# Patient Record
Sex: Female | Born: 1935 | Race: White | Hispanic: No | Smoking: Former smoker
Health system: Southern US, Community
[De-identification: ages and names within clinical notes are randomized; demographics above are authoritative.]

## PROBLEM LIST (undated history)

## (undated) DIAGNOSIS — M1990 Unspecified osteoarthritis, unspecified site: Secondary | ICD-10-CM

## (undated) DIAGNOSIS — H269 Unspecified cataract: Secondary | ICD-10-CM

## (undated) DIAGNOSIS — R040 Epistaxis: Secondary | ICD-10-CM

## (undated) DIAGNOSIS — C801 Malignant (primary) neoplasm, unspecified: Secondary | ICD-10-CM

## (undated) DIAGNOSIS — K284 Chronic or unspecified gastrojejunal ulcer with hemorrhage: Secondary | ICD-10-CM

## (undated) DIAGNOSIS — M79606 Pain in leg, unspecified: Secondary | ICD-10-CM

## (undated) DIAGNOSIS — R413 Other amnesia: Secondary | ICD-10-CM

## (undated) DIAGNOSIS — K745 Biliary cirrhosis, unspecified: Secondary | ICD-10-CM

## (undated) DIAGNOSIS — K274 Chronic or unspecified peptic ulcer, site unspecified, with hemorrhage: Secondary | ICD-10-CM

## (undated) DIAGNOSIS — K219 Gastro-esophageal reflux disease without esophagitis: Secondary | ICD-10-CM

## (undated) DIAGNOSIS — I10 Essential (primary) hypertension: Secondary | ICD-10-CM

## (undated) DIAGNOSIS — I5030 Unspecified diastolic (congestive) heart failure: Secondary | ICD-10-CM

## (undated) DIAGNOSIS — R5383 Other fatigue: Secondary | ICD-10-CM

## (undated) DIAGNOSIS — I712 Thoracic aortic aneurysm, without rupture, unspecified: Secondary | ICD-10-CM

## (undated) DIAGNOSIS — K55039 Acute (reversible) ischemia of large intestine, extent unspecified: Secondary | ICD-10-CM

## (undated) DIAGNOSIS — Z5189 Encounter for other specified aftercare: Secondary | ICD-10-CM

## (undated) DIAGNOSIS — G4733 Obstructive sleep apnea (adult) (pediatric): Secondary | ICD-10-CM

## (undated) DIAGNOSIS — E785 Hyperlipidemia, unspecified: Secondary | ICD-10-CM

## (undated) DIAGNOSIS — S065X9A Traumatic subdural hemorrhage with loss of consciousness of unspecified duration, initial encounter: Secondary | ICD-10-CM

## (undated) DIAGNOSIS — N390 Urinary tract infection, site not specified: Secondary | ICD-10-CM

## (undated) DIAGNOSIS — D649 Anemia, unspecified: Secondary | ICD-10-CM

## (undated) DIAGNOSIS — I714 Abdominal aortic aneurysm, without rupture: Secondary | ICD-10-CM

## (undated) DIAGNOSIS — J189 Pneumonia, unspecified organism: Secondary | ICD-10-CM

## (undated) HISTORY — DX: Other fatigue: R53.83

## (undated) HISTORY — DX: Pain in leg, unspecified: M79.606

## (undated) HISTORY — DX: Gastro-esophageal reflux disease without esophagitis: K21.9

## (undated) HISTORY — DX: Pneumonia, unspecified organism: J18.9

## (undated) HISTORY — DX: Hyperlipidemia, unspecified: E78.5

## (undated) HISTORY — PX: CRANIOTOMY: SHX93

## (undated) HISTORY — DX: Biliary cirrhosis, unspecified: K74.5

## (undated) HISTORY — DX: Epistaxis: R04.0

## (undated) HISTORY — DX: Obstructive sleep apnea (adult) (pediatric): G47.33

## (undated) HISTORY — DX: Abdominal aortic aneurysm, without rupture: I71.4

## (undated) HISTORY — DX: Essential (primary) hypertension: I10

## (undated) HISTORY — PX: CATARACT EXTRACTION: SUR2

## (undated) HISTORY — PX: BREAST BIOPSY: SHX20

## (undated) HISTORY — DX: Chronic or unspecified gastrojejunal ulcer with hemorrhage: K28.4

## (undated) HISTORY — DX: Chronic or unspecified peptic ulcer, site unspecified, with hemorrhage: K27.4

---

## 2006-06-06 ENCOUNTER — Other Ambulatory Visit: Admission: RE | Admit: 2006-06-06 | Discharge: 2006-06-06 | Payer: Self-pay | Admitting: Family Medicine

## 2006-06-06 ENCOUNTER — Ambulatory Visit: Payer: Self-pay | Admitting: Family Medicine

## 2006-06-06 ENCOUNTER — Encounter: Payer: Self-pay | Admitting: Family Medicine

## 2006-06-15 ENCOUNTER — Ambulatory Visit: Payer: Self-pay

## 2006-06-15 ENCOUNTER — Encounter: Payer: Self-pay | Admitting: Cardiology

## 2006-06-20 ENCOUNTER — Ambulatory Visit (HOSPITAL_COMMUNITY): Admission: RE | Admit: 2006-06-20 | Discharge: 2006-06-20 | Payer: Self-pay | Admitting: Family Medicine

## 2006-06-22 ENCOUNTER — Encounter: Admission: RE | Admit: 2006-06-22 | Discharge: 2006-06-22 | Payer: Self-pay | Admitting: Family Medicine

## 2006-06-27 ENCOUNTER — Ambulatory Visit: Payer: Self-pay | Admitting: Family Medicine

## 2006-06-27 LAB — CONVERTED CEMR LAB
ALT: 43 units/L — ABNORMAL HIGH (ref 0–40)
AST: 36 units/L (ref 0–37)
Albumin: 3.4 g/dL — ABNORMAL LOW (ref 3.5–5.2)
Alkaline Phosphatase: 215 units/L — ABNORMAL HIGH (ref 39–117)
BUN: 9 mg/dL (ref 6–23)
Basophils Absolute: 0 10*3/uL (ref 0.0–0.1)
Basophils Relative: 0.3 % (ref 0.0–1.0)
CO2: 30 meq/L (ref 19–32)
Chloride: 102 meq/L (ref 96–112)
Creatinine, Ser: 0.8 mg/dL (ref 0.4–1.2)
HCT: 44.5 % (ref 36.0–46.0)
Hemoglobin: 14.9 g/dL (ref 12.0–15.0)
MCHC: 33.6 g/dL (ref 30.0–36.0)
Monocytes Absolute: 0.5 10*3/uL (ref 0.2–0.7)
Monocytes Relative: 8.1 % (ref 3.0–11.0)
RBC: 5.2 M/uL — ABNORMAL HIGH (ref 3.87–5.11)
RDW: 13.3 % (ref 11.5–14.6)
Total Bilirubin: 0.8 mg/dL (ref 0.3–1.2)
Total Protein: 7.6 g/dL (ref 6.0–8.3)

## 2006-07-04 ENCOUNTER — Ambulatory Visit: Payer: Self-pay | Admitting: Family Medicine

## 2006-07-04 LAB — CONVERTED CEMR LAB
AST: 43 units/L — ABNORMAL HIGH (ref 0–37)
Hep A IgM: NEGATIVE
Hep B C IgM: NEGATIVE

## 2006-07-05 ENCOUNTER — Ambulatory Visit: Payer: Self-pay | Admitting: Family Medicine

## 2006-07-13 ENCOUNTER — Ambulatory Visit: Payer: Self-pay | Admitting: Gastroenterology

## 2006-07-19 ENCOUNTER — Ambulatory Visit: Payer: Self-pay | Admitting: Family Medicine

## 2006-07-19 LAB — CONVERTED CEMR LAB
Albumin: 3.3 g/dL — ABNORMAL LOW (ref 3.5–5.2)
Alkaline Phosphatase: 206 units/L — ABNORMAL HIGH (ref 39–117)
GGT: 425 units/L — ABNORMAL HIGH (ref 7–51)

## 2006-07-21 ENCOUNTER — Ambulatory Visit: Payer: Self-pay | Admitting: Family Medicine

## 2006-07-21 ENCOUNTER — Ambulatory Visit: Payer: Self-pay | Admitting: Cardiology

## 2006-08-21 ENCOUNTER — Ambulatory Visit: Payer: Self-pay | Admitting: Vascular Surgery

## 2006-08-21 ENCOUNTER — Encounter: Payer: Self-pay | Admitting: Family Medicine

## 2006-11-06 ENCOUNTER — Ambulatory Visit: Payer: Self-pay | Admitting: Family Medicine

## 2006-11-06 DIAGNOSIS — I714 Abdominal aortic aneurysm, without rupture, unspecified: Secondary | ICD-10-CM | POA: Insufficient documentation

## 2006-11-06 DIAGNOSIS — I10 Essential (primary) hypertension: Secondary | ICD-10-CM | POA: Insufficient documentation

## 2006-11-20 ENCOUNTER — Ambulatory Visit: Payer: Self-pay | Admitting: Family Medicine

## 2006-11-22 LAB — CONVERTED CEMR LAB
BUN: 14 mg/dL (ref 6–23)
CO2: 30 meq/L (ref 19–32)
Calcium: 9.7 mg/dL (ref 8.4–10.5)
Creatinine, Ser: 0.8 mg/dL (ref 0.4–1.2)
GFR calc Af Amer: 90 mL/min

## 2007-02-21 ENCOUNTER — Ambulatory Visit: Payer: Self-pay | Admitting: Family Medicine

## 2007-02-27 ENCOUNTER — Ambulatory Visit: Payer: Self-pay | Admitting: Family Medicine

## 2007-03-02 LAB — CONVERTED CEMR LAB
ALT: 32 units/L (ref 0–35)
AST: 32 units/L (ref 0–37)
Albumin: 3.4 g/dL — ABNORMAL LOW (ref 3.5–5.2)
Alkaline Phosphatase: 180 units/L — ABNORMAL HIGH (ref 39–117)
BUN: 15 mg/dL (ref 6–23)
Basophils Absolute: 0 10*3/uL (ref 0.0–0.1)
Calcium: 9.8 mg/dL (ref 8.4–10.5)
Chloride: 104 meq/L (ref 96–112)
Cholesterol: 228 mg/dL (ref 0–200)
Eosinophils Absolute: 0 10*3/uL (ref 0.0–0.6)
Eosinophils Relative: 0.2 % (ref 0.0–5.0)
GFR calc Af Amer: 78 mL/min
GFR calc non Af Amer: 65 mL/min
Glucose, Bld: 90 mg/dL (ref 70–99)
HDL: 29.3 mg/dL — ABNORMAL LOW (ref >39.0)
MCV: 87.2 fL (ref 78.0–100.0)
Monocytes Relative: 7.4 % (ref 3.0–11.0)
Neutro Abs: 3.7 10*3/uL (ref 1.4–7.7)
Platelets: 211 10*3/uL (ref 150–400)
RBC: 4.81 M/uL (ref 3.87–5.11)
Triglycerides: 175 mg/dL — ABNORMAL HIGH (ref 0–149)
VLDL: 35 mg/dL (ref 0–40)
WBC: 6.9 10*3/uL (ref 4.5–10.5)

## 2007-03-07 ENCOUNTER — Encounter (INDEPENDENT_AMBULATORY_CARE_PROVIDER_SITE_OTHER): Payer: Self-pay | Admitting: *Deleted

## 2007-03-16 ENCOUNTER — Encounter: Payer: Self-pay | Admitting: Family Medicine

## 2007-03-16 ENCOUNTER — Ambulatory Visit: Payer: Self-pay | Admitting: Vascular Surgery

## 2007-06-13 ENCOUNTER — Ambulatory Visit: Payer: Self-pay | Admitting: Family Medicine

## 2007-06-13 DIAGNOSIS — E785 Hyperlipidemia, unspecified: Secondary | ICD-10-CM | POA: Insufficient documentation

## 2007-06-14 ENCOUNTER — Encounter: Payer: Self-pay | Admitting: Family Medicine

## 2007-06-19 ENCOUNTER — Telehealth (INDEPENDENT_AMBULATORY_CARE_PROVIDER_SITE_OTHER): Payer: Self-pay | Admitting: *Deleted

## 2007-06-19 LAB — CONVERTED CEMR LAB
AST: 26 units/L (ref 0–37)
Albumin: 3.7 g/dL (ref 3.5–5.2)
Basophils Absolute: 0 10*3/uL (ref 0.0–0.1)
Bilirubin, Direct: 0.1 mg/dL (ref 0.0–0.3)
Chloride: 106 meq/L (ref 96–112)
Eosinophils Absolute: 0 10*3/uL (ref 0.0–0.6)
Eosinophils Relative: 0.2 % (ref 0.0–5.0)
GFR calc non Af Amer: 74 mL/min
Glucose, Bld: 94 mg/dL (ref 70–99)
HCT: 45.3 % (ref 36.0–46.0)
Hemoglobin: 14.6 g/dL (ref 12.0–15.0)
Lymphocytes Relative: 36.1 % (ref 12.0–46.0)
MCHC: 32.3 g/dL (ref 30.0–36.0)
MCV: 88.8 fL (ref 78.0–100.0)
Monocytes Absolute: 0.6 10*3/uL (ref 0.2–0.7)
Neutrophils Relative %: 55.4 % (ref 43.0–77.0)
Potassium: 4.6 meq/L (ref 3.5–5.1)
RBC: 5.1 M/uL (ref 3.87–5.11)
Sodium: 141 meq/L (ref 135–145)
TSH: 1.58 microintl units/mL (ref 0.35–5.50)
Total Bilirubin: 0.9 mg/dL (ref 0.3–1.2)
WBC: 8.1 10*3/uL (ref 4.5–10.5)

## 2007-06-25 ENCOUNTER — Encounter (INDEPENDENT_AMBULATORY_CARE_PROVIDER_SITE_OTHER): Payer: Self-pay | Admitting: *Deleted

## 2007-07-04 ENCOUNTER — Ambulatory Visit: Payer: Self-pay | Admitting: Family Medicine

## 2007-07-04 DIAGNOSIS — R945 Abnormal results of liver function studies: Secondary | ICD-10-CM | POA: Insufficient documentation

## 2007-07-04 LAB — CONVERTED CEMR LAB
Albumin: 3.5 g/dL (ref 3.5–5.2)
Alkaline Phosphatase: 217 units/L — ABNORMAL HIGH (ref 39–117)
GGT: 327 units/L — ABNORMAL HIGH (ref 7–51)
Total Bilirubin: 0.7 mg/dL (ref 0.3–1.2)

## 2007-07-16 ENCOUNTER — Telehealth (INDEPENDENT_AMBULATORY_CARE_PROVIDER_SITE_OTHER): Payer: Self-pay | Admitting: *Deleted

## 2007-07-30 ENCOUNTER — Ambulatory Visit: Payer: Self-pay | Admitting: Family Medicine

## 2007-08-01 ENCOUNTER — Encounter (INDEPENDENT_AMBULATORY_CARE_PROVIDER_SITE_OTHER): Payer: Self-pay | Admitting: *Deleted

## 2007-08-01 LAB — CONVERTED CEMR LAB
AST: 29 units/L (ref 0–37)
Alkaline Phosphatase: 187 units/L — ABNORMAL HIGH (ref 39–117)
Bilirubin, Direct: 0.1 mg/dL (ref 0.0–0.3)

## 2007-09-25 ENCOUNTER — Ambulatory Visit: Payer: Self-pay | Admitting: Family Medicine

## 2007-09-26 ENCOUNTER — Ambulatory Visit: Payer: Self-pay | Admitting: Family Medicine

## 2007-10-03 LAB — CONVERTED CEMR LAB
AST: 32 units/L (ref 0–37)
Albumin: 3.4 g/dL — ABNORMAL LOW (ref 3.5–5.2)
Alkaline Phosphatase: 203 units/L — ABNORMAL HIGH (ref 39–117)
Bilirubin, Direct: 0.1 mg/dL (ref 0.0–0.3)
Chloride: 103 meq/L (ref 96–112)
GFR calc Af Amer: 78 mL/min
GFR calc non Af Amer: 65 mL/min
HDL: 27.6 mg/dL — ABNORMAL LOW (ref >39.0)
Potassium: 5.2 meq/L — ABNORMAL HIGH (ref 3.5–5.1)
Sodium: 137 meq/L (ref 135–145)
Total Bilirubin: 0.7 mg/dL (ref 0.3–1.2)
VLDL: 26 mg/dL (ref 0–40)

## 2007-10-04 ENCOUNTER — Encounter (INDEPENDENT_AMBULATORY_CARE_PROVIDER_SITE_OTHER): Payer: Self-pay | Admitting: *Deleted

## 2007-10-04 ENCOUNTER — Telehealth (INDEPENDENT_AMBULATORY_CARE_PROVIDER_SITE_OTHER): Payer: Self-pay | Admitting: *Deleted

## 2007-12-05 ENCOUNTER — Ambulatory Visit: Payer: Self-pay | Admitting: Gastroenterology

## 2007-12-05 DIAGNOSIS — R740 Nonspecific elevation of levels of transaminase and lactic acid dehydrogenase [LDH]: Secondary | ICD-10-CM

## 2007-12-05 DIAGNOSIS — J189 Pneumonia, unspecified organism: Secondary | ICD-10-CM | POA: Insufficient documentation

## 2007-12-05 LAB — CONVERTED CEMR LAB
AST: 25 units/L (ref 0–37)
Alkaline Phosphatase: 183 units/L — ABNORMAL HIGH (ref 39–117)
Bilirubin, Direct: 0.1 mg/dL (ref 0.0–0.3)
Ferritin: 131.2 ng/mL (ref 10.0–291.0)
INR: 1 (ref 0.8–1.0)
Iron: 103 ug/dL (ref 42–145)
Total Bilirubin: 0.8 mg/dL (ref 0.3–1.2)

## 2007-12-11 LAB — CONVERTED CEMR LAB
A-1 Antitrypsin, Ser: 202 mg/dL — ABNORMAL HIGH (ref 83–200)
Hep B S Ab: NEGATIVE
Hepatitis B Surface Ag: NEGATIVE

## 2007-12-21 ENCOUNTER — Encounter (INDEPENDENT_AMBULATORY_CARE_PROVIDER_SITE_OTHER): Payer: Self-pay | Admitting: *Deleted

## 2007-12-27 ENCOUNTER — Ambulatory Visit: Payer: Self-pay | Admitting: Gastroenterology

## 2007-12-27 DIAGNOSIS — K219 Gastro-esophageal reflux disease without esophagitis: Secondary | ICD-10-CM | POA: Insufficient documentation

## 2007-12-28 LAB — CONVERTED CEMR LAB: Creatinine, Ser: 0.7 mg/dL (ref 0.4–1.2)

## 2008-01-07 ENCOUNTER — Ambulatory Visit (HOSPITAL_COMMUNITY): Admission: RE | Admit: 2008-01-07 | Discharge: 2008-01-07 | Payer: Self-pay | Admitting: Gastroenterology

## 2008-01-08 ENCOUNTER — Telehealth: Payer: Self-pay | Admitting: Gastroenterology

## 2008-01-09 ENCOUNTER — Ambulatory Visit: Payer: Self-pay | Admitting: Vascular Surgery

## 2008-01-11 ENCOUNTER — Telehealth: Payer: Self-pay | Admitting: Gastroenterology

## 2008-01-18 ENCOUNTER — Ambulatory Visit (HOSPITAL_COMMUNITY): Admission: RE | Admit: 2008-01-18 | Discharge: 2008-01-18 | Payer: Self-pay | Admitting: Gastroenterology

## 2008-01-18 ENCOUNTER — Encounter (INDEPENDENT_AMBULATORY_CARE_PROVIDER_SITE_OTHER): Payer: Self-pay | Admitting: Interventional Radiology

## 2008-01-21 ENCOUNTER — Encounter: Payer: Self-pay | Admitting: Gastroenterology

## 2008-02-07 ENCOUNTER — Telehealth: Payer: Self-pay | Admitting: Gastroenterology

## 2008-02-25 ENCOUNTER — Telehealth (INDEPENDENT_AMBULATORY_CARE_PROVIDER_SITE_OTHER): Payer: Self-pay | Admitting: *Deleted

## 2008-03-05 ENCOUNTER — Encounter: Payer: Self-pay | Admitting: Gastroenterology

## 2008-03-31 ENCOUNTER — Telehealth (INDEPENDENT_AMBULATORY_CARE_PROVIDER_SITE_OTHER): Payer: Self-pay | Admitting: *Deleted

## 2008-04-08 ENCOUNTER — Ambulatory Visit: Payer: Self-pay | Admitting: Family Medicine

## 2008-04-08 DIAGNOSIS — M79609 Pain in unspecified limb: Secondary | ICD-10-CM | POA: Insufficient documentation

## 2008-04-24 LAB — CONVERTED CEMR LAB
Albumin: 3.7 g/dL (ref 3.5–5.2)
Alkaline Phosphatase: 128 units/L — ABNORMAL HIGH (ref 39–117)
BUN: 15 mg/dL (ref 6–23)
Creatinine, Ser: 0.8 mg/dL (ref 0.4–1.2)
Direct LDL: 139.5 mg/dL
GFR calc Af Amer: 90 mL/min
GFR calc non Af Amer: 74 mL/min
HDL: 33 mg/dL — ABNORMAL LOW (ref >39.0)
Potassium: 4.7 meq/L (ref 3.5–5.1)
Total CHOL/HDL Ratio: 7.2
VLDL: 46 mg/dL — ABNORMAL HIGH (ref 0–40)

## 2008-04-25 ENCOUNTER — Telehealth (INDEPENDENT_AMBULATORY_CARE_PROVIDER_SITE_OTHER): Payer: Self-pay | Admitting: *Deleted

## 2008-04-25 ENCOUNTER — Encounter (INDEPENDENT_AMBULATORY_CARE_PROVIDER_SITE_OTHER): Payer: Self-pay | Admitting: *Deleted

## 2008-05-07 ENCOUNTER — Encounter: Payer: Self-pay | Admitting: Gastroenterology

## 2008-05-30 ENCOUNTER — Telehealth (INDEPENDENT_AMBULATORY_CARE_PROVIDER_SITE_OTHER): Payer: Self-pay | Admitting: *Deleted

## 2008-06-19 ENCOUNTER — Encounter (INDEPENDENT_AMBULATORY_CARE_PROVIDER_SITE_OTHER): Payer: Self-pay | Admitting: *Deleted

## 2008-07-23 ENCOUNTER — Ambulatory Visit: Payer: Self-pay | Admitting: Family Medicine

## 2008-07-27 LAB — CONVERTED CEMR LAB
ALT: 17 units/L (ref 0–35)
Direct LDL: 128.8 mg/dL
HDL: 32.8 mg/dL — ABNORMAL LOW (ref >39.00)
Total Protein: 7.3 g/dL (ref 6.0–8.3)
Triglycerides: 151 mg/dL — ABNORMAL HIGH (ref 0.0–149.0)

## 2008-07-28 ENCOUNTER — Encounter (INDEPENDENT_AMBULATORY_CARE_PROVIDER_SITE_OTHER): Payer: Self-pay | Admitting: *Deleted

## 2008-07-28 ENCOUNTER — Telehealth (INDEPENDENT_AMBULATORY_CARE_PROVIDER_SITE_OTHER): Payer: Self-pay | Admitting: *Deleted

## 2008-07-30 ENCOUNTER — Telehealth (INDEPENDENT_AMBULATORY_CARE_PROVIDER_SITE_OTHER): Payer: Self-pay | Admitting: *Deleted

## 2008-08-26 ENCOUNTER — Telehealth (INDEPENDENT_AMBULATORY_CARE_PROVIDER_SITE_OTHER): Payer: Self-pay | Admitting: *Deleted

## 2008-11-05 ENCOUNTER — Encounter: Payer: Self-pay | Admitting: Gastroenterology

## 2008-11-11 ENCOUNTER — Ambulatory Visit: Payer: Self-pay | Admitting: Family Medicine

## 2008-11-14 ENCOUNTER — Telehealth (INDEPENDENT_AMBULATORY_CARE_PROVIDER_SITE_OTHER): Payer: Self-pay | Admitting: *Deleted

## 2008-11-14 ENCOUNTER — Encounter: Payer: Self-pay | Admitting: Family Medicine

## 2008-11-17 ENCOUNTER — Encounter (INDEPENDENT_AMBULATORY_CARE_PROVIDER_SITE_OTHER): Payer: Self-pay | Admitting: *Deleted

## 2008-11-18 ENCOUNTER — Encounter: Admission: RE | Admit: 2008-11-18 | Discharge: 2008-11-18 | Payer: Self-pay | Admitting: Family Medicine

## 2008-11-18 LAB — CONVERTED CEMR LAB
ALT: 20 units/L (ref 0–35)
BUN: 17 mg/dL (ref 6–23)
Bilirubin, Direct: 0 mg/dL (ref 0.0–0.3)
CO2: 28 meq/L (ref 19–32)
Chloride: 105 meq/L (ref 96–112)
Cholesterol: 192 mg/dL (ref 0–200)
Creatinine, Ser: 0.8 mg/dL (ref 0.4–1.2)
Direct LDL: 103.8 mg/dL
Total Protein: 7.6 g/dL (ref 6.0–8.3)

## 2008-11-19 ENCOUNTER — Ambulatory Visit: Payer: Self-pay | Admitting: Pulmonary Disease

## 2008-11-20 ENCOUNTER — Encounter: Payer: Self-pay | Admitting: Pulmonary Disease

## 2008-11-27 ENCOUNTER — Ambulatory Visit (HOSPITAL_BASED_OUTPATIENT_CLINIC_OR_DEPARTMENT_OTHER): Admission: RE | Admit: 2008-11-27 | Discharge: 2008-11-27 | Payer: Self-pay | Admitting: Pulmonary Disease

## 2008-11-27 ENCOUNTER — Encounter: Payer: Self-pay | Admitting: Pulmonary Disease

## 2008-12-14 ENCOUNTER — Ambulatory Visit: Payer: Self-pay | Admitting: Pulmonary Disease

## 2008-12-15 ENCOUNTER — Telehealth (INDEPENDENT_AMBULATORY_CARE_PROVIDER_SITE_OTHER): Payer: Self-pay | Admitting: *Deleted

## 2008-12-19 ENCOUNTER — Ambulatory Visit: Payer: Self-pay | Admitting: Pulmonary Disease

## 2008-12-19 DIAGNOSIS — G4733 Obstructive sleep apnea (adult) (pediatric): Secondary | ICD-10-CM | POA: Insufficient documentation

## 2009-01-09 ENCOUNTER — Ambulatory Visit: Payer: Self-pay | Admitting: Vascular Surgery

## 2009-01-09 ENCOUNTER — Encounter: Payer: Self-pay | Admitting: Family Medicine

## 2009-01-21 ENCOUNTER — Encounter (INDEPENDENT_AMBULATORY_CARE_PROVIDER_SITE_OTHER): Payer: Self-pay | Admitting: *Deleted

## 2009-04-04 DIAGNOSIS — I714 Abdominal aortic aneurysm, without rupture, unspecified: Secondary | ICD-10-CM

## 2009-04-04 HISTORY — DX: Abdominal aortic aneurysm, without rupture: I71.4

## 2009-04-04 HISTORY — DX: Abdominal aortic aneurysm, without rupture, unspecified: I71.40

## 2009-04-04 HISTORY — PX: ILIAC ARTERY - FEMORAL ARTERY BYPASS GRAFT: SUR182

## 2009-06-12 ENCOUNTER — Ambulatory Visit: Payer: Self-pay | Admitting: Family Medicine

## 2009-06-25 LAB — CONVERTED CEMR LAB
Alkaline Phosphatase: 162 units/L — ABNORMAL HIGH (ref 39–117)
Bilirubin, Direct: 0 mg/dL (ref 0.0–0.3)
Cholesterol: 130 mg/dL (ref 0–200)
LDL Cholesterol: 63 mg/dL (ref 0–99)
Total Protein: 8.5 g/dL — ABNORMAL HIGH (ref 6.0–8.3)

## 2009-06-26 ENCOUNTER — Ambulatory Visit: Payer: Self-pay | Admitting: Family Medicine

## 2009-06-26 DIAGNOSIS — R5383 Other fatigue: Secondary | ICD-10-CM

## 2009-06-26 DIAGNOSIS — R040 Epistaxis: Secondary | ICD-10-CM | POA: Insufficient documentation

## 2009-06-26 DIAGNOSIS — R5381 Other malaise: Secondary | ICD-10-CM | POA: Insufficient documentation

## 2009-06-30 LAB — CONVERTED CEMR LAB
Basophils Absolute: 0.1 10*3/uL (ref 0.0–0.1)
CO2: 29 meq/L (ref 19–32)
Calcium: 9.5 mg/dL (ref 8.4–10.5)
Chloride: 104 meq/L (ref 96–112)
Eosinophils Absolute: 0 10*3/uL (ref 0.0–0.7)
Glucose, Bld: 87 mg/dL (ref 70–99)
HCT: 42.1 % (ref 36.0–46.0)
Hemoglobin: 13.7 g/dL (ref 12.0–15.0)
Lymphocytes Relative: 36.3 % (ref 12.0–46.0)
Lymphs Abs: 2.7 10*3/uL (ref 0.7–4.0)
MCHC: 32.5 g/dL (ref 30.0–36.0)
MCV: 89.6 fL (ref 78.0–100.0)
Monocytes Absolute: 0.5 10*3/uL (ref 0.1–1.0)
Neutro Abs: 4.1 10*3/uL (ref 1.4–7.7)
RDW: 13.5 % (ref 11.5–14.6)
Sodium: 142 meq/L (ref 135–145)

## 2009-12-10 ENCOUNTER — Encounter: Payer: Self-pay | Admitting: Family Medicine

## 2010-01-08 ENCOUNTER — Ambulatory Visit: Payer: Self-pay | Admitting: Family Medicine

## 2010-01-08 DIAGNOSIS — K745 Biliary cirrhosis, unspecified: Secondary | ICD-10-CM | POA: Insufficient documentation

## 2010-01-22 ENCOUNTER — Ambulatory Visit: Payer: Self-pay | Admitting: Family Medicine

## 2010-01-26 ENCOUNTER — Encounter: Admission: RE | Admit: 2010-01-26 | Discharge: 2010-01-26 | Payer: Self-pay | Admitting: Vascular Surgery

## 2010-01-26 ENCOUNTER — Ambulatory Visit: Payer: Self-pay | Admitting: Vascular Surgery

## 2010-01-28 ENCOUNTER — Telehealth (INDEPENDENT_AMBULATORY_CARE_PROVIDER_SITE_OTHER): Payer: Self-pay | Admitting: *Deleted

## 2010-01-29 ENCOUNTER — Ambulatory Visit: Payer: Self-pay

## 2010-01-29 ENCOUNTER — Ambulatory Visit: Payer: Self-pay | Admitting: Internal Medicine

## 2010-01-29 ENCOUNTER — Ambulatory Visit (HOSPITAL_COMMUNITY): Admission: RE | Admit: 2010-01-29 | Discharge: 2010-01-29 | Payer: Self-pay | Admitting: Cardiovascular Disease

## 2010-01-29 ENCOUNTER — Ambulatory Visit: Payer: Self-pay | Admitting: Cardiovascular Disease

## 2010-01-29 ENCOUNTER — Telehealth (INDEPENDENT_AMBULATORY_CARE_PROVIDER_SITE_OTHER): Payer: Self-pay | Admitting: *Deleted

## 2010-01-29 DIAGNOSIS — R9431 Abnormal electrocardiogram [ECG] [EKG]: Secondary | ICD-10-CM | POA: Insufficient documentation

## 2010-01-29 DIAGNOSIS — R011 Cardiac murmur, unspecified: Secondary | ICD-10-CM | POA: Insufficient documentation

## 2010-01-29 DIAGNOSIS — F17200 Nicotine dependence, unspecified, uncomplicated: Secondary | ICD-10-CM | POA: Insufficient documentation

## 2010-02-01 ENCOUNTER — Inpatient Hospital Stay (HOSPITAL_COMMUNITY): Admission: RE | Admit: 2010-02-01 | Discharge: 2010-02-05 | Payer: Self-pay | Admitting: Vascular Surgery

## 2010-02-01 ENCOUNTER — Encounter: Payer: Self-pay | Admitting: Vascular Surgery

## 2010-02-01 ENCOUNTER — Ambulatory Visit: Payer: Self-pay | Admitting: Vascular Surgery

## 2010-02-01 HISTORY — PX: ABDOMINAL AORTIC ANEURYSM REPAIR: SUR1152

## 2010-02-03 ENCOUNTER — Telehealth (INDEPENDENT_AMBULATORY_CARE_PROVIDER_SITE_OTHER): Payer: Self-pay | Admitting: *Deleted

## 2010-02-08 ENCOUNTER — Telehealth: Payer: Self-pay | Admitting: Family Medicine

## 2010-02-23 ENCOUNTER — Encounter: Payer: Self-pay | Admitting: Cardiovascular Disease

## 2010-02-23 ENCOUNTER — Ambulatory Visit: Payer: Self-pay | Admitting: Vascular Surgery

## 2010-03-12 ENCOUNTER — Ambulatory Visit: Payer: Self-pay | Admitting: Family Medicine

## 2010-04-23 ENCOUNTER — Ambulatory Visit
Admission: RE | Admit: 2010-04-23 | Discharge: 2010-04-23 | Payer: Self-pay | Source: Home / Self Care | Attending: Cardiovascular Disease | Admitting: Cardiovascular Disease

## 2010-04-25 ENCOUNTER — Encounter: Payer: Self-pay | Admitting: Gastroenterology

## 2010-05-04 NOTE — Progress Notes (Signed)
Summary: lab result/ ov needed  Phone Note Call from Patient Call back at [phone]   Caller: Meredith Pel Summary of Call: Pt daughter called stating that pt just got out of the hospital a few days ago from having surgery on a aneurysm. Pt daughter would like to know how urgent results are and if it is something that needs to be address now or can it wait until pt has recovery  to schedule OV to review. OV was cancelled for tomorrow. Pls advise..........Marland KitchenFelecia Deloach CMA  February 08, 2010 12:20 PM   Follow-up for Phone Call        we need to add zetia 10 mg #30 1 by mouth once daily ---we can leave samples if they like---then I can go over labs in detail when she is able----we will need to recheck labs in 3 months Follow-up by: Loreen Freud DO,  February 08, 2010 5:03 PM  Additional Follow-up for Phone Call Additional follow up Details #1::        pt daughter aware samples placed up front...........Marland KitchenFelecia Deloach CMA  February 09, 2010 10:26 AM     New/Updated Medications: ZETIA 10 MG TABS (EZETIMIBE) Take 1 by mouth once daily Prescriptions: ZETIA 10 MG TABS (EZETIMIBE) Take 1 by mouth once daily  #30 x 0   Entered by:   Jeremy Johann CMA   Authorized by:   Loreen Freud DO   Signed by:   Jeremy Johann CMA on 02/09/2010   Method used:   Samples Given   RxID:   [id number]

## 2010-05-04 NOTE — Progress Notes (Signed)
Summary: Copy of previous results mailed   Phone Note Call from Patient   Caller: Patient Reason for Call: Lab or Test Results Details for Reason: Pt wants previous labs Summary of Call: Mssg from pt stating that she had to cancel her upcoming appt. Wanted to get a copy of her previous labs sent to her home address. Copy of 3/11 labs mailed to pt 01/28/10...Marland KitchenMarland KitchenMarland Kitchen Almeta Monas CMA Duncan Dull)  January 28, 2010 8:12 AM

## 2010-05-04 NOTE — Progress Notes (Signed)
Summary: Pt needs an appt to Review Labs 11/2  Phone Note Outgoing Call   Call placed by: Almeta Monas CMA Duncan Dull),  February 03, 2010 2:32 PM Call placed to: Patient Details for Reason: Pt needs an appt for F/U Summary of Call: This pt needs to have an appt scheduled to have [facility] reviewed.Marland KitchenMarland KitchenMarland KitchenShe needs an appt soon per Dr.Lowne. Left message to call back.... Almeta Monas CMA Duncan Dull)  February 03, 2010 2:33 PM   Called pt and spoke with daughter, pt just got out of the hospital today from having surgery on a aneurysm. Adv she needs to come in for an OV with Dr.Lowne. Scheduled for Tues the 8th @ 8:15.Marland Kitchen... Almeta Monas CMA Duncan Dull)  February 05, 2010 4:51 PM

## 2010-05-04 NOTE — Assessment & Plan Note (Signed)
Summary: SURG CLEARENCE/MT   Referring Provider:  Loreen Freud Primary Provider:  Janit Bern   History of Present Illness: Deanna [name] is seen today at the request of Dr Early.  She has a known AAA that has grown to over 5.5 cm and has been symptomatic the last week with abdomianl pain.  She is scheduled to have surgery Monday.  She denies a history of CAD, SSCP, palpitations or syncope.  I reviewed her ECG from Dr Bosie Helper office, and one from 3/09 and our office today.  There are no changes in over 2 years with poor R wave progression and LAD.  Cannot R/O previous IMI or AMI.  She is active with good funcitonal capacity and no symptoms.  CRF's include smoking, HTN and elevated lipids.  Counseled her on smoking cessation and she surprisingly didn't see the relationship between her smoking and AAA.  She is compliant with her meds.  Her AAA is very worrisome as she is tender and there has been an acute change in size.  She is asymptomatic with good functional capacity with a stable ECG over 2 years.  Ideally she would have a pharmocalogic stress test prior to surgery but this may even be dangerous at this point.  I personally discussed this case with Dr Arbie Cookey, the patient and daughter.  I think the best approach is to try to do a quick echo in the office now and if the EF is normal we will clear her for surgery.  The AAA will not involve the thoracolumbar approach but she is not a candidate for stenting.  No benefit to starting BB this close to surgery.    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) 5)  Fatigue  (ICD-780.79) 6)  Epistaxis  (ICD-784.7) 7)  Obstructive Sleep Apnea  (ICD-327.23) 8)  Leg Pain, Bilateral  (ICD-729.5) 9)  Gerd  (ICD-530.81) 10)  Nonspecific  Abnormal Results Livr Function Study  (ICD-794.8) 11)  Screening Colorectal-cancer  (ICD-V76.51) 12)  Alanine Aminotransferase, Serum, Elevated  (ICD-790.4) 13)  Pneumonia  (ICD-486) 14)  Liver Function Tests, Abnormal  (ICD-794.8) 15)  Preventive Health Care  (ICD-V70.0) 16)  Abdominal Aneurysm Without Mention of Rupture  (ICD-441.4) 17)  Contusion, Face Lip  (ICD-920)  Current Medications (verified): 1)  Norvasc 10 Mg Tabs (Amlodipine Besylate) .... Will Pick Today Takes 1 Tab By Mouth Once Daily 2)  Vit-E .Marland Kitchen.. 1 By Mouth Once Daily 3)  Vit-C .Marland Kitchen.. 1 By Mouth Once Daily 4)  Calcium .Marland Kitchen.. 1 By Mouth Once Daily 5)  Vit-D-3 .Marland Kitchen.. 1 By Mouth Once Daily 6)  Fish Oil .... 2 By Mouth Two Times A Day 7)  Folic Acid .Marland Kitchen.. 1 By Mouth One Time A Day 8)  Co-Q10 .Marland Kitchen.. 1 By Mouth Once Daily 9)  Vit-A .Marland Kitchen.. 1 By Mouth Once Daily 10)  Bayer Low Strength 81 Mg Tbec (Aspirin) .... One Tablet By Mouth Every Day 11)  Vitamin B-12 1000 Mcg Tabs (Cyanocobalamin) .Marland Kitchen.. 1 Once Daily 12)  Cvs Vitamin B-1 100 Mg Tabs (Thiamine Hcl) .... One Tablet By Mouth Once Daily 13)  Fish Oil 1000 Mg Caps (Omega-3 Fatty Acids) .... 2 By Mouth Two Times A Day 14)  Zocor 20 Mg Tabs (Simvastatin) .Marland Kitchen.. 1 By Mouth At Bedtime 15)  Fenofibrate 160  Mg Tabs (Fenofibrate) .... Take 1 By Mouth Once Daily 16)  Zostavax 04540 Unt/0.39ml Solr (Zoster Vaccine Live) .Marland Kitchen.. 1 Ml Im X 1  Allergies (verified): No Known Drug Allergies  Past History:  Past Medical History: Last updated: 01/28/2010 HYPERLIPIDEMIA HYPERTENSION ANEURYSM, ABDOMINAL AORTIC BILIARY CIRRHOSIS, PRIMARY  FATIGUE EPISTAXIS  OBSTRUCTIVE SLEEP APNEA  LEG PAIN, BILATERAL  GERD NONSPECIFIC ABNORMAL RESULTS LIVR FUNCTION STUDY SCREENING COLORECTAL-CANCER  ALANINE AMINOTRANSFERASE, SERUM, ELEVATED PNEUMONIA LIVER FUNCTION TESTS, ABNORMAL  PREVENTIVE HEALTH CARE ABDOMINAL ANEURYSM WITHOUT MENTION OF RUPTURE CONTUSION, FACE LIP  Ulcer with bleed - 2000  Past  Surgical History: Last updated: 2007/12/15 Cataract Extraction OU L breast biopsy-benign 1994  Family History: Last updated: December 15, 2007 Father: DECEASED Mother: DECEASED Siblings: 1 BRO-LIVING, 1 BRO-DECEASED, 1 SIS-LIVING, 1 SIS-DECEASED No FH of Colon Cancer:  Social History: Last updated: 11/19/2008 Married-- widow with children Current Smoke 8 cigs per day started 1960 Alcohol use-no Drug use-no Regular exercise-yes retired IT consultant  Review of Systems       Denies fever, malais, weight loss, blurry vision, decreased visual acuity, cough, sputum, SOB, hemoptysis, pleuritic pain, palpitaitons, heartburn, abdominal pain, melena, lower extremity edema, claudication, or rash.   Vital Signs:  Patient profile:   77 year old female Height:      68 inches Weight:      192 pounds BMI:     29.30 Pulse rate:   82 / minute Resp:     14 per minute BP sitting:   110 / 80  (left arm)  Vitals Entered By: Kem Parkinson (January 29, 2010 4:25 PM)  Physical Exam  General:  Affect appropriate Healthy:  appears stated age HEENT: normal Neck supple with no adenopathy JVP normal no bruits no thyromegaly Lungs clear with no wheezing and good diaphragmatic motion Heart:  S1/S2 SEM mild AR  murmur,rub, gallop or click PMI normal Abdomen: benighn, BS positve, palpable tender AAA no bruit.  No HSM or HJR Distal pulses intact with no bruits No edema Neuro non-focal Skin warm and dry    Impression & Recommendations:  Problem # 1:  ANEURYSM, ABDOMINAL AORTIC (ICD-441.4) Preop clearance for vascular surgery.  Progressive symptomatic AAA takes presidence over asymtomatic abnormal ECG in female with good funcitonal capacity  Echo in office today shows normal EF with no RWMA's  Clear for surgery Monday given the symptomatic nature of her AAA  Problem # 2:  HYPERTENSION (ICD-401.9) Well controlled Her updated medication list for this problem includes:    Norvasc 10 Mg Tabs  (Amlodipine besylate) .Marland Kitchen... Will pick today takes 1 tab by mouth once daily    Bayer Low Strength 81 Mg Tbec (Aspirin) ..... One tablet by mouth every day  Orders: Echocardiogram (Echo)  Problem # 3:  HYPERLIPIDEMIA (ICD-272.4) Continue 2 drug Rx with known vascular disease target LDL under 70 Her updated medication list for this problem includes:    Zocor 20 Mg Tabs (Simvastatin) .Marland Kitchen... 1 by mouth at bedtime    Fenofibrate 160 Mg Tabs (Fenofibrate) .Marland Kitchen... Take 1 by mouth once daily  Problem # 4:  ELECTROCARDIOGRAM, ABNORMAL (ICD-794.31) Check echo today.  R/O RWMA's stable since 06/2007 no acute changes  Reviewed echo just done in office EF normal 60% with abnormal septal motion and no discete RWMA's.  Mild AR and AV slcerosis.   Her updated medication list for this problem includes:    Norvasc 10 Mg Tabs (Amlodipine besylate) .Marland Kitchen... Will pick today takes 1 tab by mouth once daily  Bayer Low Strength 81 Mg Tbec (Aspirin) ..... One tablet by mouth every day  Problem # 5:  SMOKER (ICD-305.1) Hopefully will quit diuring convalescence from AAA.  Risk of cancer and link to vascualr disease discussed.  Counseled for less than 10 minutes  Problem # 6:  MURMUR (ICD-785.2) AV sclerosis and mild AR by limited echo.  F/U in a year  Patient Instructions: 1)  Your physician recommends that you schedule a follow-up appointment in: 3 MONTHS   EKG Report  Procedure date:  01/29/2010  Findings:      NSR 85 LAD IMI AMI No change from 3/09  May be poor R wave progression and LAD

## 2010-05-04 NOTE — Progress Notes (Signed)
Summary: Refill Request  Phone Note Refill Request Message from:  Pharmacy on January 29, 2010 11:17 AM  Refills Requested: Medication #1:  NORVASC 10 MG  TABS 1 by mouth once daily   Dosage confirmed as above?Dosage Confirmed   Brand Name Necessary? No   Supply Requested: 1 month   Last Refilled: 01/08/2010 [facility] [address]  Next Appointment Scheduled: 11.9.11 Initial call taken by: Harold Barban,  January 29, 2010 11:17 AM  Follow-up for Phone Call        duplicate.Marland KitchenMarland KitchenRx sent 01/08/10 with a quantity of 90 and 3 refills by Dr.Lowne Follow-up by: Almeta Monas CMA Duncan Dull),  January 29, 2010 11:53 AM

## 2010-05-04 NOTE — Miscellaneous (Signed)
Summary: [facility]  [facility]   Imported By: Lanelle Bal 12/22/2009 11:19:39  _____________________________________________________________________  External Attachment:    Type:   Image     Comment:   External Document  Appended Document: [facility]    Clinical Lists Changes  Observations: Added new observation of FLU VAX: Fluvax MCR (12/11/2009 22:47)       Immunization History:  Influenza Immunization History:    Influenza:  Fluvax MCR (12/11/2009)

## 2010-05-04 NOTE — Assessment & Plan Note (Signed)
Summary: 2ND HEP B INJ, BP CHECK, 272/4  hep, lipid//[facility]   Vital Signs:  Patient profile:   77 year old female Height:      68 inches Weight:      175.4 pounds BMI:     26.77 Temp:     97.3 degrees F oral BP sitting:   116 / 72  (left arm) Cuff size:   regular  Vitals Entered By: Almeta Monas CMA Duncan Dull) (January 08, 2010 10:46 AM) CC: needs hep vaccine and meds refilled   History of Present Illness:  Hypertension follow-up      This is a 77 year old woman who presents for Hypertension follow-up.  The patient denies lightheadedness, urinary frequency, headaches, edema, impotence, rash, and fatigue.  The patient denies the following associated symptoms: chest pain, chest pressure, exercise intolerance, dyspnea, palpitations, syncope, leg edema, and pedal edema.  Compliance with medications (by patient report) has been near 100%.  The patient reports that dietary compliance has been excellent.  The patient reports no exercise.  Adjunctive measures currently used by the patient include salt restriction.    Hyperlipidemia follow-up      The patient also presents for Hyperlipidemia follow-up.  The patient denies muscle aches, GI upset, abdominal pain, flushing, itching, constipation, diarrhea, and fatigue.  The patient denies the following symptoms: chest pain/pressure, exercise intolerance, dypsnea, palpitations, syncope, and pedal edema.  Compliance with medications (by patient report) has been near 100%.  Dietary compliance has been excellent.  The patient reports no exercise.  Adjunctive measures currently used by the patient include ASA.    Pt c/o occassionally stiffness in legs but it doesn't last long and is not interfering with her day.    Current Medications (verified): 1)  Norvasc 10 Mg  Tabs (Amlodipine Besylate) .Marland Kitchen.. 1 By Mouth Once Daily 2)  Vit-E .Marland Kitchen.. 1 By Mouth Once Daily 3)  Vit-C .Marland Kitchen.. 1 By Mouth Once Daily 4)  Calcium .Marland Kitchen.. 1 By Mouth Once Daily 5)  Vit-D-3 .Marland Kitchen.. 1 By Mouth  Once Daily 6)  Fish Oil .... 2 By Mouth Two Times A Day 7)  Folic Acid .Marland Kitchen.. 1 By Mouth One Time A Day 8)  Co-Q10 .Marland Kitchen.. 1 By Mouth Once Daily 9)  Vit-A .Marland Kitchen.. 1 By Mouth Once Daily 10)  Bayer Low Strength 81 Mg Tbec (Aspirin) .... One Tablet By Mouth Every Day 11)  Vitamin B-12 1000 Mcg Tabs (Cyanocobalamin) .Marland Kitchen.. 1 Once Daily 12)  Cvs Vitamin B-1 100 Mg Tabs (Thiamine Hcl) .... One Tablet By Mouth Once Daily 13)  Ursodiol 500 Mg Tabs (Ursodiol) .... Take 1 Tab Two Times A Day 14)  Fish Oil 1000 Mg Caps (Omega-3 Fatty Acids) .... 2 By Mouth Two Times A Day 15)  Zocor 20 Mg Tabs (Simvastatin) .Marland Kitchen.. 1 By Mouth At Bedtime 16)  Fenofibrate 160 Mg Tabs (Fenofibrate) .... Take 1 By Mouth Once Daily  Allergies (verified): No Known Drug Allergies  Past History:  Past Medical History: Last updated: Dec 14, 2007 Hypertension Hyperlipidemia Abd. aortic aneurysm Ulcer with bleed - 2000  Past Surgical History: Last updated: 12/14/07 Cataract Extraction OU L breast biopsy-benign 1994  Family History: Last updated: Dec 14, 2007 Father: DECEASED Mother: DECEASED Siblings: 1 BRO-LIVING, 1 BRO-DECEASED, 1 SIS-LIVING, 1 SIS-DECEASED No FH of Colon Cancer:  Social History: Last updated: 11/19/2008 Married-- widow with children Current Smoke 8 cigs per day started 1960 Alcohol use-no Drug use-no Regular exercise-yes retired paralegal  Risk Factors: Caffeine Use: 5+ (06/13/2007) Exercise: no (11/11/2008)  Risk Factors: Smoking Status: current (06/13/2007) Packs/Day: 8 CIG QD  (02/21/2007) Passive Smoke Exposure: no (06/13/2007)  Physical Exam  General:  Well-developed,well-nourished,in no acute distress; alert,appropriate and cooperative throughout examination Neck:  No deformities, masses, or tenderness noted. Lungs:  Normal respiratory effort, chest expands symmetrically. Lungs are clear to auscultation, no crackles or wheezes. Heart:  normal rate and no murmur.   Extremities:  No  clubbing, cyanosis, edema, or deformity noted with normal full range of motion of all joints.   Psych:  Cognition and judgment appear intact. Alert and cooperative with normal attention span and concentration. No apparent delusions, illusions, hallucinations   Impression & Recommendations:  Problem # 1:  HYPERLIPIDEMIA (ICD-272.4)  Her updated medication list for this problem includes:    Zocor 20 Mg Tabs (Simvastatin) .Marland Kitchen... 1 by mouth at bedtime    Fenofibrate 160 Mg Tabs (Fenofibrate) .Marland Kitchen... Take 1 by mouth once daily  Orders: Venipuncture (04540) T- * Misc. Laboratory test [phone]) Prescription Created Electronically [phone])  Labs Reviewed: SGOT: 28 (06/12/2009)   SGPT: 26 (06/12/2009)   HDL:39.10 (06/12/2009), 29.50 (11/11/2008)  LDL:63 (06/12/2009), DEL (95/62/1308)  Chol:130 (06/12/2009), 192 (11/11/2008)  Trig:140.0 (06/12/2009), 211.0 (11/11/2008)  Problem # 2:  HYPERTENSION (ICD-401.9)  Her updated medication list for this problem includes:    Norvasc 10 Mg Tabs (Amlodipine besylate) .Marland Kitchen... 1 by mouth once daily  Orders: Venipuncture (65784) T- * Misc. Laboratory test [phone]) Prescription Created Electronically [phone])  BP today: 116/72 Prior BP: 128/82 (06/26/2009)  Labs Reviewed: K+: 4.5 (06/26/2009) Creat: : 0.9 (06/26/2009)   Chol: 130 (06/12/2009)   HDL: 39.10 (06/12/2009)   LDL: 63 (06/12/2009)   TG: 140.0 (06/12/2009)  Problem # 3:  BILIARY CIRRHOSIS, PRIMARY (ICD-571.6)  per [facility]  Orders: Prescription Created Electronically [phone])  Complete Medication List: 1)  Norvasc 10 Mg Tabs (Amlodipine besylate) .Marland Kitchen.. 1 by mouth once daily 2)  Vit-e  .Marland KitchenMarland Kitchen. 1 by mouth once daily 3)  Vit-c  .Marland Kitchen.. 1 by mouth once daily 4)  Calcium  .Marland KitchenMarland Kitchen. 1 by mouth once daily 5)  Vit-d-3  .Marland Kitchen.. 1 by mouth once daily 6)  Fish Oil  .... 2 by mouth two times a day 7)  Folic Acid  .Marland Kitchen.. 1 by mouth one time a day 8)  Co-q10  .Marland KitchenMarland Kitchen. 1 by mouth once daily 9)  Vit-a  .Marland Kitchen.. 1 by mouth once  daily 10)  Bayer Low Strength 81 Mg Tbec (Aspirin) .... One tablet by mouth every day 11)  Vitamin B-12 1000 Mcg Tabs (Cyanocobalamin) .Marland Kitchen.. 1 once daily 12)  Cvs Vitamin B-1 100 Mg Tabs (Thiamine hcl) .... One tablet by mouth once daily 13)  Ursodiol 500 Mg Tabs (Ursodiol) .... Take 1 tab two times a day 14)  Fish Oil 1000 Mg Caps (Omega-3 fatty acids) .... 2 by mouth two times a day 15)  Zocor 20 Mg Tabs (Simvastatin) .Marland Kitchen.. 1 by mouth at bedtime 16)  Fenofibrate 160 Mg Tabs (Fenofibrate) .... Take 1 by mouth once daily 17)  Zostavax 10272 Unt/0.76ml Solr (Zoster vaccine live) .Marland Kitchen.. 1 ml im x 1  Other Orders: Hepatitis B Vaccine >70yrs (53664) Admin 1st Vaccine (40347)  Patient Instructions: 1)  Please schedule a follow-up appointment in 6 months .  2)  Please schedule a follow-up appointment in 2 weeks. --to review labs Prescriptions: ZOSTAVAX [id number] UNT/0.65ML SOLR (ZOSTER VACCINE LIVE) 1 ml IM x 1  #1 x 0   Entered and Authorized by:   Loreen Freud DO  Signed by:   Loreen Freud DO on 01/08/2010   Method used:   Print then Give to Patient   RxID:   [id number] FENOFIBRATE 160 MG TABS (FENOFIBRATE) take 1 by mouth once daily  #90 x 3   Entered and Authorized by:   Loreen Freud DO   Signed by:   Loreen Freud DO on 01/08/2010   Method used:   Electronically to        [facility] [address] (retail)       [phone]       [place], [place]  [address]       Ph: [phone]       Fax: [phone]   RxID:   [id number] ZOCOR 20 MG TABS (SIMVASTATIN) 1 by mouth at bedtime  #90 x 3   Entered and Authorized by:   Loreen Freud DO   Signed by:   Loreen Freud DO on 01/08/2010   Method used:   Electronically to        [facility] [address] (retail)       [phone]       [place], [place]  [address]       Ph: [phone]       Fax: [phone]   RxID:   [id number] NORVASC 10 MG  TABS (AMLODIPINE  BESYLATE) 1 by mouth once daily  #90 x 3   Entered and Authorized by:   Loreen Freud DO   Signed by:   Loreen Freud DO on 01/08/2010   Method used:   Electronically to        [facility] [address] (retail)       [phone]       [place], [place]  [address]       Ph: [phone]       Fax: [phone]   RxID:   [id number]    Orders Added: 1)  Hepatitis B Vaccine >73yrs [90746] 2)  Admin 1st Vaccine [90471] 3)  Venipuncture [93818] 4)  T- * Misc. Laboratory test [99999] 5)  Est. Patient Level III [99213] 6)  Prescription Created Electronically [phone]    Immunizations Administered:  Hepatitis B Vaccine # 2:    Vaccine Type: HepB Adult    Site: left deltoid    Mfr: Merck    Dose: 0.1 ml    Route: IM    Given by: Almeta Monas CMA (AAMA)    Exp. Date: 03/18/2012    Lot #: [id number]    VIS given: 10/19/05 version given January 08, 2010.    Immunizations Administered:  Hepatitis B Vaccine # 2:    Vaccine Type: HepB Adult    Site: left deltoid    Mfr: Merck    Dose: 0.1 ml    Route: IM    Given by: Almeta Monas CMA (AAMA)    Exp. Date: 03/18/2012    Lot #: [id number]    VIS given: 10/19/05 version given January 08, 2010.

## 2010-05-04 NOTE — Assessment & Plan Note (Signed)
Summary: severe nose bleeds/kdc   Vital Signs:  Patient profile:   77 year old female Weight:      190 pounds Pulse rate:   93 / minute Pulse rhythm:   regular BP sitting:   128 / 82  (left arm) Cuff size:   regular  Vitals Entered By: Army Fossa CMA (June 26, 2009 10:48 AM) CC: Pt here for severe nose bleeds since last saturday every day. The longest lasted 50 mins. She has been blowing clots she says.    History of Present Illness: Pt here with daughter c/o frequently nose bleeds.  they started about 1 week ago.  Pt has humidifier in house.  No HA.  Sometimes she gets 2 a day.   Pt has been feeling very tired since they started as well.   Current Medications (verified): 1)  Norvasc 10 Mg  Tabs (Amlodipine Besylate) .Marland Kitchen.. 1 By Mouth Once Daily 2)  Vit-E .Marland Kitchen.. 1 By Mouth Once Daily 3)  Vit-C .Marland Kitchen.. 1 By Mouth Once Daily 4)  Calcium .Marland Kitchen.. 1 By Mouth Once Daily 5)  Vit-D-3 .Marland Kitchen.. 1 By Mouth Once Daily 6)  Fish Oil .... 2 By Mouth Two Times A Day 7)  Folic Acid .Marland Kitchen.. 1 By Mouth One Time A Day 8)  Co-Q10 .Marland Kitchen.. 1 By Mouth Once Daily 9)  Vit-A .Marland Kitchen.. 1 By Mouth Once Daily 10)  Bayer Low Strength 81 Mg Tbec (Aspirin) .... One Tablet By Mouth Every Day 11)  Vitamin B-12 1000 Mcg Tabs (Cyanocobalamin) .Marland Kitchen.. 1 Once Daily 12)  Cvs Vitamin B-1 100 Mg Tabs (Thiamine Hcl) .... One Tablet By Mouth Once Daily 13)  Ursodiol 500 Mg Tabs (Ursodiol) .... Take 1 Tab Two Times A Day 14)  Fish Oil 1000 Mg Caps (Omega-3 Fatty Acids) .... 2 By Mouth Two Times A Day 15)  Zocor 20 Mg Tabs (Simvastatin) .Marland Kitchen.. 1 By Mouth At Bedtime 16)  Fenofibrate 160 Mg Tabs (Fenofibrate) .... Take 1 By Mouth Once Daily  Allergies (verified): No Known Drug Allergies  Past History:  Past medical, surgical, family and social histories (including risk factors) reviewed for relevance to current acute and chronic problems.  Past Medical History: Reviewed history from 12/05/2007 and no changes  required. Hypertension Hyperlipidemia Abd. aortic aneurysm Ulcer with bleed - 2000  Past Surgical History: Reviewed history from 12/05/2007 and no changes required. Cataract Extraction OU L breast biopsy-benign 1994  Family History: Reviewed history from 12/05/2007 and no changes required. Father: DECEASED Mother: DECEASED Siblings: 1 BRO-LIVING, 1 BRO-DECEASED, 1 SIS-LIVING, 1 SIS-DECEASED No FH of Colon Cancer:  Social History: Reviewed history from 11/19/2008 and no changes required. Married-- widow with children Current Smoke 8 cigs per day started 1960 Alcohol use-no Drug use-no Regular exercise-yes retired IT consultant  Review of Systems      See HPI  Physical Exam  General:  Well-developed,well-nourished,in no acute distress; alert,appropriate and cooperative throughout examination Ears:  External ear exam shows no significant lesions or deformities.  Otoscopic examination reveals clear canals, tympanic membranes are intact bilaterally without bulging, retraction, inflammation or discharge. Hearing is grossly normal bilaterally. Nose:  + dry blood L nostril  R normal Mouth:  Oral mucosa and oropharynx without lesions or exudates.  Teeth in good repair. Neck:  No deformities, masses, or tenderness noted. Lungs:  Normal respiratory effort, chest expands symmetrically. Lungs are clear to auscultation, no crackles or wheezes. Heart:  normal rate.   Cervical Nodes:  No lymphadenopathy noted Psych:  Cognition and judgment  appear intact. Alert and cooperative with normal attention span and concentration. No apparent delusions, illusions, hallucinations   Impression & Recommendations:  Problem # 1:  EPISTAXIS (ICD-784.7) pt can use afrin if it occurs again con't with pinching nose and ice if she can not stop bleeding call office or go to ER ENT referral made Orders: Venipuncture (08657) TLB-B12 + Folate Pnl (84696_29528-U13/KGM) TLB-BMP (Basic Metabolic Panel-BMET)  (80048-METABOL) TLB-CBC Platelet - w/Differential (85025-CBCD) TLB-TSH (Thyroid Stimulating Hormone) (01027-OZD) ENT Referral (ENT)  Problem # 2:  FATIGUE (ICD-780.79)  Orders: Venipuncture (66440) TLB-B12 + Folate Pnl ([id number]-O75/IEP) TLB-BMP (Basic Metabolic Panel-BMET) (80048-METABOL) TLB-CBC Platelet - w/Differential (85025-CBCD) TLB-TSH (Thyroid Stimulating Hormone) (84443-TSH)  Complete Medication List: 1)  Norvasc 10 Mg Tabs (Amlodipine besylate) .Marland Kitchen.. 1 by mouth once daily 2)  Vit-e  .Marland KitchenMarland Kitchen. 1 by mouth once daily 3)  Vit-c  .Marland Kitchen.. 1 by mouth once daily 4)  Calcium  .Marland KitchenMarland Kitchen. 1 by mouth once daily 5)  Vit-d-3  .Marland Kitchen.. 1 by mouth once daily 6)  Fish Oil  .... 2 by mouth two times a day 7)  Folic Acid  .Marland Kitchen.. 1 by mouth one time a day 8)  Co-q10  .Marland KitchenMarland Kitchen. 1 by mouth once daily 9)  Vit-a  .Marland Kitchen.. 1 by mouth once daily 10)  Bayer Low Strength 81 Mg Tbec (Aspirin) .... One tablet by mouth every day 11)  Vitamin B-12 1000 Mcg Tabs (Cyanocobalamin) .Marland Kitchen.. 1 once daily 12)  Cvs Vitamin B-1 100 Mg Tabs (Thiamine hcl) .... One tablet by mouth once daily 13)  Ursodiol 500 Mg Tabs (Ursodiol) .... Take 1 tab two times a day 14)  Fish Oil 1000 Mg Caps (Omega-3 fatty acids) .... 2 by mouth two times a day 15)  Zocor 20 Mg Tabs (Simvastatin) .Marland Kitchen.. 1 by mouth at bedtime 16)  Fenofibrate 160 Mg Tabs (Fenofibrate) .... Take 1 by mouth once daily Prescriptions: FENOFIBRATE 160 MG TABS (FENOFIBRATE) take 1 by mouth once daily  #30 x 5   Entered and Authorized by:   Loreen Freud DO   Signed by:   Loreen Freud DO on 06/26/2009   Method used:   Electronically to        [facility] [address] (retail)       [phone]       [place], [place]  [address]       Ph: [phone]       Fax: [phone]   RxID:   [id number] ZOCOR 20 MG TABS (SIMVASTATIN) 1 by mouth at bedtime  #30 Each x 5   Entered and Authorized by:   Loreen Freud DO   Signed by:   Loreen Freud DO on  06/26/2009   Method used:   Electronically to        [facility] [address] (retail)       [phone]       [place], [place]  [address]       Ph: [phone]       Fax: [phone]   RxID:   [id number] NORVASC 10 MG  TABS (AMLODIPINE BESYLATE) 1 by mouth once daily  #30 x 5   Entered and Authorized by:   Loreen Freud DO   Signed by:   Loreen Freud DO on 06/26/2009   Method used:   Electronically to        [facility] [address] (retail)  [address]       [place], [place]  [address]       Ph: [phone]       Fax: [phone]   RxID:   [id number]

## 2010-05-06 NOTE — Assessment & Plan Note (Signed)
Summary: F3M/DM   Referring Provider:  Loreen Freud Primary Provider:  Janit Bern  CC:  dizziness.  History of Present Illness: Deanna [name] is seen today post AAA repair.  Preop echo normal with mild arotic root dilatation.  Post op course unremarkable.  BP borderline on Norvasc  Will monitor at home.  Does not want to be on Zetia.  Causes significant dizzyness.  Reviewed lipomed profile and Zetia started to decrease small particle absorption. Will hold it and if dizzyness resolves just continue fenobibrate and statin.  No SSCP, dyspnea or palpitaiotns.  Has stopped smoking since AAA repair  Enjoys going to [place] to gamble  Current Problems (verified): 1)  Murmur  (ICD-785.2) 2)  Electrocardiogram, Abnormal  (ICD-794.31) 3)  Smoker  (ICD-305.1) 4)  Hyperlipidemia  (ICD-272.4) 5)  Hypertension  (ICD-401.9) 6)  Aneurysm, Abdominal Aortic  (ICD-441.4) 7)  Biliary Cirrhosis, Primary  (ICD-571.6) 8)  Fatigue  (ICD-780.79) 9)  Epistaxis  (ICD-784.7) 10)  Obstructive Sleep Apnea  (ICD-327.23) 11)  Leg Pain, Bilateral  (ICD-729.5) 12)  Gerd  (ICD-530.81) 13)  Nonspecific Abnormal Results Livr Function Study  (ICD-794.8) 14)  Screening Colorectal-cancer  (ICD-V76.51) 15)  Alanine Aminotransferase, Serum, Elevated  (ICD-790.4) 16)  Pneumonia  (ICD-486) 17)  Liver Function Tests, Abnormal  (ICD-794.8) 18)  Preventive Health Care  (ICD-V70.0) 19)  Abdominal Aneurysm Without Mention of Rupture  (ICD-441.4) 20)  Contusion, Face Lip  (ICD-920)  Current Medications (verified): 1)  Norvasc 10 Mg Tabs (Amlodipine Besylate) .... Will Pick Today Takes 1 Tab By Mouth Once Daily 2)  Vit-E .Marland Kitchen.. 1 By Mouth Once Daily 3)  Vit-C .Marland Kitchen.. 1 By Mouth Once Daily 4)  Calcium .Marland Kitchen.. 1 By Mouth Once Daily 5)  Vit-D-3 .Marland Kitchen.. 1 By Mouth Once Daily 6)  Fish Oil .... 2 By Mouth Two Times A Day 7)  Folic Acid .Marland Kitchen.. 1 By Mouth One Time A Day 8)  Co-Q10 .Marland Kitchen.. 1 By Mouth Once Daily 9)  Vit-A .Marland Kitchen.. 1 By Mouth Once  Daily 10)  Bayer Low Strength 81 Mg Tbec (Aspirin) .... One Tablet By Mouth Every Day 11)  Vitamin B-12 1000 Mcg Tabs (Cyanocobalamin) .Marland Kitchen.. 1 Once Daily 12)  Cvs Vitamin B-1 100 Mg Tabs (Thiamine Hcl) .... One Tablet By Mouth Once Daily 13)  Zocor 20 Mg Tabs (Simvastatin) .Marland Kitchen.. 1 By Mouth At Bedtime 14)  Fenofibrate 160 Mg Tabs (Fenofibrate) .... Take 1 By Mouth Once Daily 15)  Niacin .Marland Kitchen.. 1 Tab By Mouth Once Daily  Allergies (verified): No Known Drug Allergies  Past History:  Past Medical History: Last updated: 01/28/2010 HYPERLIPIDEMIA HYPERTENSION ANEURYSM, ABDOMINAL AORTIC BILIARY CIRRHOSIS, PRIMARY  FATIGUE EPISTAXIS  OBSTRUCTIVE SLEEP APNEA  LEG PAIN, BILATERAL  GERD NONSPECIFIC ABNORMAL RESULTS LIVR FUNCTION STUDY SCREENING COLORECTAL-CANCER  ALANINE AMINOTRANSFERASE, SERUM, ELEVATED PNEUMONIA LIVER FUNCTION TESTS, ABNORMAL  PREVENTIVE HEALTH CARE ABDOMINAL ANEURYSM WITHOUT MENTION OF RUPTURE CONTUSION, FACE LIP  Ulcer with bleed - 2000  Past Surgical History: Last updated: 04/23/2010   Aorta bi-common iliac artery bypass with a 20 x 10  Hemashield graft. Larina Earthly, MD        Cataract Extraction OU L breast biopsy-benign 1994  Family History: Last updated: 12-25-07 Father: DECEASED Mother: DECEASED Siblings: 1 BRO-LIVING, 1 BRO-DECEASED, 1 SIS-LIVING, 1 SIS-DECEASED No FH of Colon Cancer:  Social History: Last updated: 11/19/2008 Married-- widow with children Current Smoke 8 cigs per day started 1960 Alcohol use-no Drug use-no Regular exercise-yes retired IT consultant  Review of Systems  Denies fever, malais, weight loss, blurry vision, decreased visual acuity, cough, sputum, SOB, hemoptysis, pleuritic pain, palpitaitons, heartburn, abdominal pain, melena, lower extremity edema, claudication, or rash.   Vital Signs:  Patient profile:   77 year old female Height:      68 inches Weight:      180 pounds BMI:     27.47 Pulse rate:   75 /  minute Resp:     14 per minute BP sitting:   138 / 84  (left arm)  Vitals Entered By: Kem Parkinson (April 23, 2010 11:47 AM)  Physical Exam  General:  Affect appropriate Healthy:  appears stated age HEENT: normal Neck supple with no adenopathy JVP normal no bruits no thyromegaly Lungs clear with no wheezing and good diaphragmatic motion Heart:  S1/S2 no murmur,rub, gallop or click PMI normal Abdomen: benighn, BS positve, no tenderness, no AAA no bruit.  No HSM or HJR Distal pulses intact with no bruits No edema Neuro non-focal Skin warm and dry AAA scar healed well   Impression & Recommendations:  Problem # 1:  HYPERLIPIDEMIA (ICD-272.4) Hold Zetia and see if dizzyness improves The following medications were removed from the medication list:    Zetia 10 Mg Tabs (Ezetimibe) .Marland Kitchen... 1 by mouth once daily Her updated medication list for this problem includes:    Zocor 20 Mg Tabs (Simvastatin) .Marland Kitchen... 1 by mouth at bedtime    Fenofibrate 160 Mg Tabs (Fenofibrate) .Marland Kitchen... Take 1 by mouth once daily  Problem # 2:  HYPERTENSION (ICD-401.9) Home observatin May need to change to ARB and diuretic.  F.U 3 months Her updated medication list for this problem includes:    Norvasc 10 Mg Tabs (Amlodipine besylate) .Marland Kitchen... Will pick today takes 1 tab by mouth once daily    Bayer Low Strength 81 Mg Tbec (Aspirin) ..... One tablet by mouth every day  Problem # 3:  ANEURYSM, ABDOMINAL AORTIC (ICD-441.4) F/U duplex in 3 months.  No apparant complicaitons from surgery  Patient Instructions: 1)  Your physician has recommended you make the following change in your medication:STOP ZETIA  2)  Your physician wants you to follow-up in: 3 MONTHS  You will receive a reminder letter in the mail two months in advance. If you don't receive a letter, please call our office to schedule the follow-up appointment.

## 2010-05-06 NOTE — Letter (Signed)
Summary: [facility] - Office Visit  [facility] - Office Visit   Imported By: Marylou Mccoy 03/12/2010 12:46:27  _____________________________________________________________________  External Attachment:    Type:   Image     Comment:   External Document

## 2010-05-06 NOTE — Assessment & Plan Note (Signed)
Summary: med refill and talk about zetia///sph   Vital Signs:  Patient profile:   77 year old female Weight:      166.8 pounds Temp:     97.1 degrees F oral BP sitting:   112 / 72  (left arm) Cuff size:   regular  Vitals Entered By: Almeta Monas CMA Duncan Dull) (March 12, 2010 9:31 AM) CC: wants to discuss meds//review [facility]   History of Present Illness: pt hee to review labs.    Current Medications (verified): 1)  Norvasc 10 Mg Tabs (Amlodipine Besylate) .... Will Pick Today Takes 1 Tab By Mouth Once Daily 2)  Vit-E .Marland Kitchen.. 1 By Mouth Once Daily 3)  Vit-C .Marland Kitchen.. 1 By Mouth Once Daily 4)  Calcium .Marland Kitchen.. 1 By Mouth Once Daily 5)  Vit-D-3 .Marland Kitchen.. 1 By Mouth Once Daily 6)  Fish Oil .... 2 By Mouth Two Times A Day 7)  Folic Acid .Marland Kitchen.. 1 By Mouth One Time A Day 8)  Co-Q10 .Marland Kitchen.. 1 By Mouth Once Daily 9)  Vit-A .Marland Kitchen.. 1 By Mouth Once Daily 10)  Bayer Low Strength 81 Mg Tbec (Aspirin) .... One Tablet By Mouth Every Day 11)  Vitamin B-12 1000 Mcg Tabs (Cyanocobalamin) .Marland Kitchen.. 1 Once Daily 12)  Cvs Vitamin B-1 100 Mg Tabs (Thiamine Hcl) .... One Tablet By Mouth Once Daily 13)  Zocor 20 Mg Tabs (Simvastatin) .Marland Kitchen.. 1 By Mouth At Bedtime 14)  Fenofibrate 160 Mg Tabs (Fenofibrate) .... Take 1 By Mouth Once Daily  Allergies (verified): No Known Drug Allergies  Physical Exam  General:  Well-developed,well-nourished,in no acute distress; alert,appropriate and cooperative throughout examination Psych:  Cognition and judgment appear intact. Alert and cooperative with normal attention span and concentration. No apparent delusions, illusions, hallucinations   Impression & Recommendations:  Problem # 1:  HYPERLIPIDEMIA (ICD-272.4)  The following medications were removed from the medication list:    Zetia 10 Mg Tabs (Ezetimibe) .Marland Kitchen... Take 1 by mouth once daily Her updated medication list for this problem includes:    Zocor 20 Mg Tabs (Simvastatin) .Marland Kitchen... 1 by mouth at bedtime    Fenofibrate 160  Mg Tabs (Fenofibrate) .Marland Kitchen... Take 1 by mouth once daily    Zetia 10 Mg Tabs (Ezetimibe) .Marland Kitchen... 1 by mouth once daily  Labs Reviewed: SGOT: 28 (06/12/2009)   SGPT: 26 (06/12/2009)   HDL:39.10 (06/12/2009), 29.50 (11/11/2008)  LDL:63 (06/12/2009), DEL (91/47/8295)  Chol:130 (06/12/2009), 192 (11/11/2008)  Trig:140.0 (06/12/2009), 211.0 (11/11/2008)  Problem # 2:  HYPERTENSION (ICD-401.9)  Her updated medication list for this problem includes:    Norvasc 10 Mg Tabs (Amlodipine besylate) .Marland Kitchen... Will pick today takes 1 tab by mouth once daily  BP today: 112/72 Prior BP: 110/80 (01/29/2010)  Labs Reviewed: K+: 4.5 (06/26/2009) Creat: : 0.9 (06/26/2009)   Chol: 130 (06/12/2009)   HDL: 39.10 (06/12/2009)   LDL: 63 (06/12/2009)   TG: 140.0 (06/12/2009)  Complete Medication List: 1)  Norvasc 10 Mg Tabs (Amlodipine besylate) .... Will pick today takes 1 tab by mouth once daily 2)  Vit-e  .Marland KitchenMarland Kitchen. 1 by mouth once daily 3)  Vit-c  .Marland Kitchen.. 1 by mouth once daily 4)  Calcium  .Marland KitchenMarland Kitchen. 1 by mouth once daily 5)  Vit-d-3  .Marland Kitchen.. 1 by mouth once daily 6)  Fish Oil  .... 2 by mouth two times a day 7)  Folic Acid  .Marland Kitchen.. 1 by mouth one time a day 8)  Co-q10  .Marland KitchenMarland Kitchen. 1 by mouth once daily 9)  Vit-a  .Marland KitchenMarland KitchenMarland Kitchen  1 by mouth once daily 10)  Bayer Low Strength 81 Mg Tbec (Aspirin) .... One tablet by mouth every day 11)  Vitamin B-12 1000 Mcg Tabs (Cyanocobalamin) .Marland Kitchen.. 1 once daily 12)  Cvs Vitamin B-1 100 Mg Tabs (Thiamine hcl) .... One tablet by mouth once daily 13)  Zocor 20 Mg Tabs (Simvastatin) .Marland Kitchen.. 1 by mouth at bedtime 14)  Fenofibrate 160 Mg Tabs (Fenofibrate) .... Take 1 by mouth once daily 15)  Zetia 10 Mg Tabs (Ezetimibe) .Marland Kitchen.. 1 by mouth once daily  Patient Instructions: 1)  272.4  [facility]---3 months---- ov when results arrive Prescriptions: ZETIA 10 MG TABS (EZETIMIBE) 1 by mouth once daily  #30 x 5   Entered and Authorized by:   Loreen Freud DO   Signed by:   Loreen Freud DO on 03/12/2010   Method used:    Electronically to        [facility] [address] (retail)       [phone]       [place], [place]  [address]       Ph: [phone]       Fax: [phone]   RxID:   [id number]    Orders Added: 1)  Est. Patient Level III [27253]

## 2010-06-09 ENCOUNTER — Ambulatory Visit: Payer: Self-pay | Admitting: Family Medicine

## 2010-06-10 ENCOUNTER — Ambulatory Visit: Payer: Self-pay | Admitting: Family Medicine

## 2010-06-15 ENCOUNTER — Ambulatory Visit (INDEPENDENT_AMBULATORY_CARE_PROVIDER_SITE_OTHER): Payer: Medicare Other | Admitting: Family Medicine

## 2010-06-15 ENCOUNTER — Encounter: Payer: Self-pay | Admitting: Family Medicine

## 2010-06-15 ENCOUNTER — Other Ambulatory Visit: Payer: Self-pay | Admitting: Family Medicine

## 2010-06-15 DIAGNOSIS — I10 Essential (primary) hypertension: Secondary | ICD-10-CM

## 2010-06-15 DIAGNOSIS — R7309 Other abnormal glucose: Secondary | ICD-10-CM

## 2010-06-15 DIAGNOSIS — E785 Hyperlipidemia, unspecified: Secondary | ICD-10-CM

## 2010-06-15 DIAGNOSIS — N390 Urinary tract infection, site not specified: Secondary | ICD-10-CM

## 2010-06-15 LAB — CONVERTED CEMR LAB
Blood in Urine, dipstick: NEGATIVE
Nitrite: POSITIVE
Specific Gravity, Urine: 1.015

## 2010-06-15 LAB — BASIC METABOLIC PANEL
BUN: 21 mg/dL (ref 6–23)
CO2: 28 mEq/L (ref 19–32)
CO2: 29 mEq/L (ref 19–32)
Calcium: 8.7 mg/dL (ref 8.4–10.5)
Chloride: 104 mEq/L (ref 96–112)
Creatinine, Ser: 1.1 mg/dL (ref 0.4–1.2)
Creatinine, Ser: 0.79 mg/dL (ref 0.4–1.2)
GFR calc Af Amer: >60 mL/min (ref >60)
Glucose, Bld: 87 mg/dL (ref 70–99)
Glucose, Bld: 135 mg/dL — ABNORMAL HIGH (ref 70–99)
Potassium: 4.6 mEq/L (ref 3.5–5.1)

## 2010-06-15 LAB — CBC
HCT: 35.9 % — ABNORMAL LOW (ref 36.0–46.0)
MCH: 28 pg (ref 26.0–34.0)
MCHC: 32.6 g/dL (ref 30.0–36.0)
MCV: 85.9 fL (ref 78.0–100.0)
Platelets: 144 10*3/uL — ABNORMAL LOW (ref 150–400)
RBC: 4.15 MIL/uL (ref 3.87–5.11)
RDW: 14.6 % (ref 11.5–15.5)
RDW: 14.8 % (ref 11.5–15.5)
WBC: 9.2 10*3/uL (ref 4.0–10.5)

## 2010-06-15 LAB — COMPREHENSIVE METABOLIC PANEL
ALT: 16 U/L (ref 0–35)
Albumin: 2.4 g/dL — ABNORMAL LOW (ref 3.5–5.2)
Alkaline Phosphatase: 107 U/L (ref 39–117)
Chloride: 104 mEq/L (ref 96–112)
Glucose, Bld: 143 mg/dL — ABNORMAL HIGH (ref 70–99)
Potassium: 4.5 mEq/L (ref 3.5–5.1)
Sodium: 133 mEq/L — ABNORMAL LOW (ref 135–145)
Total Protein: 5.5 g/dL — ABNORMAL LOW (ref 6.0–8.3)

## 2010-06-15 LAB — CBC WITH DIFFERENTIAL/PLATELET
Basophils Absolute: 0 10*3/uL (ref 0.0–0.1)
Eosinophils Absolute: 0 10*3/uL (ref 0.0–0.7)
Eosinophils Relative: 0.1 % (ref 0.0–5.0)
HCT: 40.1 % (ref 36.0–46.0)
Lymphs Abs: 2.4 10*3/uL (ref 0.7–4.0)
MCHC: 33.7 g/dL (ref 30.0–36.0)
MCV: 85.6 fl (ref 78.0–100.0)
Monocytes Absolute: 0.5 10*3/uL (ref 0.1–1.0)
Platelets: 272 10*3/uL (ref 150.0–400.0)
RDW: 15.9 % — ABNORMAL HIGH (ref 11.5–14.6)

## 2010-06-15 LAB — HEPATIC FUNCTION PANEL
ALT: 13 U/L (ref 0–35)
Total Bilirubin: 0.5 mg/dL (ref 0.3–1.2)

## 2010-06-15 LAB — HEMOGLOBIN A1C: Hgb A1c MFr Bld: 5.3 % (ref 4.6–6.5)

## 2010-06-16 LAB — PROTIME-INR
INR: 0.98 (ref 0.00–1.49)
INR: 1.34 (ref 0.00–1.49)
Prothrombin Time: 13.2 seconds (ref 11.6–15.2)

## 2010-06-16 LAB — COMPREHENSIVE METABOLIC PANEL
ALT: 27 U/L (ref 0–35)
BUN: 13 mg/dL (ref 6–23)
Calcium: 9.3 mg/dL (ref 8.4–10.5)
Glucose, Bld: 108 mg/dL — ABNORMAL HIGH (ref 70–99)
Sodium: 135 mEq/L (ref 135–145)
Total Protein: 7.6 g/dL (ref 6.0–8.3)

## 2010-06-16 LAB — TYPE AND SCREEN
ABO/RH(D): B POS
Donor AG Type: NEGATIVE
Donor AG Type: NEGATIVE
Unit division: 0

## 2010-06-16 LAB — BLOOD GAS, ARTERIAL
Drawn by: 206361
FIO2: 0.21 %
Patient temperature: 98.6
TCO2: 22 mmol/L (ref 0–100)
pH, Arterial: 7.431 — ABNORMAL HIGH (ref 7.350–7.400)

## 2010-06-16 LAB — BASIC METABOLIC PANEL
BUN: 9 mg/dL (ref 6–23)
Creatinine, Ser: 0.75 mg/dL (ref 0.4–1.2)
GFR calc non Af Amer: >60 mL/min (ref >60)

## 2010-06-16 LAB — APTT: aPTT: 30 seconds (ref 24–37)

## 2010-06-16 LAB — CBC
HCT: 42.7 % (ref 36.0–46.0)
MCHC: 34 g/dL (ref 30.0–36.0)
Platelets: 151 10*3/uL (ref 150–400)
Platelets: 215 10*3/uL (ref 150–400)
RDW: 14.5 % (ref 11.5–15.5)
RDW: 14.7 % (ref 11.5–15.5)
WBC: 11.4 10*3/uL — ABNORMAL HIGH (ref 4.0–10.5)

## 2010-06-16 LAB — SURGICAL PCR SCREEN
MRSA, PCR: NEGATIVE
Staphylococcus aureus: NEGATIVE

## 2010-06-22 NOTE — Assessment & Plan Note (Signed)
Summary: RTO 3 MONTHS////CBS   Vital Signs:  Patient profile:   77 year old female Weight:      164.0 pounds Pulse rate:   76 / minute Pulse rhythm:   regular BP sitting:   128 / 76  (left arm) Cuff size:   regular  Vitals Entered By: Almeta Monas CMA Duncan Dull) (June 15, 2010 10:12 AM) CC: 3 mo f/u- fasting also needs 272.4  [facility]---3 months (stopped Zetia due dizziness)   History of Present Illness:  Hyperlipidemia follow-up      This is a 77 year old woman who presents for Hyperlipidemia follow-up.  The patient denies muscle aches, GI upset, abdominal pain, flushing, itching, constipation, diarrhea, and fatigue.  The patient denies the following symptoms: chest pain/pressure, exercise intolerance, dypsnea, palpitations, syncope, and pedal edema.  Compliance with medications (by patient report) has been near 100%.  Dietary compliance has been good.  The patient reports exercising 3-4X per week.  Adjunctive measures currently used by the patient include ASA, niacin, fish oil supplements, and weight reduction.  Pt stopped Zetia secondary to dizziness and dizziness resolved.   Hypertension follow-up      The patient also presents for Hypertension follow-up.  The patient denies lightheadedness, urinary frequency, headaches, edema, impotence, rash, and fatigue.  The patient denies the following associated symptoms: chest pain, chest pressure, exercise intolerance, dyspnea, palpitations, syncope, leg edema, and pedal edema.  Compliance with medications (by patient report) has been near 100%.  The patient reports that dietary compliance has been good.  The patient reports exercising 3-4X per week.  Adjunctive measures currently used by the patient include salt restriction.    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) 9)  Fatigue  (ICD-780.79) 10)  Epistaxis  (ICD-784.7) 11)  Obstructive Sleep Apnea  (ICD-327.23) 12)  Leg Pain, Bilateral  (ICD-729.5) 13)  Gerd  (ICD-530.81) 14)  Nonspecific Abnormal Results Livr Function Study  (ICD-794.8) 15)  Screening Colorectal-cancer  (ICD-V76.51) 16)  Alanine Aminotransferase, Serum, Elevated  (ICD-790.4) 17)  Pneumonia  (ICD-486) 18)  Liver Function Tests, Abnormal  (ICD-794.8) 19)  Preventive Health Care  (ICD-V70.0) 20)  Abdominal Aneurysm Without Mention of Rupture  (ICD-441.4) 21)  Contusion, Face Lip  (ICD-920)  Medications Prior to Update: 1)  Norvasc 10 Mg Tabs (Amlodipine Besylate) .... Will Pick Today Takes 1 Tab By Mouth Once Daily 2)  Vit-E .Marland Kitchen.. 1 By Mouth Once Daily 3)  Vit-C .Marland Kitchen.. 1 By Mouth Once Daily 4)  Calcium .Marland Kitchen.. 1 By Mouth Once Daily 5)  Vit-D-3 .Marland Kitchen.. 1 By Mouth Once Daily 6)  Fish Oil .... 2 By Mouth Two Times A Day 7)  Folic Acid .Marland Kitchen.. 1 By Mouth One Time A Day 8)  Co-Q10 .Marland Kitchen.. 1 By Mouth Once Daily 9)  Vit-A .Marland Kitchen.. 1 By Mouth Once Daily 10)  Bayer Low Strength 81 Mg Tbec (Aspirin) .... One Tablet By Mouth Every Day 11)  Vitamin B-12 1000 Mcg Tabs (Cyanocobalamin) .Marland Kitchen.. 1 Once Daily 12)  Cvs Vitamin B-1 100 Mg Tabs (Thiamine Hcl) .... One Tablet By Mouth Once Daily 13)  Zocor 20 Mg Tabs (Simvastatin) .Marland Kitchen.. 1 By Mouth At Bedtime 14)  Fenofibrate 160 Mg Tabs (Fenofibrate) .... Take 1 By Mouth Once  Daily 15)  Niacin .Marland Kitchen.. 1 Tab By Mouth Once Daily  Current Medications (verified): 1)  Norvasc 10 Mg Tabs (Amlodipine Besylate) .... Will Pick Today Takes 1 Tab By Mouth Once Daily 2)  Vit-E .Marland Kitchen.. 1 By Mouth Once Daily 3)  Vit-C .Marland Kitchen.. 1 By Mouth Once Daily 4)  Calcium .Marland Kitchen.. 1 By Mouth Once Daily 5)  Vit-D-3 .Marland Kitchen.. 1 By Mouth Once Daily 6)  Fish Oil .... 2 By Mouth Two Times A Day 7)  Folic Acid .Marland Kitchen.. 1 By Mouth One Time A Day 8)  Co-Q10 .Marland Kitchen.. 1 By Mouth  Once Daily 9)  Vit-A .Marland Kitchen.. 1 By Mouth Once Daily 10)  Bayer Low Strength 81 Mg Tbec (Aspirin) .... One Tablet By Mouth Every Day 11)  Vitamin B-12 1000 Mcg Tabs (Cyanocobalamin) .Marland Kitchen.. 1 Once Daily 12)  Cvs Vitamin B-1 100 Mg Tabs (Thiamine Hcl) .... One Tablet By Mouth Once Daily 13)  Zocor 20 Mg Tabs (Simvastatin) .Marland Kitchen.. 1 By Mouth At Bedtime 14)  Fenofibrate 160 Mg Tabs (Fenofibrate) .... Take 1 By Mouth Once Daily 15)  Niacin .Marland Kitchen.. 1 Tab By Mouth Once Daily  Allergies (verified): No Known Drug Allergies  Past History:  Past medical, surgical, family and social histories (including risk factors) reviewed for relevance to current acute and chronic problems.  Past Medical History: Reviewed history from 01/28/2010 and no changes required. HYPERLIPIDEMIA HYPERTENSION ANEURYSM, ABDOMINAL AORTIC BILIARY CIRRHOSIS, PRIMARY  FATIGUE EPISTAXIS  OBSTRUCTIVE SLEEP APNEA  LEG PAIN, BILATERAL  GERD NONSPECIFIC ABNORMAL RESULTS LIVR FUNCTION STUDY SCREENING COLORECTAL-CANCER  ALANINE AMINOTRANSFERASE, SERUM, ELEVATED PNEUMONIA LIVER FUNCTION TESTS, ABNORMAL  PREVENTIVE HEALTH CARE ABDOMINAL ANEURYSM WITHOUT MENTION OF RUPTURE CONTUSION, FACE LIP  Ulcer with bleed - 2000  Past Surgical History: Reviewed history from 04/23/2010 and no changes required.   Aorta bi-common iliac artery bypass with a 20 x 10  Hemashield graft. Larina Earthly, MD        Cataract Extraction OU L breast biopsy-benign 1994  Family History: Reviewed history from 12/05/2007 and no changes required. Father: DECEASED Mother: DECEASED Siblings: 1 BRO-LIVING, 1 BRO-DECEASED, 1 SIS-LIVING, 1 SIS-DECEASED No FH of Colon Cancer:  Social History: Reviewed history from 11/19/2008 and no changes required. Married-- widow with children Current Smoke 8 cigs per day started 1960 Alcohol use-no Drug use-no Regular exercise-yes retired IT consultant  Review of Systems      See HPI  Physical Exam  General:   Well-developed,well-nourished,in no acute distress; alert,appropriate and cooperative throughout examination Lungs:  Normal respiratory effort, chest expands symmetrically. Lungs are clear to auscultation, no crackles or wheezes. Heart:  normal rate and no murmur.   Extremities:  No clubbing, cyanosis, edema, or deformity noted with normal full range of motion of all joints.   Psych:  Oriented X3, normally interactive, and good eye contact.     Impression & Recommendations:  Problem # 1:  HYPERGLYCEMIA (ICD-790.29)  Orders: Venipuncture (16109) TLB-BMP (Basic Metabolic Panel-BMET) (80048-METABOL) TLB-CBC Platelet - w/Differential (85025-CBCD) TLB-Hepatic/Liver Function Pnl (80076-HEPATIC) TLB-A1C / Hgb A1C (Glycohemoglobin) (83036-A1C)  Problem # 2:  HYPERLIPIDEMIA (ICD-272.4)  Her updated medication list for this problem includes:    Zocor 20 Mg Tabs (Simvastatin) .Marland Kitchen... 1 by mouth at bedtime    Fenofibrate 160 Mg Tabs (Fenofibrate) .Marland Kitchen... Take 1 by mouth once daily  Orders: Venipuncture (60454) TLB-BMP (Basic Metabolic Panel-BMET) (80048-METABOL) TLB-CBC Platelet - w/Differential (85025-CBCD) TLB-Hepatic/Liver Function Pnl (80076-HEPATIC) TLB-A1C / Hgb A1C (Glycohemoglobin) (83036-A1C)  Problem # 3:  HYPERTENSION (ICD-401.9)  Her updated  medication list for this problem includes:    Norvasc 10 Mg Tabs (Amlodipine besylate) .Marland Kitchen... Will pick today takes 1 tab by mouth once daily  Orders: Venipuncture (16109) TLB-BMP (Basic Metabolic Panel-BMET) (80048-METABOL) TLB-CBC Platelet - w/Differential (85025-CBCD) TLB-Hepatic/Liver Function Pnl (80076-HEPATIC) TLB-A1C / Hgb A1C (Glycohemoglobin) (83036-A1C)  Problem # 4:  ANEURYSM, ABDOMINAL AORTIC (ICD-441.4)  Complete Medication List: 1)  Norvasc 10 Mg Tabs (Amlodipine besylate) .... Will pick today takes 1 tab by mouth once daily 2)  Vit-e  .Marland KitchenMarland Kitchen. 1 by mouth once daily 3)  Vit-c  .Marland Kitchen.. 1 by mouth once daily 4)  Calcium  .Marland KitchenMarland Kitchen. 1  by mouth once daily 5)  Vit-d-3  .Marland Kitchen.. 1 by mouth once daily 6)  Fish Oil  .... 2 by mouth two times a day 7)  Folic Acid  .Marland Kitchen.. 1 by mouth one time a day 8)  Co-q10  .Marland KitchenMarland Kitchen. 1 by mouth once daily 9)  Vit-a  .Marland Kitchen.. 1 by mouth once daily 10)  Bayer Low Strength 81 Mg Tbec (Aspirin) .... One tablet by mouth every day 11)  Vitamin B-12 1000 Mcg Tabs (Cyanocobalamin) .Marland Kitchen.. 1 once daily 12)  Cvs Vitamin B-1 100 Mg Tabs (Thiamine hcl) .... One tablet by mouth once daily 13)  Zocor 20 Mg Tabs (Simvastatin) .Marland Kitchen.. 1 by mouth at bedtime 14)  Fenofibrate 160 Mg Tabs (Fenofibrate) .... Take 1 by mouth once daily 15)  Niacin  .Marland Kitchen.. 1 tab by mouth once daily  Other Orders: T-Culture, Urine (60454-09811) UA Dipstick W/ Micro (manual) (91478)  Patient Instructions: 1)  Please schedule a follow-up appointment in 6 months .    Orders Added: 1)  Venipuncture [36415] 2)  TLB-BMP (Basic Metabolic Panel-BMET) [80048-METABOL] 3)  TLB-CBC Platelet - w/Differential [85025-CBCD] 4)  TLB-Hepatic/Liver Function Pnl [80076-HEPATIC] 5)  TLB-A1C / Hgb A1C (Glycohemoglobin) [83036-A1C] 6)  T-Culture, Urine [29562-13086] 7)  UA Dipstick W/ Micro (manual) [81000] 8)  Est. Patient Level III [57846]    Laboratory Results   Urine Tests   Date/Time Reported: June 15, 2010 1:04 PM   Routine Urinalysis   Color: yellow Appearance: Clear Glucose: negative   (Normal Range: Negative) Bilirubin: negative   (Normal Range: Negative) Ketone: negative   (Normal Range: Negative) Spec. Gravity: 1.015   (Normal Range: 1.003-1.035) Blood: negative   (Normal Range: Negative) pH: 7.0   (Normal Range: 5.0-8.0) Protein: negative   (Normal Range: Negative) Urobilinogen: negative   (Normal Range: 0-1) Nitrite: positive   (Normal Range: Negative) Leukocyte Esterace: large   (Normal Range: Negative)    Comments: Floydene Flock  June 15, 2010 1:04 PM      Appended Document: RTO 3 MONTHS////CBS UA done after visit---+  UTI send culture---  Pt did not c/o of any symptoms----is she having any symtptoms? If yes cipro 500 mg 1 by mouth two times a day for 5 days----- otherwise we will wait for culture to come back.  Appended Document: RTO 3 MONTHS////CBS She did not have any symptoms at her OV when I asked.

## 2010-07-20 ENCOUNTER — Encounter: Payer: Self-pay | Admitting: Cardiovascular Disease

## 2010-07-21 ENCOUNTER — Ambulatory Visit (INDEPENDENT_AMBULATORY_CARE_PROVIDER_SITE_OTHER): Payer: Medicare Other | Admitting: Cardiovascular Disease

## 2010-07-21 ENCOUNTER — Encounter: Payer: Self-pay | Admitting: Cardiovascular Disease

## 2010-07-21 DIAGNOSIS — I714 Abdominal aortic aneurysm, without rupture: Secondary | ICD-10-CM

## 2010-07-21 DIAGNOSIS — R011 Cardiac murmur, unspecified: Secondary | ICD-10-CM

## 2010-07-21 DIAGNOSIS — E785 Hyperlipidemia, unspecified: Secondary | ICD-10-CM

## 2010-07-21 NOTE — Patient Instructions (Signed)
Your physician recommends that you schedule a follow-up appointment in: 1year with Dr. Nishan  

## 2010-07-21 NOTE — Assessment & Plan Note (Signed)
Benign SEM consider F/U echo in 2 years

## 2010-07-21 NOTE — Assessment & Plan Note (Signed)
Continue current meds  Labs per primary Limited Rx based on side effects

## 2010-07-21 NOTE — Progress Notes (Signed)
Deanna [name] is seen today post AAA repair.  Preop echo normal with mild arotic root dilatation.  Post op course unremarkable.  BP borderline on Norvasc  Will monitor at home.  Does not want to be on Zetia.  Causes significant dizzyness.  Reviewed lipomed profile and Zetia started to decrease small particle absorption. Dizzyness gone since zetia stopped.  Will continue  fenobibrate and statin.  No SSCP, dyspnea or palpitaiotns.  Has stopped smoking since AAA repair  Enjoys going to [place] to gamble  ROS: Denies fever, malais, weight loss, blurry vision, decreased visual acuity, cough, sputum, SOB, hemoptysis, pleuritic pain, palpitaitons, heartburn, abdominal pain, melena, lower extremity edema, claudication, or rash.   General: Affect appropriate Healthy:  appears stated age HEENT: normal Neck supple with no adenopathy JVP normal no bruits no thyromegaly Lungs clear with no wheezing and good diaphragmatic motion Heart:  S1/S2 soft SEM  No ,rub, gallop or click PMI normal Abdomen: benighn, BS positve, no tenderness, S/P AAA repair no bruit.  No HSM or HJR Distal pulses intact with no bruits No edema Neuro non-focal Skin warm and dry No muscular weakness   Current Outpatient Prescriptions  Medication Sig Dispense Refill  . amLODipine (NORVASC) 10 MG tablet Take 10 mg by mouth daily.        . Ascorbic Acid (VITAMIN C) 500 MG tablet Take 500 mg by mouth daily.        Marland Kitchen aspirin 81 MG tablet Take 81 mg by mouth daily.        . Cholecalciferol (VITAMIN D-3 PO) Take 1 tablet by mouth 2 (two) times daily.        . fenofibrate 160 MG tablet Take 160 mg by mouth daily.        . fish oil-omega-3 fatty acids 1000 MG capsule Take 2 g by mouth daily.        . folic acid (FOLVITE) 400 MCG tablet Take 400 mcg by mouth daily.        . niacin (NIASPAN) 500 MG CR tablet Take 500 mg by mouth at bedtime.        . simvastatin (ZOCOR) 40 MG tablet Take 40 mg by mouth at bedtime.        . Thiamine HCl  (VITAMIN B-1) 100 MG tablet Take 100 mg by mouth daily.        . vitamin A 16109 UNIT capsule Take 10,000 Units by mouth daily.        . vitamin B-12 (CYANOCOBALAMIN) 1000 MCG tablet Take 1,000 mcg by mouth daily.        . vitamin E 400 UNIT capsule Take 400 Units by mouth daily.        Marland Kitchen co-enzyme Q-10 30 MG capsule Take 30 mg by mouth 3 (three) times daily.          Allergies  Review of patient's allergies indicates no known allergies.  Electrocardiogram:  Assessment and Plan

## 2010-07-21 NOTE — Assessment & Plan Note (Signed)
S/P AAA repair with no residual palpable mass.  F/U US in 2 years

## 2010-08-17 NOTE — Procedures (Signed)
DUPLEX ULTRASOUND OF ABDOMINAL AORTA   INDICATION:  Followup from abdominal aortic aneurysm.   HISTORY:  Diabetes:  No.  Cardiac:  No.  Hypertension:  No.  Smoking:  Yes.  Family History:  No.  Previous Surgery:  No.   DUPLEX EXAM:         AP (cm)                   TRANSVERSE (cm)  Proximal             2.2 cm                    3.0 x 2.7 cm  Mid                  5.0 cm                    5.4 x 5.1 cm  Distal               3.6 cm                    4.0 x 4.0 cm  Right Iliac          1.0 cm                    2.0 x 1.8 cm  Left Iliac           1.1 cm                    1.3 x 1.2 cm   PREVIOUS:  Aorta was 4.3 x 4.2 cm   IMPRESSION:  Abdominal aortic aneurysm within the mid distal aorta  measuring 5.4 x 5.1 cm.  This is an increase in size from previous  ultrasound of 01/09/2009.  Intraluminal thrombus noted within aorta  questionable increase in size of iliacs as well with calcifications.   ___________________________________________  Larina Earthly, M.D.   OD/MEDQ  D:  01/26/2010  T:  01/26/2010  Job:  [id number]

## 2010-08-17 NOTE — Procedures (Signed)
Deanna [name], Deanna [name]               ACCOUNT NO.:  [id number]   MEDICAL RECORD NO.:  [id number]          PATIENT TYPE:  OUT   LOCATION:  SLEEP CENTER                 FACILITY[facility]  [facility]   PHYSICIAN:  Barbaraann Share, MD,FCCPDATE OF BIRTH:  01-03-32   DATE OF STUDY:  11/27/2008                            NOCTURNAL POLYSOMNOGRAM   REFERRING PHYSICIAN:   REFERRING PHYSICIAN:  Barbaraann Share, MD, FCCP   INDICATION FOR STUDY:  Hypersomnia with sleep apnea.   EPWORTH SLEEPINESS SCORE:  Three.   MEDICATIONS:   SLEEP ARCHITECTURE:  The patient had total sleep time of 341 minutes  with no slow-wave sleep and only 43 minutes of REM.  Sleep onset latency  was normal at 21 minutes, and REM onset was normal at 126 minutes.  Sleep efficiency was decreased at 76%.   RESPIRATORY DATA:  The patient was found to have 23 apneas and 13  hypopneas, giving her an apnea/hypopnea index of 6 events per hour.  Events were worse in the supine position, and there was moderate snoring  noted throughout.  The patient was also noted to have episodes of what  appeared to be changed respirations.   OXYGEN DATA:  There was O2 desaturation as low as 74% with the patient's  obstructive events.   CARDIAC DATA:  No clinically significant arrhythmias were seen.   MOVEMENT-PARASOMNIA:  The patient was found to have 56 leg jerks with 6  per hour resulting in arousal or awakening.   IMPRESSIONS-RECOMMENDATIONS:  1. Very mild obstructive sleep apnea/hypopnea syndrome with an apnea-      hypopnea index of 6 events per hour and oxygen desaturation      transiently to 74%.  There were also episodes of what appeared to      be Cheyne-Stokes      respirations during the night.  Clinical correlation is suggested.  2. Moderate numbers of leg jerks with significant sleep disruption.      Clinical correlation is suggested.      Barbaraann Share, MD,FCCP  Diplomate, American Board of Sleep  Medicine  Electronically  Signed     K[facility]/MEDQ  D:  12/14/2008 15:41:51  T:  12/15/2008 03:54:05  Job:  [id number]

## 2010-08-17 NOTE — Assessment & Plan Note (Signed)
OFFICE VISIT   Deanna [name]  DOB:  [birth year]                                       01/09/2009  [id number]#:[id number]   The patient presents today for continued followup of her asymptomatic  aneurysm.  She continues to do quite well, and there are no new medical  difficulties.  She is actually planning on going on a cruise with her  family.  She denies any cardiac difficulty.  She does have some chronic  lower back discomfort that is positional.   PHYSICAL EXAMINATION:  Blood pressure is 137/84, pulse 81, respirations  18.  Her radial, femoral and pedal pulses are 2+ bilaterally.  Abdominal  exam reveals palpable abdominal aortic aneurysm with no tenderness over  this.   She underwent repeat duplex today and this shows maximal size of 4.2,  which is up slightly from her prior ultrasound studies.  I discussed the  significance of this with the patient and recommend we continue to see  her on a yearly basis to rule out significant progression.  She  understands symptoms of leaking aneurysm and will present immediately  should this occur.   Deanna [name], M.D.  Electronically Signed   TFE/MEDQ  D:  01/09/2009  T:  01/13/2009  Job:  [id number]   cc:   Deanna Perla, DO

## 2010-08-17 NOTE — Assessment & Plan Note (Signed)
OFFICE VISIT   Deanna [name], Deanna [name]  DOB:  Nov 14, 1929                                       01/26/2010  ZOXWR#:[id number]   The patient presents today for follow-up of her known infrarenal  abdominal aortic aneurysm.  This been known for number of years and been  followed with serial ultrasound.  She presents today to our office for 1-  year follow-up with ultrasound.  This shows a significant change in the  maximal diameter going from 4.5 to 5.5 cm.  Incidentally, the patient  reports that she has had,  yesterday, a new upper abdominal pain that  goes to her back which was moderate to severe yesterday.  She did not  mention this to her family.  She lives with her daughter and remainder  of her family as well.  Her daughter is here with her today.  She has  had resolution of this discomfort.  Her past ultrasound had shown that  she did have a bilobed aneurysm with some extension up to the level of  her visceral vessels and renal artery.   PAST MEDICAL HISTORY:  Negative cardiac disease.  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   FAMILY HISTORY:  Negative for premature atherosclerotic disease.   PHYSICAL EXAMINATION:  A well-developed, well-nourished white female  appearing stated age in no acute distress.  Her carotid arteries are  without bruits bilaterally.  She has 2+ radial, 2+ femoral, 2+ dorsalis  pedis pulses.  Abdominal exam reveals easily palpable aneurysm which is  nontender.  She does have some lower abdominal tenderness.  Heart:  Regular rate and rhythm.   Due to her increased size by ultrasound and due to the discomfort she  had yesterday,  I have recommended a CT scan today which she obtained  after leaving our office.  I reviewed this CT scan and discussed it with  her daughter by telephone.  This does confirm that her aneurysm has  grown to 5.5 cm.  It does extend up to the level of renal arteries.  There is no evidence of leak.   I have recommended repair.  I explained  to the patient and her daughter that it is impossible to prove that her  discomfort from yesterday was not related to her aneurysm but typically  aneurysmal pain would persistent and would need to be treated without  resolution of pain which is in her case.  They will notify us should  immediately should she have any new abdominal or back pain.  Otherwise,  we will proceed with surgery on 10/31.  Although she does not have any  history of cardiac disease, we will obtain a noninvasive cardiac  clearance prior to her surgery and assuming this is normal, we will  proceed with surgery on 10/31 at [facility].     Larina Earthly, M.D.  Electronically Signed   TFE/MEDQ  D:  01/26/2010  T:  01/27/2010  Job:  [id number]

## 2010-08-17 NOTE — Procedures (Signed)
DUPLEX ULTRASOUND OF ABDOMINAL AORTA   INDICATION:  Followup abdominal aortic aneurysm.   HISTORY:  Diabetes:  No.  Cardiac:  No.  Hypertension:  No.  Smoking:  Yes.  Connective Tissue Disorder:  Family History:  No.  Previous Surgery:  No.   DUPLEX EXAM:         AP (cm)                   TRANSVERSE (cm)  Proximal             3.21 cm                   3.75 cm  Mid                  4.18 cm                   4.28 cm  Distal               3.65 cm                   4.08 cm  Right Iliac          1.88 cm                   1.75 cm  Left Iliac           1.04 cm                   0.80 cm   PREVIOUS:  Date:  AP:  3.75  TRANSVERSE:  3.65   IMPRESSION:  Abdominal aortic aneurysm noted with largest measurement of  4.18 x 4.28 cm slightly larger than previous study.   ___________________________________________  Larina Earthly, M.D.   CB/MEDQ  D:  01/09/2009  T:  01/09/2009  Job:  [id number]

## 2010-08-17 NOTE — Assessment & Plan Note (Signed)
OFFICE VISIT   Deanna [name], Deanna [name]  DOB:  10/10/1929                                       01/09/2008  ZOXWR#:[id number]   Patient presents today for continued followup of her known infrarenal  and suprarenal abdominal aortic aneurysm.  I had seen her on several  occasions for followup of her asymptomatic aneurysm.  She had a prior  CAT scan on 07/21/06 and had an ultrasound in our office on 03/16/07.  She was to see me again in December of this year.  She recently  underwent MRI for evaluation of elevated liver function tests, and this  showed some interval increase in size.  She is seeing me today for  further discussion.   I had reviewed both her initial CT scan and this most recent MRI from  01/07/08.  She does have a bi-lobe aneurysm with ectasia at the level of  the mesenteric vessels and also infrarenal area.  The diameter of these  is approximately 3.5 to 4 cm on both areas.  The maximal diameter by CT  scan on 07/21/06 was 3.8 cm, and on the most recent study was 3.9 cm.  She explains to me that she has been diagnosed with possible bile duct  infection and was to have a needle biopsy to confirm this.  Her history  otherwise remains stable.   She is now being treated for hypertension.  No history of cardiac  disease.   PHYSICAL EXAMINATION:  A well-developed and well-nourished white female  appearing stated age of 32.  Her blood pressure is 158/96, pulse 81,  respirations 18.  Her radial, femoral, popliteal, and posterior tibial  pulse are 2+ bilaterally with no evidence of peripheral aneurysm.  She  does have prominent aortic pulsation.   I discussed the significance of her aneurysm again with patient and her  granddaughter present.  I explained that she has shown approximately 1  mm growth, which is no significant change in the course since her prior  CT scan in April, 2008.  I will see her again in one year with  ultrasound to rule out  enlargement.  She was reassured with this  discussion and will see [place] at that point.   Larina Earthly, M.D.  Electronically Signed   TFE/MEDQ  D:  01/09/2008  T:  01/10/2008  Job:  [id number]   cc:   Lelon Perla, DO  Venita Lick. Russella Dar, MD, Clementeen Graham

## 2010-08-17 NOTE — Assessment & Plan Note (Signed)
OFFICE VISIT   Deanna [name], Deanna [name]  DOB:  [birth year]                                       03/16/2007  [id number]#:[id number]   The patient presents today for a 71-month followup of ultrasound finding  of an abdominal aortic aneurysm.  Her study at [facility] in  April 2004 revealed a 4.4 cm aneurysm.  She has remained in excellent  health since my last visit with her with no new major problems.  She  does continue to have hypertension, elevated cholesterol.  She had no  symptoms referable to her aneurysm.   PHYSICAL EXAM:  She is well-developed, well-nourished white female  appearing stated age of 19.  Blood pressure is 136/83, pulse 92,  respirations 20.  She is grossly intact neurologically.  Radial pulses  are 2+ bilaterally.  She has 2+ femoral pulses bilaterally.  Abdominal  exam reveals moderate obesity.  I do not palpate her aneurysm.  She has  no abdominal tenderness.  She underwent ultrasound of her aorta today in  our office and I have reviewed this with her.  This reveals maximal  diameter of 3.8 cm.  This is somewhat less than her prior study of 4.4  cm.  She had not been NPO and was slightly technically difficult.  I  reviewed this with the patient.  I explained that she has had no  evidence of growth and actually we are getting smaller measurements than  before.  I again reviewed symptoms of aneurysm leak with her, and she  will notify me should symptoms occur.  Otherwise, we will see her again  in 1 year with repeat  ultrasound.  She will repeat immediately to the emergency room should  she develop any symptoms referable to her aneurysm.   Larina Earthly, M.D.  Electronically Signed   TFE/MEDQ  D:  03/16/2007  T:  03/19/2007  Job:  [id number]   cc:   Lelon Perla, DO

## 2010-08-17 NOTE — Procedures (Signed)
DUPLEX ULTRASOUND OF ABDOMINAL AORTA   INDICATION:  Followup of abdominal aortic aneurysm.  The patient had  duplex of the aorta done on 07/13/2006 at [facility] which  showed an aneurysm measuring approximately 4.4 cm.   HISTORY:  Diabetes:  No.  Cardiac:  No.  Hypertension:  No.  Smoking:  No.  Connective Tissue Disorder:  Family History:  No.  Previous Surgery:   DUPLEX EXAM:         AP (cm)                   TRANSVERSE (cm)  Proximal             2.72 cm                   2.37 cm  Mid                  3.75 cm                   3.65 cm  Distal               3.53 cm                   3.53 cm  Right Iliac          1.01 cm  Left Iliac           0.95 cm   PREVIOUS:  Date: 07/13/2006  AP:  4.4 at [facility]  TRANSVERSE:   IMPRESSION:  1. Unable to detect diameters as large as on previous exam.  2. Study technically difficult since the patient was not n.p.o.   ___________________________________________  Larina Earthly, M.D.   DP/MEDQ  D:  03/16/2007  T:  03/16/2007  Job:  [id number]

## 2010-08-17 NOTE — Assessment & Plan Note (Signed)
OFFICE VISIT   Deanna [name], Deanna [name]  DOB:  [birth year]                                       02/23/2010  [id number]#:[id number]   [name] presents today for follow-up of open aneurysm repair with  aortobi-iliac bypass on February 01, 2010.  She did extremely well in the  hospital and was discharged home on postoperative day #4.  She has done  well at home as well.  She reports some insomnia.  She does have a  diminished appetite and diminished stamina that is typical but looks  quite good.  She does report some dizziness that she attributes to a  recent additional new medication.  I explained that the stamina and  diminished appetite should be expected and this is returning to normal.   PHYSICAL EXAMINATION:  Her blood pressure is 120/81, pulse 100,  respirations 18.  Abdominal exam reveals no evidence of hernia with a  well-healed incision.  She does have 2+ femoral pulses bilaterally.   She underwent noninvasive vascular laboratory studies in our office and  this reveals normal ankle arm index bilaterally at greater than 1with  normal triphasic waveforms.  I am quite pleased with her result as is  Ms. Scalia.  She is to return to full activity aside from heavy lifting  which she will refrain from for an additional 2 months.  We will see her  again on an as-needed basis.  She will notify us should she develop any  wound problems or other issues.     Larina Earthly, M.D.  Electronically Signed   TFE/MEDQ  D:  02/23/2010  T:  02/24/2010  Job:  [id number]   cc:   Noralyn Pick. Eden Emms, MD, [facility]

## 2010-12-15 ENCOUNTER — Ambulatory Visit: Payer: Medicare Other | Admitting: Family Medicine

## 2010-12-20 ENCOUNTER — Encounter: Payer: Self-pay | Admitting: Family Medicine

## 2010-12-20 ENCOUNTER — Ambulatory Visit (INDEPENDENT_AMBULATORY_CARE_PROVIDER_SITE_OTHER): Payer: Medicare Other | Admitting: Family Medicine

## 2010-12-20 VITALS — BP 142/90 | HR 78 | Temp 96.6°F | Wt 175.4 lb

## 2010-12-20 DIAGNOSIS — Z0000 Encounter for general adult medical examination without abnormal findings: Secondary | ICD-10-CM

## 2010-12-20 DIAGNOSIS — E785 Hyperlipidemia, unspecified: Secondary | ICD-10-CM

## 2010-12-20 DIAGNOSIS — I10 Essential (primary) hypertension: Secondary | ICD-10-CM

## 2010-12-20 LAB — BASIC METABOLIC PANEL
BUN: 17 mg/dL (ref 6–23)
Chloride: 103 mEq/L (ref 96–112)
Creatinine, Ser: 1 mg/dL (ref 0.4–1.2)

## 2010-12-20 LAB — LIPID PANEL
Cholesterol: 122 mg/dL (ref 0–200)
LDL Cholesterol: 58 mg/dL (ref 0–99)
Triglycerides: 120 mg/dL (ref 0.0–149.0)

## 2010-12-20 LAB — HEPATIC FUNCTION PANEL
ALT: 12 U/L (ref 0–35)
Albumin: 3.8 g/dL (ref 3.5–5.2)
Total Bilirubin: 0.6 mg/dL (ref 0.3–1.2)

## 2010-12-20 MED ORDER — FENOFIBRATE 160 MG PO TABS: 160.0000 mg | ORAL_TABLET | Freq: Every day | ORAL | Status: DC

## 2010-12-20 MED ORDER — ZOSTER VACCINE LIVE 19400 UNT/0.65ML ~~LOC~~ SOLR: 0.6500 mL | Freq: Once | SUBCUTANEOUS | Status: DC

## 2010-12-20 MED ORDER — AMLODIPINE BESYLATE 10 MG PO TABS: 10.0000 mg | ORAL_TABLET | Freq: Every day | ORAL | Status: DC

## 2010-12-20 MED ORDER — SIMVASTATIN 40 MG PO TABS: 40.0000 mg | ORAL_TABLET | Freq: Every day | ORAL | Status: DC

## 2010-12-20 NOTE — Patient Instructions (Signed)

## 2010-12-20 NOTE — Progress Notes (Signed)
  Subjective:    Deanna [name] is a 77 y.o. female here for follow up of dyslipidemia. The patient does not use medications that may worsen dyslipidemias (corticosteroids, progestins, anabolic steroids, diuretics, beta-blockers, amiodarone, cyclosporine, olanzapine). The patient exercises infrequently. The patient is not known to have coexisting coronary artery disease.   Cardiac Risk Factors Age > 45-female, > 55-female:  YES  +1  Smoking:   NO  Sig. family hx of CHD*:  NO  Hypertension:   YES  +1  Diabetes:   NO  HDL < 35:   NO  HDL > 59:   NO  Total: 2   *- Sig. family h/o CHD per NCEP = MI or sudden death at <55yo in  father or other 1st-degree female relative, or <65yo in mother or  other 1st-degree female relative  The following portions of the patient's history were reviewed and updated as appropriate: allergies, current medications, past family history, past medical history, past social history, past surgical history and problem list.  Review of Systems Pertinent items are noted in HPI.    Objective:    BP 142/90  Pulse 78  Temp(Src) 96.6 F (35.9 C) (Oral)  Wt 175 lb 6.4 oz (79.561 kg)  SpO2 97% General appearance: alert, cooperative, appears stated age and no distress Lungs: clear to auscultation bilaterally Heart: S1, S2 normal Extremities: extremities normal, atraumatic, no cyanosis or edema  Lab Review Lab Results  Component Value Date   CHOL 130 06/12/2009   CHOL 192 11/11/2008   CHOL 208* 07/23/2008   HDL 39.10 06/12/2009   HDL 52.84* 11/11/2008   HDL 32.80* 07/23/2008   LDLDIRECT 103.8 11/11/2008   LDLDIRECT 128.8 07/23/2008   LDLDIRECT 139.5 04/08/2008      Assessment:  1  Dyslipidemia as detailed above with 2 CHD risk factors using NCEP scheme above.  Target levels for LDL are: < 100 mg/dl (CHD or "CHD risk equivalent" is present)  Explained to the patient the respective contributions of genetics, diet, and exercise to lipid levels and the use of  medication in severe cases which do not respond to lifestyle alteration. The patient's interest and motivation in making lifestyle changes seems good.  2 HTN-- stable   Plan:    The following changes are planned for the next 6 months, at which time the patient will return for repeat fasting lipids:  1. Dietary changes: Increase soluble fiber Plant sterols 2grams per day (e.g. Benecol): yes Reduce saturated fat, "trans" monounsaturated fatty acids, and cholesterol 2. Exercise changes:  encouraged 3x a week 3. Other treatment: Treatment of hypertension (norvasc) 4. Lipid-lowering medications: zocor fenobibrate  (Recommended by NCEP after 3-6 mos of dietary therapy & lifestyle modification,  except if CHD is present or LDL well above 190.) 5. Hormone replacement therapy (patient is a postmenopausal  woman): no 6. Screening for secondary causes of dyslipidemias: None indicated 7. Lipid screening for relatives: na 8. Follow up: 6 months.  Note: The majority of the visit was spent in counseling on the pathophysiology and treatment of dyslipidemias. The total face-to-face time was in excess of 25 minutes.

## 2011-01-03 ENCOUNTER — Other Ambulatory Visit: Payer: Self-pay | Admitting: Family Medicine

## 2011-01-04 LAB — CBC
Hemoglobin: 14.3
MCHC: 33.2
MCV: 86.9
RBC: 4.97

## 2011-01-20 ENCOUNTER — Other Ambulatory Visit: Payer: Self-pay | Admitting: Family Medicine

## 2011-01-20 DIAGNOSIS — I10 Essential (primary) hypertension: Secondary | ICD-10-CM

## 2011-01-20 DIAGNOSIS — E785 Hyperlipidemia, unspecified: Secondary | ICD-10-CM

## 2011-01-20 MED ORDER — FENOFIBRATE 160 MG PO TABS: 160.0000 mg | ORAL_TABLET | Freq: Every day | ORAL | Status: DC

## 2011-01-20 MED ORDER — AMLODIPINE BESYLATE 10 MG PO TABS: 10.0000 mg | ORAL_TABLET | Freq: Every day | ORAL | Status: DC

## 2011-01-20 NOTE — Telephone Encounter (Signed)
Pt requesting refill of amiodipine 10mg  and fenofibrale 160mg  sent to [facility] on [address]

## 2011-06-20 ENCOUNTER — Ambulatory Visit: Payer: Medicare Other | Admitting: Family Medicine

## 2011-06-24 ENCOUNTER — Other Ambulatory Visit: Payer: Self-pay | Admitting: *Deleted

## 2011-06-24 ENCOUNTER — Encounter: Payer: Self-pay | Admitting: Family Medicine

## 2011-06-24 ENCOUNTER — Ambulatory Visit (INDEPENDENT_AMBULATORY_CARE_PROVIDER_SITE_OTHER): Payer: Medicare Other | Admitting: Family Medicine

## 2011-06-24 VITALS — BP 126/76 | HR 89 | Temp 98.3°F | Wt 179.8 lb

## 2011-06-24 DIAGNOSIS — R739 Hyperglycemia, unspecified: Secondary | ICD-10-CM

## 2011-06-24 DIAGNOSIS — R7309 Other abnormal glucose: Secondary | ICD-10-CM

## 2011-06-24 DIAGNOSIS — E785 Hyperlipidemia, unspecified: Secondary | ICD-10-CM

## 2011-06-24 DIAGNOSIS — I10 Essential (primary) hypertension: Secondary | ICD-10-CM

## 2011-06-24 DIAGNOSIS — R319 Hematuria, unspecified: Secondary | ICD-10-CM

## 2011-06-24 LAB — LIPID PANEL
Cholesterol: 137 mg/dL (ref 0–200)
LDL Cholesterol: 69 mg/dL (ref 0–99)
Triglycerides: 131 mg/dL (ref 0.0–149.0)

## 2011-06-24 LAB — BASIC METABOLIC PANEL
BUN: 23 mg/dL (ref 6–23)
CO2: 28 mEq/L (ref 19–32)
Chloride: 103 mEq/L (ref 96–112)
Creatinine, Ser: 1 mg/dL (ref 0.4–1.2)

## 2011-06-24 LAB — POCT URINALYSIS DIPSTICK
Glucose, UA: NEGATIVE
Ketones, UA: NEGATIVE
Spec Grav, UA: 1.015
Urobilinogen, UA: 0.2

## 2011-06-24 LAB — HEMOGLOBIN A1C: Hgb A1c MFr Bld: 5.5 % (ref 4.6–6.5)

## 2011-06-24 LAB — HEPATIC FUNCTION PANEL
ALT: 18 U/L (ref 0–35)
Albumin: 4 g/dL (ref 3.5–5.2)
Total Bilirubin: 0.5 mg/dL (ref 0.3–1.2)

## 2011-06-24 MED ORDER — CIPROFLOXACIN HCL 500 MG PO TABS: 500.0000 mg | ORAL_TABLET | Freq: Two times a day (BID) | ORAL | Status: AC

## 2011-06-24 NOTE — Patient Instructions (Signed)

## 2011-06-24 NOTE — Progress Notes (Signed)
Addended by: Silvio Pate D on: 06/24/2011 01:32 PM   Modules accepted: Orders

## 2011-06-24 NOTE — Progress Notes (Signed)
  Subjective:    Patient here for follow-up of elevated blood pressure.  She is not exercising and is adherent to a low-salt diet.  Blood pressure is well controlled at home. Cardiac symptoms: none. Patient denies: chest pain, chest pressure/discomfort, claudication, dyspnea, exertional chest pressure/discomfort, fatigue, irregular heart beat, lower extremity edema, near-syncope, orthopnea, palpitations, paroxysmal nocturnal dyspnea, syncope and tachypnea. Cardiovascular risk factors: none. Use of agents associated with hypertension: none. History of target organ damage: none.  The following portions of the patient's history were reviewed and updated as appropriate: allergies, current medications, past family history, past medical history, past social history, past surgical history and problem list.  Review of Systems Pertinent items are noted in HPI.     Objective:    BP 126/76  Pulse 89  Temp(Src) 98.3 F (36.8 C) (Oral)  Wt 179 lb 12.8 oz (81.557 kg)  SpO2 96% General appearance: alert, cooperative, appears stated age and no distress Lungs: clear to auscultation bilaterally Heart: regular rate and rhythm, S1, S2 normal, no murmur, click, rub or gallop Extremities: extremities normal, atraumatic, no cyanosis or edema    Assessment:    Hypertension, normal blood pressure . Evidence of target organ damage: none.   hyperlipidemia  Plan:    Medication: no change. Dietary sodium restriction. Regular aerobic exercise. Follow up: 6 months and as needed.  con't meds, check labs

## 2011-06-27 LAB — URINE CULTURE: Colony Count: >=100000

## 2011-07-03 ENCOUNTER — Other Ambulatory Visit: Payer: Self-pay | Admitting: Family Medicine

## 2011-08-04 ENCOUNTER — Telehealth: Payer: Self-pay | Admitting: Cardiovascular Disease

## 2011-08-04 NOTE — Telephone Encounter (Signed)
New Problem:     I called the patient in an attempt to schedule an appointment and was told that, while she still wishes to be a patient of Dr. Eden Emms, she does not wish to schedule any appointment at this time.

## 2011-12-30 ENCOUNTER — Ambulatory Visit (INDEPENDENT_AMBULATORY_CARE_PROVIDER_SITE_OTHER): Payer: Medicare Other | Admitting: Family Medicine

## 2011-12-30 ENCOUNTER — Encounter: Payer: Self-pay | Admitting: Family Medicine

## 2011-12-30 VITALS — BP 124/80 | HR 84 | Temp 97.9°F | Wt 182.8 lb

## 2011-12-30 DIAGNOSIS — E785 Hyperlipidemia, unspecified: Secondary | ICD-10-CM

## 2011-12-30 DIAGNOSIS — I10 Essential (primary) hypertension: Secondary | ICD-10-CM

## 2011-12-30 DIAGNOSIS — R5381 Other malaise: Secondary | ICD-10-CM

## 2011-12-30 DIAGNOSIS — R29898 Other symptoms and signs involving the musculoskeletal system: Secondary | ICD-10-CM

## 2011-12-30 DIAGNOSIS — Z9181 History of falling: Secondary | ICD-10-CM

## 2011-12-30 LAB — HEPATIC FUNCTION PANEL
AST: 25 U/L (ref 0–37)
Albumin: 3.5 g/dL (ref 3.5–5.2)
Alkaline Phosphatase: 68 U/L (ref 39–117)
Bilirubin, Direct: 0.1 mg/dL (ref 0.0–0.3)
Total Bilirubin: 0.7 mg/dL (ref 0.3–1.2)

## 2011-12-30 LAB — LIPID PANEL
HDL: 24.7 mg/dL — ABNORMAL LOW (ref >39.00)
Total CHOL/HDL Ratio: 6
VLDL: 43.4 mg/dL — ABNORMAL HIGH (ref 0.0–40.0)

## 2011-12-30 LAB — BASIC METABOLIC PANEL
Calcium: 9.3 mg/dL (ref 8.4–10.5)
GFR: 49.71 mL/min — ABNORMAL LOW (ref >60.00)
Potassium: 4.1 mEq/L (ref 3.5–5.1)
Sodium: 139 mEq/L (ref 135–145)

## 2011-12-30 MED ORDER — AMLODIPINE BESYLATE 10 MG PO TABS: 10.0000 mg | ORAL_TABLET | Freq: Every day | ORAL | Status: DC

## 2011-12-30 MED ORDER — SIMVASTATIN 40 MG PO TABS: 40.0000 mg | ORAL_TABLET | Freq: Every day | ORAL | Status: DC

## 2011-12-30 MED ORDER — FENOFIBRATE 160 MG PO TABS: 160.0000 mg | ORAL_TABLET | Freq: Every day | ORAL | Status: DC

## 2011-12-30 NOTE — Progress Notes (Signed)
  Subjective:    Patient here for follow-up of elevated blood pressure.  She is exercising and is adherent to a low-salt diet.  Blood pressure is well controlled at home. Cardiac symptoms: none. Patient denies: chest pain, chest pressure/discomfort, claudication, dyspnea, exertional chest pressure/discomfort, fatigue, irregular heart beat, lower extremity edema, near-syncope, orthopnea, palpitations, paroxysmal nocturnal dyspnea, syncope and tachypnea. Cardiovascular risk factors: advanced age (older than 75 for men, 58 for women), dyslipidemia and hypertension. Use of agents associated with hypertension: none. History of target organ damage: none.  Pt recently had dental procedure recently and had twinge of Headache with gargling that is now gone.    Pt also c/o low back pain and leg weakness for a while now.  No new injury     The following portions of the patient's history were reviewed and updated as appropriate: allergies, current medications, past family history, past medical history, past social history, past surgical history and problem list.  Review of Systems As above Objective:     Filed Vitals:   12/30/11 1113  BP: 124/80  Pulse: 84  Temp: 97.9 F (36.6 C)  Gen--AAOx3 Cor -- S1S2 normal Lungs-- CTAB/l  No rrw Ext --  No CCE,   Strength in sitting position checked and is normal       Pt walking with cane but does not feel safe    Assessment:    Hypertension, stable . Marland Kitchen   Low ext weakness Plan:    stable con't meds Check labs

## 2011-12-30 NOTE — Patient Instructions (Signed)
Fall Prevention, Elderly Falls are the leading cause of injuries, accidents, and accidental deaths in people over the age of 65. Falling is a real threat to your ability to live on your own. CAUSES    Poor eyesight or poor hearing can make you more likely to fall.   Illnesses and physical conditions can affect your strength and balance.   Poor lighting, throw rugs and pets in your home can make you more likely to trip or slip.   The side effects of some medicines can upset your balance and lead to falling. These include medicines for depression, sleep problems, high blood pressure, diabetes, and heart conditions.  PREVENTION   Be sure your home is as safe as possible. Here are some tips:  Wear shoes with non-skid soles (not house slippers).   Be sure your home and outside area are well lit.   Use night lights throughout your house, including hallways and stairways.   Remove clutter and clean up spills on floors and walkways.   Remove throw rugs or fasten them to the floor with carpet tape. Tack down carpet edges.   Do not place electrical cords across pathways.   Install grab bars in your bathtub, shower, and toilet area. Towel bars should not be used as a grab bar.   Install handrails on both sides of stairways.   Do not climb on stools or stepladders. Get someone else to help with jobs that require climbing.   Do not wax your floors at all, or use a non-skid wax.   Repair uneven or unsafe sidewalks, walkways or stairs.   Keep frequently used items within reach.   Be aware of pets so you do not trip.  Get regular check-ups from your doctor, and take good care of yourself:  Have your eyes checked every year for vision changes, cataracts, glaucoma, and other eye problems. Wear eyeglasses as directed.   Have your hearing checked every 2 years, or anytime you or others think that you cannot hear well. Use hearing aids as directed.   See your caregiver if you have foot pain  or corns. Sore feet can contribute to falls.   Let your caregiver know if a medicine is making you feel dizzy or making you lose your balance.   Use a cane, walker, or wheelchair as directed. Use walker or wheelchair brakes when getting in and out.   When you get up from bed, sit on the side of the bed for 1 to 2 minutes before you stand up. This will give your blood pressure time to adjust, and you will feel less dizzy.   If you need to go to the bathroom often, consider using a bedside commode.  Keep your body in good shape:  Get regular exercise, especially walking.   Do exercises to strengthen the muscles you use for walking and lifting.   Do not smoke.   Minimize use of alcohol.  SEEK IMMEDIATE MEDICAL CARE IF:    You feel dizzy, weak, or unsteady on your feet.   You feel confused.   You fall.  Document Released: 03/21/2005 Document Revised: 03/10/2011 Document Reviewed: 09/15/2006 [employer] Patient Information 2012 [employer]. 

## 2012-01-01 DIAGNOSIS — R29898 Other symptoms and signs involving the musculoskeletal system: Secondary | ICD-10-CM | POA: Insufficient documentation

## 2012-01-01 NOTE — Assessment & Plan Note (Signed)
--   PT evaluation 

## 2012-01-05 ENCOUNTER — Ambulatory Visit: Payer: Medicare Other | Attending: Family Medicine | Admitting: Rehabilitation

## 2012-01-05 DIAGNOSIS — M6281 Muscle weakness (generalized): Secondary | ICD-10-CM | POA: Insufficient documentation

## 2012-01-05 DIAGNOSIS — IMO0001 Reserved for inherently not codable concepts without codable children: Secondary | ICD-10-CM | POA: Insufficient documentation

## 2012-01-05 NOTE — Progress Notes (Signed)
Did something change with her diet etc?   TG are high.   If nothing has changed we may need to change fenofibrate to something not generic or she can try fish oil / flaxseed oil--- recheck 3 months---272.4  Lipid, hep

## 2012-01-10 ENCOUNTER — Telehealth: Payer: Self-pay | Admitting: Family Medicine

## 2012-01-10 NOTE — Telephone Encounter (Signed)
Notes Recorded by Arnette Norris, CMA on 01/05/2012 at 5:25 PM Spoke with patient and she stated she takes her medication daily (both). Please advise KP ------  Notes Recorded by Lelon Perla, DO on 01/03/2012 at 8:47 PM Was pt taking fenofibrate and zocor?--- I know i just refilled it---- TG and ratio increased a lot. I think I remember her saying she had run out/ stopped med. If that is the case. Repeat 3 months---272.4 Lipid hep

## 2012-01-10 NOTE — Telephone Encounter (Signed)
Discussed with Dr.Lowne and patient can take 2 g fish oil twice a day or 2 g of fish oil and add flax seed oil. Patient voiced understanding.      KP

## 2012-01-10 NOTE — Telephone Encounter (Signed)
Pt has questions and needs to know about what to do about the tri????. Please call her

## 2012-01-11 ENCOUNTER — Ambulatory Visit: Payer: Medicare Other | Admitting: Rehabilitation

## 2012-01-13 ENCOUNTER — Ambulatory Visit: Payer: Medicare Other | Admitting: Rehabilitation

## 2012-01-18 ENCOUNTER — Ambulatory Visit: Payer: Medicare Other | Admitting: Rehabilitation

## 2012-01-20 ENCOUNTER — Ambulatory Visit: Payer: Medicare Other | Admitting: Rehabilitation

## 2012-02-16 ENCOUNTER — Encounter: Payer: Self-pay | Admitting: Vascular Surgery

## 2012-03-06 ENCOUNTER — Encounter (HOSPITAL_COMMUNITY): Payer: Self-pay

## 2012-03-06 ENCOUNTER — Telehealth: Payer: Self-pay | Admitting: Family Medicine

## 2012-03-06 ENCOUNTER — Inpatient Hospital Stay (HOSPITAL_COMMUNITY)
Admission: EM | Admit: 2012-03-06 | Discharge: 2012-03-08 | DRG: 395 | Disposition: A | Discharge status: Home / Self Care | Payer: Medicare Other | Attending: Internal Medicine | Admitting: Internal Medicine

## 2012-03-06 ENCOUNTER — Observation Stay (HOSPITAL_COMMUNITY): Payer: Medicare Other

## 2012-03-06 DIAGNOSIS — R945 Abnormal results of liver function studies: Secondary | ICD-10-CM

## 2012-03-06 DIAGNOSIS — R011 Cardiac murmur, unspecified: Secondary | ICD-10-CM

## 2012-03-06 DIAGNOSIS — I10 Essential (primary) hypertension: Secondary | ICD-10-CM

## 2012-03-06 DIAGNOSIS — R7402 Elevation of levels of lactic acid dehydrogenase (LDH): Secondary | ICD-10-CM

## 2012-03-06 DIAGNOSIS — K55039 Acute (reversible) ischemia of large intestine, extent unspecified: Secondary | ICD-10-CM

## 2012-03-06 DIAGNOSIS — K5730 Diverticulosis of large intestine without perforation or abscess without bleeding: Secondary | ICD-10-CM | POA: Diagnosis present

## 2012-03-06 DIAGNOSIS — R5383 Other fatigue: Secondary | ICD-10-CM

## 2012-03-06 DIAGNOSIS — E860 Dehydration: Secondary | ICD-10-CM

## 2012-03-06 DIAGNOSIS — Z87891 Personal history of nicotine dependence: Secondary | ICD-10-CM

## 2012-03-06 DIAGNOSIS — F17200 Nicotine dependence, unspecified, uncomplicated: Secondary | ICD-10-CM

## 2012-03-06 DIAGNOSIS — D72829 Elevated white blood cell count, unspecified: Secondary | ICD-10-CM | POA: Diagnosis present

## 2012-03-06 DIAGNOSIS — E875 Hyperkalemia: Secondary | ICD-10-CM

## 2012-03-06 DIAGNOSIS — G4733 Obstructive sleep apnea (adult) (pediatric): Secondary | ICD-10-CM

## 2012-03-06 DIAGNOSIS — M79609 Pain in unspecified limb: Secondary | ICD-10-CM

## 2012-03-06 DIAGNOSIS — K625 Hemorrhage of anus and rectum: Secondary | ICD-10-CM

## 2012-03-06 DIAGNOSIS — K55059 Acute (reversible) ischemia of intestine, part and extent unspecified: Principal | ICD-10-CM | POA: Diagnosis present

## 2012-03-06 DIAGNOSIS — J189 Pneumonia, unspecified organism: Secondary | ICD-10-CM

## 2012-03-06 DIAGNOSIS — R7309 Other abnormal glucose: Secondary | ICD-10-CM

## 2012-03-06 DIAGNOSIS — R29898 Other symptoms and signs involving the musculoskeletal system: Secondary | ICD-10-CM

## 2012-03-06 DIAGNOSIS — R5381 Other malaise: Secondary | ICD-10-CM

## 2012-03-06 DIAGNOSIS — R9431 Abnormal electrocardiogram [ECG] [EKG]: Secondary | ICD-10-CM

## 2012-03-06 DIAGNOSIS — K745 Biliary cirrhosis, unspecified: Secondary | ICD-10-CM

## 2012-03-06 DIAGNOSIS — K219 Gastro-esophageal reflux disease without esophagitis: Secondary | ICD-10-CM

## 2012-03-06 DIAGNOSIS — R040 Epistaxis: Secondary | ICD-10-CM

## 2012-03-06 DIAGNOSIS — E785 Hyperlipidemia, unspecified: Secondary | ICD-10-CM

## 2012-03-06 HISTORY — DX: Acute (reversible) ischemia of large intestine, extent unspecified: K55.039

## 2012-03-06 LAB — COMPREHENSIVE METABOLIC PANEL
ALT: 17 U/L (ref 0–35)
AST: 27 U/L (ref 0–37)
Albumin: 3.7 g/dL (ref 3.5–5.2)
Calcium: 10 mg/dL (ref 8.4–10.5)
Creatinine, Ser: 1.07 mg/dL (ref 0.50–1.10)
Sodium: 138 mEq/L (ref 135–145)

## 2012-03-06 LAB — CBC
Hemoglobin: 14.3 g/dL (ref 12.0–15.0)
MCH: 29.1 pg (ref 26.0–34.0)
MCV: 86.8 fL (ref 78.0–100.0)
Platelets: 215 10*3/uL (ref 150–400)
RBC: 4.94 MIL/uL (ref 3.87–5.11)
RDW: 14.2 % (ref 11.5–15.5)
WBC: 11.2 10*3/uL — ABNORMAL HIGH (ref 4.0–10.5)

## 2012-03-06 LAB — TYPE AND SCREEN: DAT, IgG: NEGATIVE

## 2012-03-06 LAB — PROTIME-INR
INR: 1 (ref 0.00–1.49)
Prothrombin Time: 13.1 seconds (ref 11.6–15.2)

## 2012-03-06 LAB — POTASSIUM: Potassium: 4.4 mEq/L (ref 3.5–5.1)

## 2012-03-06 MED ORDER — VITAMIN B-1 100 MG PO TABS
100.0000 mg | ORAL_TABLET | Freq: Every day | ORAL | Status: DC
  Administered 2012-03-07 – 2012-03-08 (×2): 100 mg via ORAL
  Filled 2012-03-06 (×2): qty 1

## 2012-03-06 MED ORDER — FOLIC ACID 0.5 MG HALF TAB
0.5000 mg | ORAL_TABLET | Freq: Every day | ORAL | Status: DC
  Administered 2012-03-07 – 2012-03-08 (×2): 0.5 mg via ORAL
  Filled 2012-03-06 (×2): qty 1

## 2012-03-06 MED ORDER — SIMVASTATIN 40 MG PO TABS
40.0000 mg | ORAL_TABLET | Freq: Every day | ORAL | Status: DC
  Administered 2012-03-06 – 2012-03-07 (×2): 40 mg via ORAL
  Filled 2012-03-06 (×3): qty 1

## 2012-03-06 MED ORDER — FOLIC ACID 400 MCG PO TABS: 400.0000 ug | ORAL_TABLET | Freq: Every day | ORAL | Status: DC

## 2012-03-06 MED ORDER — ALBUTEROL SULFATE (5 MG/ML) 0.5% IN NEBU: 2.5000 mg | INHALATION_SOLUTION | RESPIRATORY_TRACT | Status: DC | PRN

## 2012-03-06 MED ORDER — SODIUM CHLORIDE 0.9 % IV SOLN
Freq: Once | INTRAVENOUS | Status: AC
  Administered 2012-03-06: 16:00:00 via INTRAVENOUS

## 2012-03-06 MED ORDER — FENOFIBRATE 160 MG PO TABS
160.0000 mg | ORAL_TABLET | Freq: Every day | ORAL | Status: DC
  Administered 2012-03-06 – 2012-03-07 (×2): 160 mg via ORAL
  Filled 2012-03-06 (×3): qty 1

## 2012-03-06 MED ORDER — ONDANSETRON HCL 4 MG PO TABS: 4.0000 mg | ORAL_TABLET | Freq: Four times a day (QID) | ORAL | Status: DC | PRN

## 2012-03-06 MED ORDER — SODIUM CHLORIDE 0.9 % IV SOLN
INTRAVENOUS | Status: DC
  Administered 2012-03-06 – 2012-03-07 (×2): via INTRAVENOUS

## 2012-03-06 MED ORDER — ACETAMINOPHEN 650 MG RE SUPP: 650.0000 mg | Freq: Four times a day (QID) | RECTAL | Status: DC | PRN

## 2012-03-06 MED ORDER — OMEGA-3 FATTY ACIDS 1000 MG PO CAPS: 2.0000 g | ORAL_CAPSULE | Freq: Every day | ORAL | Status: DC

## 2012-03-06 MED ORDER — ACETAMINOPHEN 325 MG PO TABS: 650.0000 mg | ORAL_TABLET | Freq: Four times a day (QID) | ORAL | Status: DC | PRN

## 2012-03-06 MED ORDER — VITAMIN B-12 1000 MCG PO TABS
1000.0000 ug | ORAL_TABLET | Freq: Every day | ORAL | Status: DC
  Administered 2012-03-07 – 2012-03-08 (×2): 1000 ug via ORAL
  Filled 2012-03-06 (×2): qty 1

## 2012-03-06 MED ORDER — SODIUM CHLORIDE 0.9 % IJ SOLN
3.0000 mL | Freq: Two times a day (BID) | INTRAMUSCULAR | Status: DC
  Administered 2012-03-07 – 2012-03-08 (×3): 3 mL via INTRAVENOUS

## 2012-03-06 MED ORDER — ONDANSETRON HCL 4 MG/2ML IJ SOLN: 4.0000 mg | Freq: Four times a day (QID) | INTRAMUSCULAR | Status: DC | PRN

## 2012-03-06 MED ORDER — OMEGA-3-ACID ETHYL ESTERS 1 G PO CAPS
2.0000 g | ORAL_CAPSULE | Freq: Every day | ORAL | Status: DC
  Administered 2012-03-07 – 2012-03-08 (×2): 2 g via ORAL
  Filled 2012-03-06 (×2): qty 2

## 2012-03-06 NOTE — ED Notes (Signed)
Deanna [name], son, [phone]

## 2012-03-06 NOTE — Telephone Encounter (Signed)
Patient Information:  Caller Name: Marylene Land  Phone: [phone]  Patient: Deanna [name], Deanna [name]  Gender: Female  DOB: 1929-12-22  Age: 78 Years  PCP: Lelon Perla.   Symptoms  Reason For Call & Symptoms: on the way to ED; diarrhea, bright red bleeding.  pale and weak;  Reviewed Health History In EMR: N/A  Reviewed Medications In EMR: N/A  Reviewed Allergies In EMR: N/A  Reviewed Surgeries / Procedures: N/A  Date of Onset of Symptoms: 03/05/2012  Guideline(s) Used:  Rectal Bleeding  Disposition Per Guideline:   Go to ED Now (or to Office with PCP Approval)  Reason For Disposition Reached:   Pale skin (pallor) of new onset or worsening  Advice Given:  N/A  Office Follow Up:  Does the office need to follow up with this patient?: No  Instructions For The Office: N/A  RN Note:  ED now/[facility].  Family is on the way to ED now.  krs/can

## 2012-03-06 NOTE — ED Notes (Signed)
Pt states she is having bleeding. Pt states she is having urgency of having to have BM. Pt states it started yesterday. Pt states "theres no stool, it's just a flood of red." Denies pain in rectum. Denies urinary sx. Pt states she has mild abdominal pain. Pt has hx of upper GI bleed 20+ years ago.

## 2012-03-06 NOTE — Telephone Encounter (Signed)
Patient has been sent to the ED and had been arrived per Epic.      KP

## 2012-03-06 NOTE — H&P (Signed)
[facility] History and Physical  Deanna [name] ZOX:[id number] DOB: 08-21-1929 DOA: 03/06/2012  Referring physician: Dr. Zadie Rhine PCP: Loreen Freud, DO  Specialists: [facility]  Chief Complaint: Rectal bleeding  HPI: Deanna [name] is a 78 y.o. female with history of HTN, hyperlipidemia, primary biliary cirrhosis (followed at [facility]), and remote history of upper GI bleed in the 80s, AAA repair 3 years ago, presents to ED with bright red bleeding per rectum. Patient was in usual state of health until 2 days ago when she noticed urge to defecate and even before she reached the toilet, she squirted "handful" of bright red blood on the floor. Subsequently she noticed blood in urine in the bowl with some stools. She denies any rectal pain. Some lower abdominal discomfort. No fever or chills. Since then she has had 5-6 episodes of rectal bleed daily for 2 days. Now there is no stools but just blood with small clots. She quantifies about a handful of blood during each episode. She denies recent exposure to antibiotics or any family members with similar complaints. She is only on a baby aspirin but no anticoagulants. No nausea, vomiting or other abdominal pain. She felt dizzy after the last episode but did not pass out. In the ED rectal exam by ED staff showed bright red blood without hemorrhoids or other acute findings. [facility] was consulted by EDP and will see patient tomorrow morning-and are to be called overnight if there is any decline in patient's situation. Patient is hemodynamically stable. Hemoglobin is 15.2. The hospitalist service is requested to admit for further evaluation and management.   Review of Systems: All systems reviewed and apart from history of presenting illness, is pertinent for poor oral intake of liquids and decreased urine output today. All other systems negative.  Past Medical History  Diagnosis Date  . HLD (hyperlipidemia)    . HTN (hypertension)   . AAA (abdominal aortic aneurysm)   . Cirrhosis, biliary   . Fatigue   . Epistaxis   . OSA (obstructive sleep apnea)   . Leg pain     bilateral  . GERD (gastroesophageal reflux disease)   . Pneumonia   . Bleeding ulcer 2000    H. Pylori   Past Surgical History  Procedure Date  . Iliac artery - femoral artery bypass graft   . Cataract extraction   . Breast biopsy    Social History:  reports that she quit smoking about 2 years ago. Her smoking use included Cigarettes. She quit after 52 years of use. She does not have any smokeless tobacco history on file. She reports that she does not drink alcohol or use illicit drugs. Widowed. Lives with her daughter. Independent of activities of daily living.  No Known Allergies  No family history on file.  Patient's sister recently demised of colon cancer.   Prior to Admission medications   Medication Sig Start Date End Date Taking? Authorizing Provider  amLODipine (NORVASC) 10 MG tablet Take 10 mg by mouth daily. 12/30/11  Yes Lelon Perla, DO  Ascorbic Acid (VITAMIN C) 500 MG tablet Take 500 mg by mouth daily.     Yes Historical Provider, MD  aspirin 81 MG tablet Take 81 mg by mouth daily.     Yes Historical Provider, MD  B Complex-C (SUPER B COMPLEX PO) Take 1 tablet by mouth daily.   Yes Historical Provider, MD  Cholecalciferol (VITAMIN D-3 PO) Take 1 tablet by mouth 2 (two) times daily.  Yes Historical Provider, MD  co-enzyme Q-10 30 MG capsule Take 30 mg by mouth daily.    Yes Historical Provider, MD  fenofibrate 160 MG tablet Take 160 mg by mouth at bedtime. 12/30/11  Yes Grayling Congress Lowne, DO  fish oil-omega-3 fatty acids 1000 MG capsule Take 2 g by mouth daily.     Yes Historical Provider, MD  Flaxseed, Linseed, (FLAXSEED OIL PO) Take 1 capsule by mouth daily.   Yes Historical Provider, MD  folic acid (FOLVITE) 400 MCG tablet Take 400 mcg by mouth daily.     Yes Historical Provider, MD  Nutritional  Supplements (OSTEO ADVANCE) TABS Take 1 tablet by mouth daily.   Yes Historical Provider, MD  simvastatin (ZOCOR) 40 MG tablet Take 40 mg by mouth at bedtime. 12/30/11  Yes Grayling Congress Lowne, DO  Thiamine HCl (VITAMIN B-1) 100 MG tablet Take 100 mg by mouth daily.     Yes Historical Provider, MD  vitamin A 40981 UNIT capsule Take 10,000 Units by mouth daily.     Yes Historical Provider, MD  vitamin B-12 (CYANOCOBALAMIN) 1000 MCG tablet Take 1,000 mcg by mouth daily.     Yes Historical Provider, MD  vitamin E 400 UNIT capsule Take 400 Units by mouth daily.     Yes Historical Provider, MD   Physical Exam: Filed Vitals:   03/06/12 1545 03/06/12 1550 03/06/12 1553 03/06/12 1604  BP: 121/94 106/64 104/53 126/71  Pulse: 75 94 98 74  Temp:      TempSrc:      Resp:    20  Weight:      SpO2:    96%     General exam: Moderately built and nourished female patient lying comfortably supine on bed.  Head, eyes and ENT: Nontraumatic and normocephalic. Pupils equally reacting to light and accommodation.  Lymphatics: No lymphadenopathy.  Neck: Supple. No JVD, carotid bruit or thyromegaly.  Respiratory system: Clear to auscultation. No increased work of breathing.  Cardiovascular system: First and second heart sounds heard, regular rate and rhythm. No JVD, murmurs or gallops. Trace bilateral ankle edema.  Gastrointestinal system: Abdomen is nondistended, soft and nontender. Normal bowel sounds heard. Midline laparotomy scar. No organomegaly or masses appreciated.  Central nervous system: Alert and oriented. No focal neurological deficits.  Extremities: Symmetric 5 x 5 power. Peripheral pulses symmetrically felt.  Skin: No acute findings.  Muscular skeletal exam: negative.  Psychiatry: Pleasant and cooperative.   Labs on Admission:  Basic Metabolic Panel:  Lab 03/06/12 1914 03/06/12 1316  NA -- 138  K 4.4 5.2*  CL -- 102  CO2 -- 28  GLUCOSE -- 116*  BUN -- 28*  CREATININE -- 1.07   CALCIUM -- 10.0  MG -- --  PHOS -- --   Liver Function Tests:  Lab 03/06/12 1316  AST 27  ALT 17  ALKPHOS 92  BILITOT 0.5  PROT 7.7  ALBUMIN 3.7   No results found for this basename: LIPASE:5,AMYLASE:5 in the last 168 hours No results found for this basename: AMMONIA:5 in the last 168 hours CBC:  Lab 03/06/12 1316  WBC 11.2*  NEUTROABS --  HGB 15.2*  HCT 45.4  MCV 86.8  PLT 215   Cardiac Enzymes: No results found for this basename: CKTOTAL:5,CKMB:5,CKMBINDEX:5,TROPONINI:5 in the last 168 hours  BNP (last 3 results) No results found for this basename: PROBNP:3 in the last 8760 hours CBG: No results found for this basename: GLUCAP:5 in the last 168 hours  Radiological Exams  on Admission: Dg Abd 2 Views  03/06/2012  *RADIOLOGY REPORT*  Clinical Data: Rectal bleeding for 2 days.  ABDOMEN - 2 VIEW  Comparison: Abdominal pelvic CT 01/26/2010.  Findings: The bowel gas pattern is normal.  There is no free intraperitoneal air.  Scattered vascular calcifications are noted. There are degenerative changes throughout the lumbar spine.  IMPRESSION: No acute abdominal findings.   Original Report Authenticated By: Carey Bullocks, M.D.      Assessment/Plan Principal Problem:  *Bright red rectal bleeding Active Problems:  HYPERLIPIDEMIA  HYPERTENSION  GERD  BILIARY CIRRHOSIS, PRIMARY  Hyperkalemia  Dehydration   1. Bright red bleeding per rectum: Many possibilities-diverticular, hemorrhoidal, AVMs, colitis, malignancy. Colonoscopy 3 years ago said to be normal. Admit for observation and evaluation. Place on clear liquid diet. [facility] has been consulted by EDP and will see on 12/4. However if patient declines overnight, to call them ASAP. Hold aspirin. Check CBCs Q6H.  2. Mild hyperkalemia: Lab error versus resolved. Patient not on potassium supplements, ACE inhibitors or ARB's. 3. Dehydration: With associated reduced urine output. IV fluids and monitor intake output. 4. Mild  leukocytosis: Probably stress-related. No clinical focus of infection. Monitor CBCs. 5. Hypertension: Patient briefly had soft blood pressures in the ED. Temporarily hold amlodipine given her rectal bleeding. 6. History of primary biliary cirrhosis: Followed at [facility].    Code Status: full  Family Communication: and discussed with patient's daughter/health care power of attorney Ms. Osborne Casco at bedside. Disposition Plan: less than 2 midnight. Home when medically stable.   Time spent:  50 minutes   [facility] Pager [phone]   If 7PM-7AM, please contact night-coverage [email or web address] Password [facility] 03/06/2012, 5:56 PM

## 2012-03-06 NOTE — ED Notes (Signed)
Pt states she is having rectal bleeding, denies taking any blood thinners. C/o abd pain with the urge to have a bowel movement. Denies any vomiting but does have some nausea. Denies any dizziness. C/o weakness when the bleeding started with bright red blood.

## 2012-03-06 NOTE — ED Provider Notes (Signed)
Pt seen with PA She is here for painless rectal bleeding She is well appearing, abdomen soft Will need admission for observation She is currently stable  Joya Gaskins, MD 03/06/12 1631

## 2012-03-06 NOTE — ED Provider Notes (Signed)
History     CSN: [id number]  Arrival date & time 03/06/12  1303   First MD Initiated Contact with Patient 03/06/12 1511      Chief Complaint  Patient presents with  . Rectal Bleeding    (Consider location/radiation/quality/duration/timing/severity/associated sxs/prior treatment) HPI Deanna [name] is a 78 y.o. female who presents with complaint of rectal bleeding. Pt states 2 days ago, she developed some lower abdominal cramping, has urge to use a bathroom. States no stool comes out, only bright red "chunks and clots." States no abdominal pain other than when she gets the cramping and urges to go. Pt states she does feel weak and dizzy. Denies being on blood thinners. States did have upper GI bleed and ulcers but that was 30 years ago. Denies prior hx of the same. Denies n/v. No fever. No hx of diverticulitis or hemorrhoids. Pt and her daughter called her GI doctor, [facility], who told them to come here.   Past Medical History  Diagnosis Date  . HLD (hyperlipidemia)   . HTN (hypertension)   . AAA (abdominal aortic aneurysm)   . Cirrhosis, biliary   . Fatigue   . Epistaxis   . OSA (obstructive sleep apnea)   . Leg pain     bilateral  . GERD (gastroesophageal reflux disease)   . Pneumonia   . Bleeding ulcer 2000    H. Pylori    Past Surgical History  Procedure Date  . Iliac artery - femoral artery bypass graft   . Cataract extraction   . Breast biopsy     No family history on file.  History  Substance Use Topics  . Smoking status: Former Smoker -- 52 years    Types: Cigarettes    Quit date: 02/01/2010  . Smokeless tobacco: Not on file  . Alcohol Use: No    OB History    Grav Para Term Preterm Abortions TAB SAB Ect Mult Living                  Review of Systems  Constitutional: Positive for fatigue. Negative for fever, chills, activity change and appetite change.  Eyes: Negative for visual disturbance.  Respiratory: Negative.   Cardiovascular: Negative.    Gastrointestinal: Positive for abdominal pain, blood in stool and anal bleeding. Negative for nausea, vomiting, diarrhea and constipation.  Genitourinary: Negative for dysuria.  Musculoskeletal: Negative for back pain.  Skin: Negative.   Neurological: Positive for dizziness, weakness and light-headedness. Negative for headaches.  Hematological: Does not bruise/bleed easily.    Allergies  Review of patient's allergies indicates no known allergies.  Home Medications   Current Outpatient Rx  Name  Route  Sig  Dispense  Refill  . AMLODIPINE BESYLATE 10 MG PO TABS   Oral   Take 1 tablet (10 mg total) by mouth daily.   90 tablet   3   . VITAMIN C 500 MG PO TABS   Oral   Take 500 mg by mouth daily.           . ASPIRIN 81 MG PO TABS   Oral   Take 81 mg by mouth daily.           Marland Kitchen VITAMIN D-3 PO   Oral   Take 1 tablet by mouth 2 (two) times daily.           Marland Kitchen COENZYME Q10 30 MG PO CAPS   Oral   Take 30 mg by mouth 3 (three) times daily.           Marland Kitchen  FENOFIBRATE 160 MG PO TABS   Oral   Take 1 tablet (160 mg total) by mouth daily.   90 tablet   3   . OMEGA-3 FATTY ACIDS 1000 MG PO CAPS   Oral   Take 2 g by mouth daily.           Marland Kitchen FOLIC ACID 400 MCG PO TABS   Oral   Take 400 mcg by mouth daily.           Lanetta Inch ADVANCE PO TABS   Oral   Take 1 tablet by mouth daily.         Marland Kitchen SIMVASTATIN 40 MG PO TABS   Oral   Take 1 tablet (40 mg total) by mouth at bedtime.   90 tablet   3   . VITAMIN B-1 100 MG PO TABS   Oral   Take 100 mg by mouth daily.           Marland Kitchen VITAMIN A 16109 UNITS PO CAPS   Oral   Take 10,000 Units by mouth daily.           Marland Kitchen VITAMIN B-12 1000 MCG PO TABS   Oral   Take 1,000 mcg by mouth daily.           Marland Kitchen VITAMIN E 400 UNITS PO CAPS   Oral   Take 400 Units by mouth daily.           Marland Kitchen ZOSTER VACCINE LIVE 19400 UNT/0.65ML [place] SOLR   Subcutaneous   Inject 19,400 Units into the skin once.   1 vial   0     BP 125/80   Pulse 87  Temp 97.3 F (36.3 C) (Oral)  Resp 16  Wt 190 lb (86.183 kg)  SpO2 95%  Physical Exam  Nursing note and vitals reviewed. Constitutional: She is oriented to person, place, and time. She appears well-developed and well-nourished. No distress.  Eyes: Conjunctivae normal are normal.  Cardiovascular: Normal rate, regular rhythm and normal heart sounds.   Pulmonary/Chest: Effort normal and breath sounds normal. No respiratory distress. She has no wheezes.  Abdominal: Soft. Bowel sounds are normal. She exhibits no distension. There is no tenderness. There is no rebound and no guarding.  Genitourinary:       No external hemorrhoids. Bright red blood per rectum. No tenderness  Neurological: She is alert and oriented to person, place, and time.  Skin: Skin is warm and dry.    ED Course  Procedures (including critical care time)  3:47 PM Pt with bright red blood per rectum. She is non toxic appearing. Normal VS. Lab pending. No abdominal tenderness. No hx of the same.   Results for orders placed during the hospital encounter of 03/06/12  CBC      Component Value Range   WBC 11.2 (*) 4.0 - 10.5 K/uL   RBC 5.23 (*) 3.87 - 5.11 MIL/uL   Hemoglobin 15.2 (*) 12.0 - 15.0 g/dL   HCT 60.4  54.0 - 98.1 %   MCV 86.8  78.0 - 100.0 fL   MCH 29.1  26.0 - 34.0 pg   MCHC 33.5  30.0 - 36.0 g/dL   RDW 19.1  47.8 - 29.5 %   Platelets 215  150 - 400 K/uL  COMPREHENSIVE METABOLIC PANEL      Component Value Range   Sodium 138  135 - 145 mEq/L   Potassium 5.2 (*) 3.5 - 5.1 mEq/L   Chloride 102  96 -  112 mEq/L   CO2 28  19 - 32 mEq/L   Glucose, Bld 116 (*) 70 - 99 mg/dL   BUN 28 (*) 6 - 23 mg/dL   Creatinine, Ser 1.61  0.50 - 1.10 mg/dL   Calcium 09.6  8.4 - 04.5 mg/dL   Total Protein 7.7  6.0 - 8.3 g/dL   Albumin 3.7  3.5 - 5.2 g/dL   AST 27  0 - 37 U/L   ALT 17  0 - 35 U/L   Alkaline Phosphatase 92  39 - 117 U/L   Total Bilirubin 0.5  0.3 - 1.2 mg/dL   GFR calc non Af Amer 48 (*)  >90 mL/min   GFR calc Af Amer 55 (*) >90 mL/min  OCCULT BLOOD, POC DEVICE      Component Value Range   Fecal Occult Bld POSITIVE     No results found.  Filed Vitals:   03/06/12 1308 03/06/12 1545 03/06/12 1550 03/06/12 1553  BP: 125/80 121/94 106/64 104/53  Pulse: 87 75 94 98  Temp: 97.3 F (36.3 C)     TempSrc: Oral     Resp: 16     Weight: 190 lb (86.183 kg)     SpO2: 95%      4:46 PM Pt with bright red blood per rectum. Pt in NAD, hemodynamically stable. Spoke with [facility], will admit. Asked for an ECG for hyperkalemia.     5:18 PM Spoke with [facility], will see pt in the morning. If an emergency, page Physician on call.    1. Rectal bleed   2. Bright red rectal bleeding   3. Hyperkalemia   4. Dehydration   5. Leukocytosis       MDM          Lottie Mussel, PA 03/07/12 0147

## 2012-03-07 ENCOUNTER — Encounter (HOSPITAL_COMMUNITY): Payer: Self-pay | Admitting: Physician Assistant

## 2012-03-07 DIAGNOSIS — K745 Biliary cirrhosis, unspecified: Secondary | ICD-10-CM

## 2012-03-07 LAB — CBC
HCT: 39 % (ref 36.0–46.0)
HCT: 39.8 % (ref 36.0–46.0)
Hemoglobin: 12.9 g/dL (ref 12.0–15.0)
MCH: 28.1 pg (ref 26.0–34.0)
MCHC: 32.4 g/dL (ref 30.0–36.0)
MCV: 86.7 fL (ref 78.0–100.0)
RBC: 4.5 MIL/uL (ref 3.87–5.11)
WBC: 8.8 10*3/uL (ref 4.0–10.5)

## 2012-03-07 LAB — BASIC METABOLIC PANEL
BUN: 22 mg/dL (ref 6–23)
Calcium: 8.9 mg/dL (ref 8.4–10.5)
GFR calc non Af Amer: 55 mL/min — ABNORMAL LOW (ref >90)
Glucose, Bld: 94 mg/dL (ref 70–99)
Potassium: 4.1 mEq/L (ref 3.5–5.1)

## 2012-03-07 MED ORDER — PEG 3350-KCL-NA BICARB-NACL 420 G PO SOLR
2000.0000 mL | Freq: Once | ORAL | Status: AC
  Administered 2012-03-07: 2000 mL via ORAL
  Filled 2012-03-07: qty 4000

## 2012-03-07 MED ORDER — METOCLOPRAMIDE HCL 5 MG/ML IJ SOLN
10.0000 mg | Freq: Once | INTRAMUSCULAR | Status: AC
  Administered 2012-03-07: 10 mg via INTRAVENOUS
  Filled 2012-03-07 (×2): qty 2

## 2012-03-07 MED ORDER — PEG 3350-KCL-NA BICARB-NACL 420 G PO SOLR
2000.0000 mL | Freq: Once | ORAL | Status: AC
  Administered 2012-03-08: 2000 mL via ORAL
  Filled 2012-03-07 (×2): qty 4000

## 2012-03-07 MED ORDER — METOCLOPRAMIDE HCL 5 MG/ML IJ SOLN
10.0000 mg | Freq: Once | INTRAMUSCULAR | Status: AC
  Administered 2012-03-08: 10 mg via INTRAVENOUS
  Filled 2012-03-07: qty 2

## 2012-03-07 MED ORDER — SODIUM CHLORIDE 0.9 % IV SOLN: INTRAVENOUS | Status: DC

## 2012-03-07 NOTE — Procedures (Signed)
04/2008   Colonoscopy with polypectomy. Sigmoid diverticulosis noted.  Poor prep.  Polyp path: tubular adenoma and hyperplastic polyps. Dr Marny Lowenstein did this study at [facility].  This was screening study.   She did not have an EGD

## 2012-03-07 NOTE — Progress Notes (Signed)
PATIENT DETAILS Name: Deanna [name] Age: 78 y.o. Sex: female Date of Birth: December 04, 1929 Admit Date: 03/06/2012 Admitting Physician Elease Etienne, MD AOZ:[id number] Laury Axon, DO  Subjective: Minimal rectal bleeding continues today as well.  Assessment/Plan: Principal Problem:  *Bright red rectal bleeding -possible diverticular bleeding -H/H stable -GI evaluation ongoing-possible colonoscopy in am  Active Problems:  HYPERLIPIDEMIA -stable -c/w Fibrates and Statins   HYPERTENSION -currently controlled without use of anti-hypertensives -monitor   BILIARY CIRRHOSIS, PRIMARY -follows up with GI at [facility] -stable   Hyperkalemia -minimal -resolved   Dehydration -resolved with IVF   Leukocytosis -resolved -no signs of infection  Disposition: Remain inpatient  DVT Prophylaxis: Prophylactic Lovenox or Heparin  Code Status: SCD's  Procedures:  None  CONSULTS:  GI  PHYSICAL EXAM: Vital signs in last 24 hours: Filed Vitals:   03/06/12 2228 03/06/12 2230 03/07/12 0551 03/07/12 1314  BP: 142/82 150/114 124/73 137/75  Pulse: 99 101 77 77  Temp:   98.3 F (36.8 C) 98.5 F (36.9 C)  TempSrc:   Oral Oral  Resp:   18 20  Height:      Weight:      SpO2:   96% 92%    Weight change:  Body mass index is 33.39 kg/(m^2).   Gen Exam: Awake and alert with clear speech.   Neck: Supple, No JVD.   Chest: B/L Clear.   CVS: S1 S2 Regular, no murmurs.  Abdomen: soft, BS +, non tender, non distended.  Extremities: no edema, lower extremities warm to touch. Neurologic: Non Focal.   Skin: No Rash.   Wounds: N/A.   Intake/Output from previous day:  Intake/Output Summary (Last 24 hours) at 03/07/12 1619 Last data filed at 03/07/12 1300  Gross per 24 hour  Intake 1217.25 ml  Output    200 ml  Net 1017.25 ml     LAB RESULTS: CBC  Lab 03/07/12 0545 03/06/12 2357 03/06/12 1818 03/06/12 1316  WBC 7.8 8.8 11.5* 11.2*  HGB 12.9 12.9 14.3 15.2*  HCT 39.8 39.0 42.5 45.4   PLT 212 208 213 215  MCV 86.7 86.7 86.0 86.8  MCH 28.1 28.7 28.9 29.1  MCHC 32.4 33.1 33.6 33.5  RDW 14.3 14.3 14.0 14.2  LYMPHSABS -- -- -- --  MONOABS -- -- -- --  EOSABS -- -- -- --  BASOSABS -- -- -- --  BANDABS -- -- -- --    Chemistries   Lab 03/07/12 0545 03/06/12 1709 03/06/12 1316  NA 139 -- 138  K 4.1 4.4 5.2*  CL 106 -- 102  CO2 25 -- 28  GLUCOSE 94 -- 116*  BUN 22 -- 28*  CREATININE 0.95 -- 1.07  CALCIUM 8.9 -- 10.0  MG -- -- --    CBG: No results found for this basename: GLUCAP:5 in the last 168 hours  GFR Estimated Creatinine Clearance: 56.4 ml/min (by C-G formula based on Cr of 0.95).  Coagulation profile  Lab 03/06/12 1530  INR 1.00  PROTIME --    Cardiac Enzymes No results found for this basename: CK:3,CKMB:3,TROPONINI:3,MYOGLOBIN:3 in the last 168 hours  No components found with this basename: POCBNP:3 No results found for this basename: DDIMER:2 in the last 72 hours No results found for this basename: HGBA1C:2 in the last 72 hours No results found for this basename: CHOL:2,HDL:2,LDLCALC:2,TRIG:2,CHOLHDL:2,LDLDIRECT:2 in the last 72 hours No results found for this basename: TSH,T4TOTAL,FREET3,T3FREE,THYROIDAB in the last 72 hours No results found for this basename: VITAMINB12:2,FOLATE:2,FERRITIN:2,TIBC:2,IRON:2,RETICCTPCT:2 in the last 72 hours No  results found for this basename: LIPASE:2,AMYLASE:2 in the last 72 hours  Urine Studies No results found for this basename: UACOL:2,UAPR:2,USPG:2,UPH:2,UTP:2,UGL:2,UKET:2,UBIL:2,UHGB:2,UNIT:2,UROB:2,ULEU:2,UEPI:2,UWBC:2,URBC:2,UBAC:2,CAST:2,CRYS:2,UCOM:2,BILUA:2 in the last 72 hours  MICROBIOLOGY: No results found for this or any previous visit (from the past 240 hour(s)).  RADIOLOGY STUDIES/RESULTS: Dg Abd 2 Views  Mar 25, 2012  *RADIOLOGY REPORT*  Clinical Data: Rectal bleeding for 2 days.  ABDOMEN - 2 VIEW  Comparison: Abdominal pelvic CT 01/26/2010.  Findings: The bowel gas pattern is normal.   There is no free intraperitoneal air.  Scattered vascular calcifications are noted. There are degenerative changes throughout the lumbar spine.  IMPRESSION: No acute abdominal findings.   Original Report Authenticated By: Carey Bullocks, M.D.     MEDICATIONS: Scheduled Meds:   . fenofibrate  160 mg Oral QHS  . folic acid  0.5 mg Oral Daily  . metoCLOPramide (REGLAN) injection  10 mg Intravenous Once  . metoCLOPramide (REGLAN) injection  10 mg Intravenous Once  . omega-3 acid ethyl esters  2 g Oral Daily  . polyethylene glycol-electrolytes  2,000 mL Oral Once  . polyethylene glycol-electrolytes  2,000 mL Oral Once  . simvastatin  40 mg Oral QHS  . sodium chloride  3 mL Intravenous Q12H  . thiamine  100 mg Oral Daily  . vitamin B-12  1,000 mcg Oral Daily  . [DISCONTINUED] fish oil-omega-3 fatty acids  2 g Oral Daily  . [DISCONTINUED] folic acid  400 mcg Oral Daily   Continuous Infusions:   . sodium chloride 75 mL/hr at 03/07/12 1618   PRN Meds:.acetaminophen, acetaminophen, albuterol, ondansetron (ZOFRAN) IV, ondansetron  Antibiotics: Anti-infectives    None       Jeoffrey Massed, MD  [facility] Pager:[phone]  If 7PM-7AM, please contact night-coverage [email or web address] Password [email or web address] 03/07/2012, 4:19 PM   LOS: 1 day

## 2012-03-07 NOTE — Consult Note (Signed)
[facility] Consult: 10:52 AM 03/07/2012   Referring Provider: Waymon Amato Primary Care Physician:  Loreen Freud, DO Primary Gastroenterologist:  Dr. Claudette Head, Dr Ardine Eng at [facility]  Reason for Consultation:  Painless hematochezia.   HPI: Deanna [name] is a 78 y.o. female with history of HTN, hyperlipidemia, primary biliary cirrhosis (followed at [facility]), upper GI bleed in the 80s, AAA repair 2010.  Takes 81 mg ASA but no Plavix or Coumadin type meds.  Admitted 12/3 from ED with painless rectal bleeding. Started 4 days ago, about 4 to 5 per day.  No nausea, anorexia, dizziness, weakness, abdominal pain, rectal pain.  Today she describes mild, non-focal cramps.  Today the bleeding has slowed down.  She is not anemic.  Had colonoscopy in 2009 by Dr Russella Dar, I am unable to unearth the report.  Have faxed ROI request to [facility] for Colonoscopy and ? EGD done there within last 18 months.    She has no recall of the word diverticulosis and denies hx colon polps or colitis.   No known hx of varices or hemorrhoids     Past Medical History  Diagnosis Date  . HLD (hyperlipidemia)   . HTN (hypertension)   . AAA (abdominal aortic aneurysm) 2011    Dr Early repaired this  . Cirrhosis, biliary     liver bx 01/2008  . Fatigue   . Epistaxis   . OSA (obstructive sleep apnea)     mild on ss of 12/2008  . Leg pain     bilateral  . GERD (gastroesophageal reflux disease)   . Pneumonia   . Bleeding ulcer 1980s    H. Pylori    Past Surgical History  Procedure Date  . Iliac artery - femoral artery bypass graft 2011    Dr Arbie Cookey  . Cataract extraction   . Breast biopsy     Prior to Admission medications   Medication Sig Start Date End Date Taking? Authorizing Provider  amLODipine (NORVASC) 10 MG tablet Take 10 mg by mouth daily. 12/30/11  Yes Lelon Perla, DO  Ascorbic Acid (VITAMIN C) 500 MG tablet Take 500 mg by mouth daily.      Yes Historical Provider, MD  aspirin 81 MG tablet Take 81 mg by mouth daily.     Yes Historical Provider, MD  B Complex-C (SUPER B COMPLEX PO) Take 1 tablet by mouth daily.   Yes Historical Provider, MD  Cholecalciferol (VITAMIN D-3 PO) Take 1 tablet by mouth 2 (two) times daily.     Yes Historical Provider, MD  co-enzyme Q-10 30 MG capsule Take 30 mg by mouth daily.    Yes Historical Provider, MD  fenofibrate 160 MG tablet Take 160 mg by mouth at bedtime. 12/30/11  Yes Grayling Congress Lowne, DO  fish oil-omega-3 fatty acids 1000 MG capsule Take 2 g by mouth daily.     Yes Historical Provider, MD  Flaxseed, Linseed, (FLAXSEED OIL PO) Take 1 capsule by mouth daily.   Yes Historical Provider, MD  folic acid (FOLVITE) 400 MCG tablet Take 400 mcg by mouth daily.     Yes Historical Provider, MD  Nutritional Supplements (OSTEO ADVANCE) TABS Take 1 tablet by mouth daily.   Yes Historical Provider, MD  simvastatin (ZOCOR) 40 MG tablet Take 40 mg by mouth at bedtime. 12/30/11  Yes Grayling Congress Lowne, DO  Thiamine HCl (VITAMIN B-1) 100 MG tablet Take 100 mg by mouth daily.     Yes Historical Provider, MD  vitamin A 40981 UNIT capsule Take 10,000 Units by mouth daily.     Yes Historical Provider, MD  vitamin B-12 (CYANOCOBALAMIN) 1000 MCG tablet Take 1,000 mcg by mouth daily.     Yes Historical Provider, MD  vitamin E 400 UNIT capsule Take 400 Units by mouth daily.     Yes Historical Provider, MD    Scheduled Meds:    . [COMPLETED] sodium chloride   Intravenous Once  . fenofibrate  160 mg Oral QHS  . folic acid  0.5 mg Oral Daily  . omega-3 acid ethyl esters  2 g Oral Daily  . simvastatin  40 mg Oral QHS  . sodium chloride  3 mL Intravenous Q12H  . thiamine  100 mg Oral Daily  . vitamin B-12  1,000 mcg Oral Daily   Infusions:    . sodium chloride 75 mL/hr at 03/07/12 0552   PRN Meds: acetaminophen, acetaminophen, albuterol, ondansetron (ZOFRAN) IV, ondansetron   Allergies as of 03/06/2012  . (No  Known Allergies)    Family Hx: sister with Cirrhosis and ? Hep C Dtr with Hasimotos Thyroiditis, dilated CBD   History   Social History  . Marital Status: Widowed    Spouse Name: N/A    Number of Children: N/A  . Years of Education: N/A   Occupational History  . Not on file.   Social History Main Topics  . Smoking status: Former Smoker -- 52 years    Types: Cigarettes    Quit date: 02/01/2010  . Smokeless tobacco: Not on file  . Alcohol Use: No  . Drug Use: No  . Sexually Active: Not Currently   Other Topics Concern  . Not on file   Social History Narrative  . No narrative on file    REVIEW OF SYSTEMS: Constitutional:  No weakness ENT:  No nose bleeds.  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 Pulm:  No SOB or cough CV:  No palps or chest pain, no edema GU:  No dysuria, oliguria, hematuria GI:  No dysphagia or dyspepsia Heme:  Anemia at time of GIB in 1980s..    Transfusions:  Transfused in 1980s Neuro:  No headache, no balance problems or falls.  No confusion Derm:  No rash, no itching.  Endocrine:  No excessive thirst or urination.  No chills, sweats.  Immunization:  Flu shot up to date Travel:  None.    PHYSICAL EXAM: Vital signs in last 24 hours: Temp:  [97.3 F (36.3 C)-98.7 F (37.1 C)] 98.3 F (36.8 C) (12/04 0551) Pulse Rate:  [74-104] 77  (12/04 0551) Resp:  [16-20] 18  (12/04 0551) BP: (104-158)/(53-114) 124/73 mmHg (12/04 0551) SpO2:  [93 %-98 %] 96 % (12/04 0551) Weight:  [86.183 kg (190 lb)-96.707 kg (213 lb 3.2 oz)] 96.707 kg (213 lb 3.2 oz) (12/03 2107)  General: obese elderly WF.  Not frail, does not look 82 Head:  No swelling or signs of trauma  Eyes:  No icterus.  Ears:  Not HOH  Nose:  No  discharge Mouth:  Pink, moist, clear. Neck:  No mass, bruits, JVD Lungs:  Clear.  No cough or dyspnea Heart: RRR.  No MRG. Abdomen:  Soft, NT, ND.  Active BS.  Not distended.  Active BS.   Rectal:  scant blood on glove   Musc/Skeltl: no joint erythema or swelling Extremities:  No pedal edema  Neurologic:  Pleasant, no tremor, no asterixis.  Oriented x 3.  Walks to BR unassisted Skin:  No sores, no rash Tattoos:  none Nodes:  No cervical or groin adenopathy   Psych:  Pleasant, relaxed, cooperative.   Intake/Output from previous day: 12/03 0701 - 12/04 0700 In: 541.3 [I.V.:541.3] Out: 200 [Urine:200] Intake/Output this shift: Total I/O In: 200 [P.O.:200] Out: -   LAB RESULTS:  Basename 03/07/12 0545 03/06/12 2357 03/06/12 1818  WBC 7.8 8.8 11.5*  HGB 12.9 12.9 14.3  HCT 39.8 39.0 42.5  PLT 212 208 213   BMET Lab Results  Component Value Date   NA 139 03/07/2012   NA 138 03/06/2012   NA 139 12/30/2011   K 4.1 03/07/2012   K 4.4 03/06/2012   K 5.2* 03/06/2012   CL 106 03/07/2012   CL 102 03/06/2012   CL 104 12/30/2011   CO2 25 03/07/2012   CO2 28 03/06/2012   CO2 27 12/30/2011   GLUCOSE 94 03/07/2012   GLUCOSE 116* 03/06/2012   GLUCOSE 95 12/30/2011   BUN 22 03/07/2012   BUN 28* 03/06/2012   BUN 16 12/30/2011   CREATININE 0.95 03/07/2012   CREATININE 1.07 03/06/2012   CREATININE 1.1 12/30/2011   CALCIUM 8.9 03/07/2012   CALCIUM 10.0 03/06/2012   CALCIUM 9.3 12/30/2011   LFT  Basename 03/06/12 1316  PROT 7.7  ALBUMIN 3.7  AST 27  ALT 17  ALKPHOS 92  BILITOT 0.5  BILIDIR --  IBILI --   PT/INR Lab Results  Component Value Date   INR 1.00 03/06/2012   PT 13.1          RADIOLOGY STUDIES: Dg Abd 2 Views 03/06/2012  *RADIOLOGY REPORT*  Clinical Data: Rectal bleeding for 2 days.  ABDOMEN - 2 VIEW  Comparison: Abdominal pelvic CT 01/26/2010.  Findings: The bowel gas pattern is normal.  There is no free intraperitoneal air.  Scattered vascular calcifications are noted. There are degenerative changes throughout the lumbar spine.  IMPRESSION: No acute abdominal findings.   Original Report Authenticated By: Carey Bullocks, M.D.    ENDOSCOPIC STUDIES: 01/2008   Colonoscopy Unable to locate report.   2011 vs 2012 Colon/?EGD at [facility].  Waiting on records  IMPRESSION: *  Painless rectal bleeding without anemia or hemodynamic instability.  Suspect diverticular but need to get old colo reports *  PBC.   Coags, platelets, LFTs normal   *  Mild azotemia, resolved.  *  Hyperkalemia, resolved. *  2011 infrarenal AAA repair:  Aorto Bi common iliac bypass with hemashild graft.  Possibility of bleed from repair site seems unlikely   PLAN: *  Depending on colonoscopy reports from [facility], may not need to do any endoscopic eval. *  Leave on clears for now.  CBC in AM.   LOS: 1 day   Jennye Moccasin  03/07/2012, 10:52 AM Pager: [phone]      [facility] Attending  I have also seen and assessed the patient and agree with the above note. Small volume lower GI bleed is suspected - no melena at all Seems unlikely that would be related to AAA repair but will keep in mind. Will do colonoscopy and possible EGD -  Could need a CT but really do suspect diverticular bleed.   Colonoscopy at [facility] 2010 polyps and diverticulosis and poor prep  The risks and benefits as well as alternatives of endoscopic procedure(s) have been discussed and reviewed. All questions answered. The patient agrees to proceed.   Iva Boop, MD, [facility] [phone] (pager) 03/07/2012 4:19  PM   

## 2012-03-08 ENCOUNTER — Encounter (HOSPITAL_COMMUNITY)
Admission: EM | Disposition: A | Discharge status: Home / Self Care | Payer: Self-pay | Source: Home / Self Care | Attending: Internal Medicine

## 2012-03-08 ENCOUNTER — Inpatient Hospital Stay (HOSPITAL_COMMUNITY): Payer: Medicare Other

## 2012-03-08 ENCOUNTER — Encounter (HOSPITAL_COMMUNITY): Payer: Self-pay | Admitting: Internal Medicine

## 2012-03-08 DIAGNOSIS — E785 Hyperlipidemia, unspecified: Secondary | ICD-10-CM

## 2012-03-08 DIAGNOSIS — K55059 Acute (reversible) ischemia of intestine, part and extent unspecified: Principal | ICD-10-CM

## 2012-03-08 HISTORY — PX: COLONOSCOPY: SHX5424

## 2012-03-08 LAB — CBC
MCH: 28.8 pg (ref 26.0–34.0)
MCV: 85.6 fL (ref 78.0–100.0)
Platelets: 256 10*3/uL (ref 150–400)
RBC: 4.79 MIL/uL (ref 3.87–5.11)

## 2012-03-08 SURGERY — EGD (ESOPHAGOGASTRODUODENOSCOPY)
Anesthesia: Moderate Sedation

## 2012-03-08 SURGERY — COLONOSCOPY
Anesthesia: Moderate Sedation

## 2012-03-08 MED ORDER — FENTANYL CITRATE 0.05 MG/ML IJ SOLN
INTRAMUSCULAR | Status: DC | PRN
  Administered 2012-03-08 (×2): 25 ug via INTRAVENOUS

## 2012-03-08 MED ORDER — FENTANYL CITRATE 0.05 MG/ML IJ SOLN
INTRAMUSCULAR | Status: AC
  Filled 2012-03-08: qty 4

## 2012-03-08 MED ORDER — SODIUM CHLORIDE 0.9 % IV BOLUS (SEPSIS)
1000.0000 mL | Freq: Once | INTRAVENOUS | Status: AC
  Administered 2012-03-08: 1000 mL via INTRAVENOUS

## 2012-03-08 MED ORDER — MIDAZOLAM HCL 5 MG/ML IJ SOLN
INTRAMUSCULAR | Status: AC
  Filled 2012-03-08: qty 2

## 2012-03-08 MED ORDER — SODIUM CHLORIDE 0.9 % IV SOLN: INTRAVENOUS | Status: DC

## 2012-03-08 MED ORDER — MIDAZOLAM HCL 5 MG/5ML IJ SOLN
INTRAMUSCULAR | Status: DC | PRN
  Administered 2012-03-08 (×2): 2 mg via INTRAVENOUS

## 2012-03-08 MED ORDER — IOHEXOL 350 MG/ML SOLN
100.0000 mL | Freq: Once | INTRAVENOUS | Status: AC | PRN
  Administered 2012-03-08: 100 mL via INTRAVENOUS

## 2012-03-08 MED ORDER — ASPIRIN 81 MG PO TABS: 81.0000 mg | ORAL_TABLET | Freq: Every day | ORAL | Status: DC

## 2012-03-08 NOTE — Care Management Note (Signed)
    Page 1 of 1   03/08/2012     1:56:44 PM   CARE MANAGEMENT NOTE 03/08/2012  Patient:  [facility]   Account Number:  [id number]  Date Initiated:  03/08/2012  Documentation initiated by:  Letha Cape  Subjective/Objective Assessment:   dx  gib  admit- lives with daughter. pta indepedent.     Action/Plan:   Anticipated DC Date:  03/08/2012   Anticipated DC Plan:  HOME/SELF CARE      DC Planning Services  CM consult      Choice offered to / List presented to:             Status of service:  Completed, signed off Medicare Important Message given?   (If response is "NO", the following Medicare IM given date fields will be blank) Date Medicare IM given:   Date Additional Medicare IM given:    Discharge Disposition:  HOME/SELF CARE  Per UR Regulation:  Reviewed for med. necessity/level of care/duration of stay  If discussed at Long Length of Stay Meetings, dates discussed:    Comments:  03/08/12 13:55 Letha Cape RN, BSN [phone] patient lives with daughter, pta independent.  Patient has medication coverage through [employer] and she has transportation at discharge.  No needs anticipated at discharge.

## 2012-03-08 NOTE — Op Note (Signed)
[facility] [address] [place], [address]   COLONOSCOPY PROCEDURE REPORT  PATIENT: Deanna, [name]  MR#: [id number] BIRTHDATE: Jun 01, 1929 , 82  yrs. old GENDER: Female ENDOSCOPIST: Iva Boop, MD, [facility] PROCEDURE DATE:  03/08/2012 PROCEDURE:   Colonoscopy with biopsy ASA CLASS:   Class III INDICATIONS:Rectal Bleeding. MEDICATIONS: Fentanyl 50 mcg IV and Versed 4 mg IV  DESCRIPTION OF PROCEDURE:   After the risks benefits and alternatives of the procedure were thoroughly explained, informed consent was obtained.  A digital rectal exam revealed no abnormalities of the rectum.   The     endoscope was introduced through the anus and advanced to the cecum, which was identified by both the appendix and ileocecal valve. No adverse events experienced.   The quality of the prep was adequate, using Colyte The instrument was then slowly withdrawn as the colon was fully examined.      COLON FINDINGS: Abnormal mucosa was found in the descending colon and sigmoid colon. 35-50 cm. The mucosa was ulcerated, erythematous and had superficial ulcers.  This was likely consistent with ischemic colitis disease.  Multiple biopsies were performed using cold forceps.   Mild diverticulosis was noted in the sigmoid colon. The colon mucosa was otherwise normal.  Retroflexed views revealed no abnormalities. The time to cecum=7 minutes 0 seconds. Withdrawal time=9 minutes 0 seconds.  The scope was withdrawn and the procedure completed. COMPLICATIONS: There were no complications.  ENDOSCOPIC IMPRESSION: An area  of abnormal mucosa was found in the descending colon and sigmoid colon; multiple biopsies were performed using cold forceps - this looks like ischemic colitis Diverticulosis in sigmoid - mild Otherwise normal - adequate prep   RECOMMENDATIONS: CT angio today - discussed with Dr. Arbie Cookey   eSigned:  Iva Boop, MD, [facility] 03/08/2012 11:53 AM cc: The  Patient

## 2012-03-08 NOTE — Progress Notes (Signed)
PATIENT DETAILS Name: Deanna [name] Age: 78 y.o. Sex: female Date of Birth: 25-Sep-1929 Admit Date: 03/06/2012 Admitting Physician Elease Etienne, MD ZOX:WRUEAV Laury Axon, DO  Subjective: LGI  Bleed resolved-last Bloody BM was around 5 pm yesterday  Assessment/Plan: Principal Problem:  *Bright red rectal bleeding -Colonoscopy-suspicious for ischemic colitis-GI ordering a CT Angio -H/H stable -belly soft  Active Problems:  HYPERLIPIDEMIA -stable -c/w Fibrates and Statins   HYPERTENSION -currently controlled without use of anti-hypertensives -monitor and resume Amlodipine when able   BILIARY CIRRHOSIS, PRIMARY -follows up with GI at [facility] -stable   Hyperkalemia -minimal -resolved   Dehydration -resolved with IVF   Leukocytosis -resolved -no signs of infection  Disposition: Remain inpatient  DVT Prophylaxis: SCD's  Code Status: Full code  Procedures:  Colonoscopy 12/5-ischemic colitis  CONSULTS:  GI  PHYSICAL EXAM: Vital signs in last 24 hours: Filed Vitals:   03/08/12 1200 03/08/12 1210 03/08/12 1218 03/08/12 1220  BP: 130/64 131/61 131/61 131/72  Pulse:      Temp:      TempSrc:      Resp: 26 18 28 17   Height:      Weight:      SpO2: 96% 95% 92% 94%    Weight change:  Body mass index is 33.39 kg/(m^2).   Gen Exam: Awake and alert with clear speech.   Neck: Supple, No JVD.   Chest: B/L Clear.   CVS: S1 S2 Regular, no murmurs.  Abdomen: soft, BS +, non tender, non distended.  Extremities: no edema, lower extremities warm to touch. Neurologic: Non Focal.   Skin: No Rash.   Wounds: N/A.   Intake/Output from previous day:  Intake/Output Summary (Last 24 hours) at 03/08/12 1234 Last data filed at 03/08/12 0900  Gross per 24 hour  Intake    926 ml  Output      0 ml  Net    926 ml     LAB RESULTS: CBC  Lab 03/08/12 0945 03/08/12 0002 03/07/12 1723 03/07/12 0545 03/06/12 2357 03/06/12 1818 03/06/12 1316  WBC -- 8.4 -- 7.8 8.8 11.5*  11.2*  HGB 13.2 13.8 13.7 12.9 12.9 -- --  HCT 39.7 41.0 41.5 39.8 39.0 -- --  PLT -- 256 -- 212 208 213 215  MCV -- 85.6 -- 86.7 86.7 86.0 86.8  MCH -- 28.8 -- 28.1 28.7 28.9 29.1  MCHC -- 33.7 -- 32.4 33.1 33.6 33.5  RDW -- 14.1 -- 14.3 14.3 14.0 14.2  LYMPHSABS -- -- -- -- -- -- --  MONOABS -- -- -- -- -- -- --  EOSABS -- -- -- -- -- -- --  BASOSABS -- -- -- -- -- -- --  BANDABS -- -- -- -- -- -- --    Chemistries   Lab 03/07/12 0545 03/06/12 1709 03/06/12 1316  NA 139 -- 138  K 4.1 4.4 5.2*  CL 106 -- 102  CO2 25 -- 28  GLUCOSE 94 -- 116*  BUN 22 -- 28*  CREATININE 0.95 -- 1.07  CALCIUM 8.9 -- 10.0  MG -- -- --    CBG: No results found for this basename: GLUCAP:5 in the last 168 hours  GFR Estimated Creatinine Clearance: 56.4 ml/min (by C-G formula based on Cr of 0.95).  Coagulation profile  Lab 03/06/12 1530  INR 1.00  PROTIME --    Cardiac Enzymes No results found for this basename: CK:3,CKMB:3,TROPONINI:3,MYOGLOBIN:3 in the last 168 hours  No components found with this basename: POCBNP:3 No results found for  this basename: DDIMER:2 in the last 72 hours No results found for this basename: HGBA1C:2 in the last 72 hours No results found for this basename: CHOL:2,HDL:2,LDLCALC:2,TRIG:2,CHOLHDL:2,LDLDIRECT:2 in the last 72 hours No results found for this basename: TSH,T4TOTAL,FREET3,T3FREE,THYROIDAB in the last 72 hours No results found for this basename: VITAMINB12:2,FOLATE:2,FERRITIN:2,TIBC:2,IRON:2,RETICCTPCT:2 in the last 72 hours No results found for this basename: LIPASE:2,AMYLASE:2 in the last 72 hours  Urine Studies No results found for this basename: UACOL:2,UAPR:2,USPG:2,UPH:2,UTP:2,UGL:2,UKET:2,UBIL:2,UHGB:2,UNIT:2,UROB:2,ULEU:2,UEPI:2,UWBC:2,URBC:2,UBAC:2,CAST:2,CRYS:2,UCOM:2,BILUA:2 in the last 72 hours  MICROBIOLOGY: No results found for this or any previous visit (from the past 240 hour(s)).  RADIOLOGY STUDIES/RESULTS: Dg Abd 2  Views  03/06/2012  *RADIOLOGY REPORT*  Clinical Data: Rectal bleeding for 2 days.  ABDOMEN - 2 VIEW  Comparison: Abdominal pelvic CT 01/26/2010.  Findings: The bowel gas pattern is normal.  There is no free intraperitoneal air.  Scattered vascular calcifications are noted. There are degenerative changes throughout the lumbar spine.  IMPRESSION: No acute abdominal findings.   Original Report Authenticated By: Carey Bullocks, M.D.     MEDICATIONS: Scheduled Meds:    . fenofibrate  160 mg Oral QHS  . folic acid  0.5 mg Oral Daily  . [COMPLETED] metoCLOPramide (REGLAN) injection  10 mg Intravenous Once  . [COMPLETED] metoCLOPramide (REGLAN) injection  10 mg Intravenous Once  . omega-3 acid ethyl esters  2 g Oral Daily  . [COMPLETED] polyethylene glycol-electrolytes  2,000 mL Oral Once  . [COMPLETED] polyethylene glycol-electrolytes  2,000 mL Oral Once  . simvastatin  40 mg Oral QHS  . sodium chloride  3 mL Intravenous Q12H  . thiamine  100 mg Oral Daily  . vitamin B-12  1,000 mcg Oral Daily   Continuous Infusions:    . sodium chloride 75 mL/hr at 03/07/12 1618  . sodium chloride 20 mL/hr (03/07/12 1750)   PRN Meds:.acetaminophen, acetaminophen, albuterol, ondansetron (ZOFRAN) IV, ondansetron, [DISCONTINUED] fentaNYL, [DISCONTINUED] midazolam  Antibiotics: Anti-infectives    None       Jeoffrey Massed, MD  [facility] Pager:[phone]  If 7PM-7AM, please contact night-coverage [email or web address] Password [email or web address] 03/08/2012, 12:34 PM   LOS: 2 days

## 2012-03-08 NOTE — Discharge Summary (Signed)
PATIENT DETAILS Name: Deanna [name] Age: 78 y.o. Sex: female Date of Birth: 10-30-1929 MRN: [id number]. Admit Date: 03/06/2012 Admitting Physician: Elease Etienne, MD HQI:[id number] Laury Axon, DO  Recommendations for Outpatient Follow-up:  Please follow up on the colonoscopy biopsy results  PRIMARY DISCHARGE DIAGNOSIS:  Principal Problem:  *Acute ischemic colitis Active Problems:  HYPERLIPIDEMIA  HYPERTENSION  GERD  BILIARY CIRRHOSIS, PRIMARY  Hyperkalemia  Dehydration  Leukocytosis      PAST MEDICAL HISTORY: Past Medical History  Diagnosis Date  . HLD (hyperlipidemia)   . HTN (hypertension)   . AAA (abdominal aortic aneurysm) 2011    Dr Early repaired this  . Cirrhosis, biliary     liver bx 01/2008  . Fatigue   . Epistaxis   . OSA (obstructive sleep apnea)     mild on ss of 12/2008  . Leg pain     bilateral  . GERD (gastroesophageal reflux disease)   . Pneumonia   . Bleeding ulcer 1980s    H. Pylori  . Acute ischemic colitis 03/06/2012    DISCHARGE MEDICATIONS:   Medication List     As of 03/08/2012  5:42 PM    TAKE these medications         amLODipine 10 MG tablet   Commonly known as: NORVASC   Take 10 mg by mouth daily.      aspirin 81 MG tablet   Take 1 tablet (81 mg total) by mouth daily. Take from 03/10/12      co-enzyme Q-10 30 MG capsule   Take 30 mg by mouth daily.      fenofibrate 160 MG tablet   Take 160 mg by mouth at bedtime.      fish oil-omega-3 fatty acids 1000 MG capsule   Take 2 g by mouth daily.      FLAXSEED OIL PO   Take 1 capsule by mouth daily.      folic acid 400 MCG tablet   Commonly known as: FOLVITE   Take 400 mcg by mouth daily.      OSTEO ADVANCE Tabs   Take 1 tablet by mouth daily.      simvastatin 40 MG tablet   Commonly known as: ZOCOR   Take 40 mg by mouth at bedtime.      SUPER B COMPLEX PO   Take 1 tablet by mouth daily.      thiamine 100 MG tablet   Commonly known as: VITAMIN B-1   Take 100 mg by mouth  daily.      vitamin A 28413 UNIT capsule   Take 10,000 Units by mouth daily.      vitamin B-12 1000 MCG tablet   Commonly known as: CYANOCOBALAMIN   Take 1,000 mcg by mouth daily.      vitamin C 500 MG tablet   Commonly known as: ASCORBIC ACID   Take 500 mg by mouth daily.      VITAMIN D-3 PO   Take 1 tablet by mouth 2 (two) times daily.      vitamin E 400 UNIT capsule   Take 400 Units by mouth daily.         BRIEF HPI:  See H&P, Labs, Consult and Test reports for all details in brief, patient was admitted for rectal bleeding.  CONSULTATIONS:   GI  PERTINENT RADIOLOGIC STUDIES: Dg Abd 2 Views  03/06/2012  *RADIOLOGY REPORT*  Clinical Data: Rectal bleeding for 2 days.  ABDOMEN - 2 VIEW  Comparison: Abdominal pelvic CT  01/26/2010.  Findings: The bowel gas pattern is normal.  There is no free intraperitoneal air.  Scattered vascular calcifications are noted. There are degenerative changes throughout the lumbar spine.  IMPRESSION: No acute abdominal findings.   Original Report Authenticated By: Carey Bullocks, M.D.    Ct Angio Abd/pel W/ And/or W/o  03/08/2012  *RADIOLOGY REPORT*  Clinical Data:  Ischemic colitis after AAA repair and bypass  CT ANGIOGRAPHY ABDOMEN AND PELVIS  Technique:  Multidetector CT imaging of the abdomen and pelvis was performed using the standard protocol during bolus administration of intravenous contrast.  Multiplanar reconstructed images including MIPs were obtained and reviewed to evaluate the vascular anatomy.  Contrast: OMNIPAQUE IOHEXOL 350 MG/ML SOLN  Comparison:  CT abdomen pelvis - 01/26/2010; 07/21/2006; MRCP - 01/07/2008  Vascular Findings:  Abdominal aorta:  Post open surgical repair of previously noted irregular infrarenal abdominal aortic aneurysm. The surgical bypass graft is widely patent without evidence of contrast extravasation, vessel dissection or periaortic stranding.  There is mild persistent mild aneurysmal dilatation of the  superior aspect of the native infrarenal abdominal aorta measuring approximately 3.7 x 3.8 cm in greatest oblique axial dimension (image 68, series 2) with a minimal amount of crescentic mural thrombus at this location.  Celiac artery:  There is moderate length narrowing and focal angulation of the origin and proximal aspect of the celiac artery which were results in likely 70% luminal narrowing (sagittal images 69 and 70, series 401).  Classical branching pattern.  SMA: The origin of the SMA is widely patent.  There is a minimal amount of eccentric noncalcified plaque involving the proximal aspect of the main trunk of the SMA (sagittal image 67, series 41), not resulting in hemodynamically significant narrowing. Conventional branching pattern.  Right renal artery:  Solitary; there is a minimal amount of eccentric noncalcified plaque involving the origin of the right renal artery, not resulting in hemodynamically significant stenosis.  Left renal artery:  Solitary; residual crescentic mural thrombus within the cranial aspect of the root remaining native infrarenal abdominal aorta abuts the origin of the left renal artery however does not result in hemodynamically significant narrowing (sagittal image 74 and 75, series 401).  IMA:  The IMA is expected to be occluded at its origin, however there is early reconstitution via hypertrophied mesenteric collaterals (image 108, series 2).  Pelvic vasculature:  The distal ends of the surgical bypass graft are widely patent, though the right-sided anastomosis is noted to be mildly tortuous.  There is extensive atherosclerotic plaque within the bilateral external iliac arteries, not resulting in hemodynamically significant stenosis.  Review of the MIP images confirms the above findings.  ------------------------------------------------------------------- -------  Nonvascular findings:  Normal hepatic contour. Unchanged approximately 1.7 cm hypoattenuating (10 HU) cyst within  the left lobe of the liver. There is a minimal amount of high attenuating debris layering with an otherwise normal-appearing gallbladder.  The gallbladder is distended but otherwise normal.  The gallbladder wall thickening or pericholecystic fluid.  No definite intra or extrahepatic biliary ductal dilatation.  No ascites.  There is symmetric enhancement and excretion of the bilateral kidneys.  Bilateral renal cysts are unchanged with index right- sided exophytic approximately 6.1 cm in index left-sided exophytic approximately 3.4 cm renal cyst.  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  No urinary obstruction or perinephric stranding.  Normal appearance of the bilateral adrenal glands,  pancreas and spleen.  Incidental note is made of a small splenule.  The sigmoid colon is noted to be redundant.  Gaseous distension of the rectum.  The bowel is otherwise normal in course and caliber without wall thickening or evidence of obstruction.  No pneumoperitoneum, pneumatosis or portal venous gas.  Normal appearance of the pelvic organs.  No discrete adnexal lesion.  No free fluid in the pelvis.  No retroperitoneal, mesenteric, pelvic or inguinal lymphadenopathy.  Limited visualization of the lower thorax demonstrates right greater than left dependent atelectasis.  No focal airspace opacities or pleural effusion.  Cardiomegaly.  No pericardial effusion.  No acute or aggressive osseous abnormalities.  Multilevel lumbar spine degenerative change.  Stable mild (approximate 25%) compression deformity of the superior endplate of the L4 vertebral body.  Moderate multilevel bilateral facet degenerative change, worst at L4 - L5.  Incidental note is made of several adjacent wide neck mesenteric fat containing midline anterior abdominal wall incisional hernias.  IMPRESSION: 1.  Post open surgical repair of  previously noted infrarenal abdominal aortic aneurysm without evidence of complication.  2.  Persistent mild aneurysmal dilatation of the cranial most aspect of the residual native abdominal aorta measuring approximately 3.8 cm in diameter with minimal amount of residual crescentic mural thrombus at this location  3.  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  The SMA is widely patent.  The IMA is expected be occluded at its origin, however there is early reconstitution via hypertrophied mesenteric collaterals.  4. Cholelithiasis.  5. Hepatic and bilateral renal cysts.   Original Report Authenticated By: Tacey Ruiz, MD      PERTINENT LAB RESULTS: CBC:  Basename 03/08/12 0945 03/08/12 0002 03/07/12 0545  WBC -- 8.4 7.8  HGB 13.2 13.8 --  HCT 39.7 41.0 --  PLT -- 256 212   CMET CMP     Component Value Date/Time   NA 139 03/07/2012 0545   K 4.1 03/07/2012 0545   CL 106 03/07/2012 0545   CO2 25 03/07/2012 0545   GLUCOSE 94 03/07/2012 0545   BUN 22 03/07/2012 0545   CREATININE 0.95 03/07/2012 0545   CALCIUM 8.9 03/07/2012 0545   PROT 7.7 03/06/2012 1316   ALBUMIN 3.7 03/06/2012 1316   AST 27 03/06/2012 1316   ALT 17 03/06/2012 1316   ALKPHOS 92 03/06/2012 1316   BILITOT 0.5 03/06/2012 1316   GFRNONAA 55* 03/07/2012 0545   GFRAA 64* 03/07/2012 0545    GFR Estimated Creatinine Clearance: 56.4 ml/min (by C-G formula based on Cr of 0.95). No results found for this basename: LIPASE:2,AMYLASE:2 in the last 72 hours No results found for this basename: CKTOTAL:3,CKMB:3,CKMBINDEX:3,TROPONINI:3 in the last 72 hours No components found with this basename: POCBNP:3 No results found for this basename: DDIMER:2 in the last 72 hours No results found for this basename: HGBA1C:2 in the last 72 hours No results found for this basename: CHOL:2,HDL:2,LDLCALC:2,TRIG:2,CHOLHDL:2,LDLDIRECT:2 in the last 72 hours No results found for this basename:  TSH,T4TOTAL,FREET3,T3FREE,THYROIDAB in the last 72 hours No results found for this basename: VITAMINB12:2,FOLATE:2,FERRITIN:2,TIBC:2,IRON:2,RETICCTPCT:2 in the last 72 hours Coags:  Basename 03/06/12 1530  INR 1.00   Microbiology: No results found for this or any previous visit (from the past 240 hour(s)).   BRIEF HOSPITAL COURSE:   Principal Problem:  *Acute ischemic colitis -patient was admitted with rectal bleeding, she never had abdominal pain. Hb was stable throughout. She did not require PRBC transfusion. She was admitted, GI  was consulted, Colonoscopy was done 03/08/12-which showed ischemic colitis. Since she had a open infra renal aneurysm repair with aorto-bifemoral bypass-a CT Angiogram was done, which showed patient SMA, IMA was expected to be blocked from the above procedure, but had good collaterals. 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 Spoke with Dr Leone Payor who has cleared the patient for discharge as well.  HYPERLIPIDEMIA  -stable  -c/w Fibrates and Statins   HYPERTENSION -resume Amlodipine  BILIARY CIRRHOSIS, PRIMARY  -follows up with GI at [facility]  -stable  Hyperkalemia  -minimal  -resolved   Dehydration  -resolved with IVF   Leukocytosis  -resolved  -no signs of infection, did not require any antibiotics  TODAY-DAY OF DISCHARGE:  Subjective:   [place] today has no headache,no chest abdominal pain,no new weakness tingling or numbness, feels much better wants to go home today. Rectal bleeding has resolved for more than 24 hours.  Objective:   Blood pressure 155/89, pulse 74, temperature 97.1 F (36.2 C), temperature source Oral, resp. rate 20, height 5\' 7"  (1.702 m), weight 96.707 kg (213 lb 3.2 oz), SpO2 94.00%.  Intake/Output Summary (Last 24 hours) at 03/08/12 1742 Last data filed at  03/08/12 0900  Gross per 24 hour  Intake    450 ml  Output      0 ml  Net    450 ml    Exam Awake Alert, Oriented *3, No new F.N deficits, Normal affect [employer].AT,PERRAL Supple Neck,No JVD, No cervical lymphadenopathy appriciated.  Symmetrical Chest wall movement, Good air movement bilaterally, CTAB RRR,No Gallops,Rubs or new Murmurs, No Parasternal Heave +ve B.Sounds, Abd Soft, Non tender, No organomegaly appriciated, No rebound -guarding or rigidity. No Cyanosis, Clubbing or edema, No new Rash or bruise  DISCHARGE CONDITION: Stable  DISPOSITION: HOME  DISCHARGE INSTRUCTIONS:    Activity:  As tolerated   Diet recommendation: Heart Healthy diet       Follow-up Information    Follow up with Loreen Freud, DO. Schedule an appointment as soon as possible for a visit in 2 weeks.   Contact information:   [address] [facility] [address] [place] [address] [phone]       Follow up with Venita Lick. Russella Dar, MD,FACG. Schedule an appointment as soon as possible for a visit in 1 week.   Contact information:   [address] [place] [address] [phone]       Follow up with EARLY, TODD, MD. Schedule an appointment as soon as possible for a visit in 2 weeks.   Contact information:   [address] [place] [address] [phone]         Total Time spent on discharge equals 45 minutes.  SignedJeoffrey Massed 03/08/2012 5:42 PM

## 2012-03-08 NOTE — Progress Notes (Signed)
Pt. discharged to floor,verbalized understanding of discharged instruction,medication,restriction,diet and follow up appointment.Baseline Vitals sign stable,Pt comfortable,no sign and symptom of distress. 

## 2012-03-09 ENCOUNTER — Encounter (HOSPITAL_COMMUNITY): Payer: Self-pay | Admitting: Internal Medicine

## 2012-03-09 NOTE — ED Provider Notes (Signed)
Medical screening examination/treatment/procedure(s) were conducted as a shared visit with non-physician practitioner(s) and myself.  I personally evaluated the patient during the encounter  Pt seen/examined, stable in the ED, would benefit from observation in the ED for GI bleed   Joya Gaskins, MD 03/09/12 [phone]

## 2012-04-12 ENCOUNTER — Ambulatory Visit (INDEPENDENT_AMBULATORY_CARE_PROVIDER_SITE_OTHER): Payer: Medicare Other | Admitting: Family Medicine

## 2012-04-12 ENCOUNTER — Encounter: Payer: Self-pay | Admitting: Family Medicine

## 2012-04-12 VITALS — BP 124/76 | HR 92 | Temp 98.2°F | Wt 186.4 lb

## 2012-04-12 DIAGNOSIS — J40 Bronchitis, not specified as acute or chronic: Secondary | ICD-10-CM

## 2012-04-12 MED ORDER — GUAIFENESIN-CODEINE 100-10 MG/5ML PO SYRP: 5.0000 mL | ORAL_SOLUTION | Freq: Three times a day (TID) | ORAL | Status: DC | PRN

## 2012-04-12 MED ORDER — AMOXICILLIN-POT CLAVULANATE 875-125 MG PO TABS: 1.0000 | ORAL_TABLET | Freq: Two times a day (BID) | ORAL | Status: DC

## 2012-04-12 NOTE — Progress Notes (Signed)
  Subjective:     Deanna [name] is a 79 y.o. female here for evaluation of a cough. Onset of symptoms was 2 weeks ago. Symptoms have been gradually worsening since that time. The cough is productive and is aggravated by exercise, infection and reclining position. Associated symptoms include: chills, shortness of breath and sputum production. Patient does not have a history of asthma. Patient does not have a history of environmental allergens. Patient has traveled recently. Patient does not have a history of smoking. Patient has not had a previous chest x-ray. Patient has not had a PPD done.  The following portions of the patient's history were reviewed and updated as appropriate: allergies, current medications, past family history, past medical history, past social history, past surgical history and problem list.  Review of Systems Pertinent items are noted in HPI.    Objective:    Oxygen saturation 98% on room air BP 124/76  Pulse 92  Temp 98.2 F (36.8 C) (Oral)  Wt 186 lb 6.4 oz (84.55 kg)  SpO2 98% General appearance: alert, cooperative, appears stated age and no distress Ears: + fluid Nose: green discharge, moderate congestion, turbinates red, swollen, sinus tenderness bilateral Throat: abnormal findings: marked oropharyngeal erythema and mild oropharyngeal erythema Neck: moderate anterior cervical adenopathy, supple, symmetrical, trachea midline and thyroid not enlarged, symmetric, no tenderness/mass/nodules Lungs: diminished breath sounds bilaterally Heart: S1, S2 normal Extremities: extremities normal, atraumatic, no cyanosis or edema    Assessment:    Acute Bronchitis    Plan:    Antibiotics per medication orders. Antitussives per medication orders. Avoid exposure to tobacco smoke and fumes. Call if shortness of breath worsens, blood in sputum, change in character of cough, development of fever or chills, inability to maintain nutrition and hydration. Avoid exposure to  tobacco smoke and fumes.  F/u prn

## 2012-04-12 NOTE — Patient Instructions (Signed)

## 2012-04-30 ENCOUNTER — Encounter: Payer: Self-pay | Admitting: Vascular Surgery

## 2012-05-01 ENCOUNTER — Encounter: Payer: Self-pay | Admitting: Vascular Surgery

## 2012-05-01 ENCOUNTER — Ambulatory Visit (INDEPENDENT_AMBULATORY_CARE_PROVIDER_SITE_OTHER): Payer: Medicare Other | Admitting: Vascular Surgery

## 2012-05-01 ENCOUNTER — Encounter: Payer: TRICARE For Life (TFL) | Admitting: Vascular Surgery

## 2012-05-01 VITALS — BP 127/72 | HR 78 | Ht 67.0 in | Wt 202.0 lb

## 2012-05-01 DIAGNOSIS — I774 Celiac artery compression syndrome: Secondary | ICD-10-CM

## 2012-05-01 NOTE — Progress Notes (Signed)
Patient has today for evaluation of potential mesenteric ischemia. She is well known to me from a prior open abdominal aortic aneurysm repair in October of 2011. She did well following her surgery. He recently had an episode of painless blood per rectum. She was admitted and underwent evaluation to include colonoscopy which apparently revealed ischemic colitis changes. Her she had a normal white blood cell count that time and was mildly anemic. She continues to have no pain his had no further bleeding. She specifically has no postprandial abdominal pain.  Past Medical History  Diagnosis Date  . HLD (hyperlipidemia)   . HTN (hypertension)   . AAA (abdominal aortic aneurysm) 2011    Dr Early repaired this  . Cirrhosis, biliary     liver bx 01/2008  . Fatigue   . Epistaxis   . OSA (obstructive sleep apnea)     mild on ss of 12/2008  . Leg pain     bilateral  . GERD (gastroesophageal reflux disease)   . Pneumonia   . Bleeding ulcer 1980s    H. Pylori  . Acute ischemic colitis 03/06/2012    History  Substance Use Topics  . Smoking status: Former Smoker -- 52 years    Types: Cigarettes    Quit date: 02/01/2010  . Smokeless tobacco: Not on file  . Alcohol Use: No    History reviewed. No pertinent family history.  No Known Allergies  Current outpatient prescriptions:amLODipine (NORVASC) 10 MG tablet, Take 10 mg by mouth daily., Disp: , Rfl: ;  Ascorbic Acid (VITAMIN C) 500 MG tablet, Take 500 mg by mouth daily.  , Disp: , Rfl: ;  aspirin 81 MG tablet, Take 1 tablet (81 mg total) by mouth daily. Take from 03/10/12, Disp: 30 tablet, Rfl: ;  Cholecalciferol (VITAMIN D-3 PO), Take 1 tablet by mouth 2 (two) times daily.  , Disp: , Rfl:  co-enzyme Q-10 30 MG capsule, Take 30 mg by mouth daily. , Disp: , Rfl: ;  fenofibrate 160 MG tablet, Take 160 mg by mouth at bedtime., Disp: , Rfl: ;  fish oil-omega-3 fatty acids 1000 MG capsule, Take 2 g by mouth daily.  , Disp: , Rfl: ;  Flaxseed, Linseed,  (FLAXSEED OIL PO), Take 1 capsule by mouth daily., Disp: , Rfl: ;  folic acid (FOLVITE) 400 MCG tablet, Take 400 mcg by mouth daily.  , Disp: , Rfl:  guaiFENesin-codeine (ROBITUSSIN AC) 100-10 MG/5ML syrup, Take 5 mLs by mouth 3 (three) times daily as needed for cough., Disp: 120 mL, Rfl: 0;  Nutritional Supplements (OSTEO ADVANCE) TABS, Take 1 tablet by mouth daily., Disp: , Rfl: ;  simvastatin (ZOCOR) 40 MG tablet, Take 40 mg by mouth at bedtime., Disp: , Rfl: ;  Thiamine HCl (VITAMIN B-1) 100 MG tablet, Take 100 mg by mouth daily.  , Disp: , Rfl:  vitamin A 16109 UNIT capsule, Take 10,000 Units by mouth daily.  , Disp: , Rfl: ;  vitamin B-12 (CYANOCOBALAMIN) 1000 MCG tablet, Take 1,000 mcg by mouth daily.  , Disp: , Rfl: ;  amoxicillin-clavulanate (AUGMENTIN) 875-125 MG per tablet, Take 1 tablet by mouth 2 (two) times daily., Disp: 20 tablet, Rfl: 0;  B Complex-C (SUPER B COMPLEX PO), Take 1 tablet by mouth daily., Disp: , Rfl:  vitamin E 400 UNIT capsule, Take 400 Units by mouth daily.  , Disp: , Rfl:   BP 127/72  Pulse 78  Ht 5\' 7"  (1.702 m)  Wt 202 lb (91.627 kg)  BMI 31.64 kg/m2  SpO2 95%  Body mass index is 31.64 kg/(m^2).       Physical exam well-developed moderately obese white female no acute distress. Her radial femoral and popliteal pulses are 2+ without evidence of peripheral artery aneurysm. Her midline abdominal incision is well healed with no hernias and she has no abdominal tenderness. She does not have abdominal bruits.  I did review her CT scan from 03/08/2012. This shows a widely patent superior mesenteric artery. 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 The radiologist's interpretation was of a 70% stenosis in the field this is in all likelihood appropriate. She does have early collateralization of her ligated intermesenteric artery.  Impression and plan: I had a long discussion with the patient and her granddaughter  present. I explained that with a widely patent spare mesenteric artery and good collateralization into the inferior mesenteric artery be unlikely that the celiac compression would have caused: Ischemia. She has not had pain or recurrent episodes I would recommend observation only. I did explain that the correction of her celiac compression would require open surgery and mobilization of this. I explained this should be very unlikely to be necessary. She was reassured this discussion will see [place] again on an as-needed basis should she develop recurrent symptoms.  The patient does need to have dental implants and we will let Dr. Manson Passey noted there are no contraindications from the vascular surgery standpoint

## 2012-05-22 ENCOUNTER — Encounter: Payer: TRICARE For Life (TFL) | Admitting: Vascular Surgery

## 2012-05-23 ENCOUNTER — Telehealth: Payer: Self-pay

## 2012-05-23 NOTE — Telephone Encounter (Signed)
Ideally then she would need to text crestor 10 mg   #1/2 tab po qhs ,  #30   2 refills Recheck labs 6 mothes----272.4  250.00   Bmp, hgbba1c, microalb

## 2012-05-23 NOTE — Telephone Encounter (Signed)
Letter from [employer] advising the Amlodipine and the Simvastatin can increase the simvastatin plasma levels and a risk for myopathy. Per Dr. Laury Axon change the Zocor to Lipitor 20 # 30 1 po qhs with 2 refills. I discussed with the patient and she is unable to take the Lipitor, she can not remember why but she has some type of reaction to it when she was up [place]. Please advise     KP

## 2012-05-25 MED ORDER — ROSUVASTATIN CALCIUM 10 MG PO TABS: 5.0000 mg | ORAL_TABLET | Freq: Every day | ORAL | Status: DC

## 2012-05-25 NOTE — Telephone Encounter (Signed)
Detailed message left advising Rx sent to the pharmacy.      KP 

## 2012-07-16 ENCOUNTER — Encounter: Payer: Self-pay | Admitting: Internal Medicine

## 2012-07-16 ENCOUNTER — Other Ambulatory Visit (INDEPENDENT_AMBULATORY_CARE_PROVIDER_SITE_OTHER): Payer: Medicare Other

## 2012-07-16 ENCOUNTER — Ambulatory Visit (INDEPENDENT_AMBULATORY_CARE_PROVIDER_SITE_OTHER): Payer: Medicare Other | Admitting: Internal Medicine

## 2012-07-16 VITALS — BP 128/82 | HR 84 | Temp 98.0°F

## 2012-07-16 DIAGNOSIS — E785 Hyperlipidemia, unspecified: Secondary | ICD-10-CM

## 2012-07-16 DIAGNOSIS — IMO0001 Reserved for inherently not codable concepts without codable children: Secondary | ICD-10-CM

## 2012-07-16 LAB — CBC
Hemoglobin: 14 g/dL (ref 12.0–15.0)
Platelets: 232 10*3/uL (ref 150.0–400.0)
RDW: 15.2 % — ABNORMAL HIGH (ref 11.5–14.6)
WBC: 6.4 10*3/uL (ref 4.5–10.5)

## 2012-07-16 LAB — LIPID PANEL
HDL: 41.1 mg/dL (ref >39.00)
LDL Cholesterol: 62 mg/dL (ref 0–99)
Total CHOL/HDL Ratio: 3

## 2012-07-16 LAB — HEPATIC FUNCTION PANEL
ALT: 19 U/L (ref 0–35)
Alkaline Phosphatase: 86 U/L (ref 39–117)
Bilirubin, Direct: 0.1 mg/dL (ref 0.0–0.3)
Total Bilirubin: 0.5 mg/dL (ref 0.3–1.2)
Total Protein: 7.3 g/dL (ref 6.0–8.3)

## 2012-07-16 NOTE — Patient Instructions (Signed)
Myalgia, Adult Myalgia is the medical term for muscle pain. It is a symptom of many things. Nearly everyone at some time in their life has this. The most common cause for muscle pain is overuse or straining and more so when you are not in shape. Injuries and muscle bruises cause myalgias. Muscle pain without a history of injury can also be caused by a virus. It frequently comes along with the flu. Myalgia not caused by muscle strain can be present in a large number of infectious diseases. Some autoimmune diseases like lupus and fibromyalgia can cause muscle pain. Myalgia may be mild, or severe. SYMPTOMS  The symptoms of myalgia are simply muscle pain. Most of the time this is short lived and the pain goes away without treatment. DIAGNOSIS  Myalgia is diagnosed by your caregiver by taking your history. This means you tell him when the problems began, what they are, and what has been happening. If this has not been a long term problem, your caregiver may want to watch for a while to see what will happen. If it has been long term, they may want to do additional testing. TREATMENT  The treatment depends on what the underlying cause of the muscle pain is. Often anti-inflammatory medications will help. HOME CARE INSTRUCTIONS  If the pain in your muscles came from overuse, slow down your activities until the problems go away.  Myalgia from overuse of a muscle can be treated with alternating hot and cold packs on the muscle affected or with cold for the first couple days. If either heat or cold seems to make things worse, stop their use.  Apply ice to the sore area for 15 to 20 minutes, 3 to 4 times per day, while awake for the first 2 days of muscle soreness, or as directed. Put the ice in a plastic bag and place a towel between the bag of ice and your skin.  Only take over-the-counter or prescription medicines for pain, discomfort, or fever as directed by your caregiver.  Regular gentle exercise may help  if you are not active.  Stretching before strenuous exercise can help lower the risk of myalgia. It is normal when beginning an exercise regimen to feel some muscle pain after exercising. Muscles that have not been used frequently will be sore at first. If the pain is extreme, this may mean injury to a muscle. SEEK MEDICAL CARE IF:  You have an increase in muscle pain that is not relieved with medication.  You begin to run a temperature.  You develop nausea and vomiting.  You develop a stiff and painful neck.  You develop a rash.  You develop muscle pain after a tick bite.  You have continued muscle pain while working out even after you are in good condition. SEEK IMMEDIATE MEDICAL CARE IF: Any of your problems are getting worse and medications are not helping. MAKE SURE YOU:   Understand these instructions.  Will watch your condition.  Will get help right away if you are not doing well or get worse. Document Released: 02/10/2006 Document Revised: 06/13/2011 Document Reviewed: 05/02/2006 ExitCare Patient Information 2013 [employer].  

## 2012-07-17 ENCOUNTER — Other Ambulatory Visit: Payer: Self-pay | Admitting: Internal Medicine

## 2012-07-17 ENCOUNTER — Encounter: Payer: Self-pay | Admitting: Internal Medicine

## 2012-07-17 MED ORDER — SIMVASTATIN 40 MG PO TABS: 40.0000 mg | ORAL_TABLET | Freq: Every day | ORAL | Status: DC

## 2012-07-17 NOTE — Progress Notes (Signed)
Subjective:    Patient ID: Deanna [name], female    DOB: 19-Nov-1929, 79 y.o.   MRN: [id number]  HPI  Pt presents to the clinic today with c/o muscle pain. This started about 3 weeks ago, shortly after she started taking her crestor. She has continue taking it and it seems the muscle pains are getting worse. Most of the muscle pain is in her calves. She has never had pains like this before. She has been on Zocor without any difficulty in the past. She has not taken anything for the muscle cramps but has decided that she no longer wants to take he crestor due to the muscle pains.  Review of Systems      Past Medical History  Diagnosis Date  . HLD (hyperlipidemia)   . HTN (hypertension)   . AAA (abdominal aortic aneurysm) 2011    Dr Early repaired this  . Cirrhosis, biliary     liver bx 01/2008  . Fatigue   . Epistaxis   . OSA (obstructive sleep apnea)     mild on ss of 12/2008  . Leg pain     bilateral  . GERD (gastroesophageal reflux disease)   . Pneumonia   . Bleeding ulcer 1980s    H. Pylori  . Acute ischemic colitis 03/06/2012    Current Outpatient Prescriptions  Medication Sig Dispense Refill  . amLODipine (NORVASC) 10 MG tablet Take 10 mg by mouth daily.      . Ascorbic Acid (VITAMIN C) 500 MG tablet Take 500 mg by mouth daily.        Marland Kitchen aspirin 81 MG tablet Take 1 tablet (81 mg total) by mouth daily. Take from 03/10/12  30 tablet    . B Complex-C (SUPER B COMPLEX PO) Take 1 tablet by mouth daily.      . Cholecalciferol (VITAMIN D-3 PO) Take 1 tablet by mouth 2 (two) times daily.        Marland Kitchen co-enzyme Q-10 30 MG capsule Take 30 mg by mouth daily.       . fenofibrate 160 MG tablet Take 160 mg by mouth at bedtime.      . fish oil-omega-3 fatty acids 1000 MG capsule Take 2 g by mouth daily.        . Flaxseed, Linseed, (FLAXSEED OIL PO) Take 1 capsule by mouth daily.      . folic acid (FOLVITE) 400 MCG tablet Take 400 mcg by mouth daily.        . Nutritional Supplements (OSTEO  ADVANCE) TABS Take 1 tablet by mouth daily.      . rosuvastatin (CRESTOR) 10 MG tablet Take 0.5 tablets (5 mg total) by mouth daily.  45 tablet  1  . Thiamine HCl (VITAMIN B-1) 100 MG tablet Take 100 mg by mouth daily.        . vitamin A 84132 UNIT capsule Take 10,000 Units by mouth daily.        . vitamin B-12 (CYANOCOBALAMIN) 1000 MCG tablet Take 1,000 mcg by mouth daily.        . vitamin E 400 UNIT capsule Take 400 Units by mouth daily.         No current facility-administered medications for this visit.    No Known Allergies  History reviewed. No pertinent family history.  History   Social History  . Marital Status: Widowed    Spouse Name: N/A    Number of Children: N/A  . Years of Education: N/A   Occupational  History  . Not on file.   Social History Main Topics  . Smoking status: Former Smoker -- 52 years    Types: Cigarettes    Quit date: 02/01/2010  . Smokeless tobacco: Not on file  . Alcohol Use: No  . Drug Use: No  . Sexually Active: Not Currently   Other Topics Concern  . Not on file   Social History Narrative  . No narrative on file     Constitutional: Denies fever, malaise, fatigue, headache or abrupt weight changes. . Musculoskeletal: Pt reports myalgias. Denies decrease in range of motion, difficulty with gait,or joint pain and swelling.  Neurological: Denies dizziness, difficulty with memory, difficulty with speech or problems with balance and coordination.   No other specific complaints in a complete review of systems (except as listed in HPI above).  Objective:   Physical Exam   BP 128/82  Pulse 84  Temp(Src) 98 F (36.7 C) (Oral)  SpO2 95% Wt Readings from Last 3 Encounters:  05/01/12 202 lb (91.627 kg)  04/12/12 186 lb 6.4 oz (84.55 kg)  03/06/12 213 lb 3.2 oz (96.707 kg)    General: Appears her stated age, well developed, well nourished in NAD.  Cardiovascular: Normal rate and rhythm. S1,S2 noted. Murmur noted.  No rubs or gallops  noted. No JVD or BLE edema. No carotid bruits noted. Pulmonary/Chest: Normal effort and positive vesicular breath sounds. No respiratory distress. No wheezes, rales or ronchi noted.  Musculoskeletal: Normal range of motion. No signs of joint swelling. No difficulty with gait.    BMET    Component Value Date/Time   NA 139 03/07/2012 0545   K 4.1 03/07/2012 0545   CL 106 03/07/2012 0545   CO2 25 03/07/2012 0545   GLUCOSE 94 03/07/2012 0545   BUN 22 03/07/2012 0545   CREATININE 0.95 03/07/2012 0545   CALCIUM 8.9 03/07/2012 0545   GFRNONAA 55* 03/07/2012 0545   GFRAA 64* 03/07/2012 0545    Lipid Panel     Component Value Date/Time   CHOL 126 07/16/2012 1626   TRIG 116.0 07/16/2012 1626   HDL 41.10 07/16/2012 1626   CHOLHDL 3 07/16/2012 1626   VLDL 23.2 07/16/2012 1626   LDLCALC 62 07/16/2012 1626    CBC    Component Value Date/Time   WBC 6.4 07/16/2012 1626   RBC 5.00 07/16/2012 1626   HGB 14.0 07/16/2012 1626   HCT 42.2 07/16/2012 1626   PLT 232.0 07/16/2012 1626   MCV 84.4 07/16/2012 1626   MCH 28.8 03/08/2012 0002   MCHC 33.2 07/16/2012 1626   RDW 15.2* 07/16/2012 1626   LYMPHSABS 2.4 06/15/2010 1029   MONOABS 0.5 06/15/2010 1029   EOSABS 0.0 06/15/2010 1029   BASOSABS 0.0 06/15/2010 1029    Hgb A1C Lab Results  Component Value Date   HGBA1C 5.5 06/24/2011        Assessment & Plan:   Myalgias, bilateral lower extremities, new onset:  Will check LFT and CK Stop taking Crestor at this time After labs come back, may start on Zetia for a trial period  RTC as needed or if symptoms persist

## 2012-08-20 ENCOUNTER — Encounter: Payer: Self-pay | Admitting: Family Medicine

## 2012-11-05 ENCOUNTER — Telehealth: Payer: Self-pay | Admitting: Family Medicine

## 2012-11-05 ENCOUNTER — Encounter: Payer: Self-pay | Admitting: Nurse Practitioner

## 2012-11-05 ENCOUNTER — Ambulatory Visit (INDEPENDENT_AMBULATORY_CARE_PROVIDER_SITE_OTHER): Payer: Medicare Other | Admitting: Nurse Practitioner

## 2012-11-05 VITALS — BP 140/85 | HR 86 | Temp 97.8°F | Resp 18 | Wt 220.0 lb

## 2012-11-05 DIAGNOSIS — M25469 Effusion, unspecified knee: Secondary | ICD-10-CM

## 2012-11-05 DIAGNOSIS — M1710 Unilateral primary osteoarthritis, unspecified knee: Secondary | ICD-10-CM

## 2012-11-05 DIAGNOSIS — M25462 Effusion, left knee: Secondary | ICD-10-CM

## 2012-11-05 MED ORDER — DICLOFENAC SODIUM 1 % TD GEL
TRANSDERMAL | Status: DC
Start: 2012-11-05 — End: 2013-04-20

## 2012-11-05 NOTE — Progress Notes (Signed)
Subjective:    Deanna [name] is a 79 y.o. female who presents with knee swelling involving the left knee. Onset was gradual, starting about 2 days ago. Inciting event: none known. Current symptoms include: giving out, pain located posterior & anterior medial knee and swelling. Pain is aggravated by any weight bearing. Patient has had no prior knee problems. Evaluation to date: none. Treatment to date: none.  The following portions of the patient's history were reviewed and updated as appropriate: allergies, current medications, past medical history, past surgical history and problem list.   Review of Systems Musculoskeletal:positive for neck pain and bilateral knee pain & weakness, recent L knee pain & swelling, negative for stiff joints Neurological: negative for coordination problems and no numbness in feet  Cardiovascular: no LE swelling GI: remote history of GI bleed Objective:    BP 140/85  Pulse 86  Temp(Src) 97.8 F (36.6 C) (Oral)  Resp 18  Wt 220 lb (99.791 kg)  BMI 34.45 kg/m2  SpO2 93% Right knee:  normal, no effusion, no tenderness, but uses cane as states knee "gives out"  Left knee:  positive exam findings: effusion, medial joint line tenderness, warmth and tenderness noted posterior, medial and negative exam findings: no erythema, valgus deformity, bilateral knees, small baker's cyst     Assessment:    Left Moderate osteoarthritis on the left    Plan:    Natural history and expected course discussed. Questions answered. Transport planner distributed. NSAIDs per medication orders. Orthopedics referral. script given for walker

## 2012-11-05 NOTE — Telephone Encounter (Signed)
Patient Information:  Caller Name: Joelle  Phone: [phone]  Patient: Deanna [name], Deanna [name]  Gender: Female  DOB: 07/19/1929  Age: 79 Years  PCP: Lelon Perla.  Office Follow Up:  Does the office need to follow up with this patient?: No  Instructions For The Office: N/A  RN Note:  Denies thigh or calf pain. No known trauma.  Left knee is very swollen; limps due to pain. No appointments remain in [place] office.  Scheduled for 1600:00 11/05/12 at [facility] office with Molly Maduro, NP.  Symptoms  Reason For Call & Symptoms: Left knee is swollen. Painful when walking causing limp.  Right knee is minimally swollen.  Suspects "water on my knees." Requesting anti-inflammatory Rx.  Reviewed Health History In EMR: Yes  Reviewed Medications In EMR: Yes  Reviewed Allergies In EMR: Yes  Reviewed Surgeries / Procedures: Yes  Date of Onset of Symptoms: 11/03/2012  Treatments Tried: ice packs  Treatments Tried Worked: No  Guideline(s) Used:  Knee Pain  Disposition Per Guideline:   See Today in Office  Reason For Disposition Reached:   Very swollen joint  Advice Given:  Call Back If:  You become worse.  Patient Will Follow Care Advice:  YES

## 2012-11-05 NOTE — Telephone Encounter (Signed)
Scheduled for 1600:00 11/05/12 at [facility] office with Molly Maduro, NP.

## 2012-11-05 NOTE — Patient Instructions (Signed)
Wear and Tear Disorders of the Knee (Arthritis, Osteoarthritis)  Everyone will experience wear and tear injuries (arthritis, osteoarthritis) of the knee. These are the changes we all get as we age. They come from the joint stress of daily living. The amount of cartilage damage in your knee and your symptoms determine if you need surgery. Mild problems require approximately two months recovery time. More severe problems take several months to recover. With mild problems, your surgeon may find worn and rough cartilage surfaces. With severe changes, your surgeon may find cartilage that has completely worn away and exposed the bone. Loose bodies of bone and cartilage, bone spurs (excess bone growth), and injuries to the menisci (cushions between the large bones of your leg) are also common. All of these problems can cause pain.  For a mild wear and tear problem, rough cartilage may simply need to be shaved and smoothed. For more severe problems with areas of exposed bone, your surgeon may use an instrument for roughing up the bone surfaces to stimulate new cartilage growth. Loose bodies are usually removed. Torn menisci may be trimmed or repaired.  ABOUT THE ARTHROSCOPIC PROCEDURE  Arthroscopy is a surgical technique. It allows your orthopedic surgeon to diagnose and treat your knee injury with accuracy. The surgeon looks into your knee through a small scope. The scope is like a small (pencil-sized) telescope. Arthroscopy is less invasive than open knee surgery. You can expect a more rapid recovery. After the procedure, you will be moved to a recovery area until most of the effects of the medication have worn off. Your caregiver will discuss the test results with you.  RECOVERY  The severity of the arthritis and the type of procedure performed will determine recovery time. Other important factors include age, physical condition, medical conditions, and the type of rehabilitation program. Strengthening your muscles after  arthroscopy helps guarantee a better recovery. Follow your caregiver's instructions. Use crutches, rest, elevate, ice, and do knee exercises as instructed. Your caregivers will help you and instruct you with exercises and other physical therapy required to regain your mobility, muscle strength, and functioning following surgery. Only take over-the-counter or prescription medicines for pain, discomfort, or fever as directed by your caregiver.   SEEK MEDICAL CARE IF:   · There is increased bleeding (more than a small spot) from the wound.  · You notice redness, swelling, or increasing pain in the wound.  · Pus is coming from wound.  · You develop an unexplained oral temperature above 102° F (38.9° C) , or as your caregiver suggests.  · You notice a foul smell coming from the wound or dressing.  · You have severe pain with motion of the knee.  SEEK IMMEDIATE MEDICAL CARE IF:   · You develop a rash.  · You have difficulty breathing.  · You have any allergic problems.  MAKE SURE YOU:   · Understand these instructions.  · Will watch your condition.  · Will get help right away if you are not doing well or get worse.  Document Released: 03/18/2000 Document Revised: 06/13/2011 Document Reviewed: 08/15/2007  ExitCare® Patient Information ©2014 [employer].

## 2013-01-10 ENCOUNTER — Encounter: Payer: Self-pay | Admitting: Family Medicine

## 2013-01-18 ENCOUNTER — Other Ambulatory Visit: Payer: Self-pay | Admitting: Family Medicine

## 2013-03-13 ENCOUNTER — Ambulatory Visit (INDEPENDENT_AMBULATORY_CARE_PROVIDER_SITE_OTHER): Payer: Medicare Other | Admitting: Internal Medicine

## 2013-03-13 ENCOUNTER — Encounter: Payer: Self-pay | Admitting: Internal Medicine

## 2013-03-13 ENCOUNTER — Telehealth: Payer: Self-pay | Admitting: Family Medicine

## 2013-03-13 VITALS — BP 132/81 | HR 98 | Temp 97.5°F | Wt 222.0 lb

## 2013-03-13 DIAGNOSIS — J069 Acute upper respiratory infection, unspecified: Secondary | ICD-10-CM

## 2013-03-13 MED ORDER — AZELASTINE HCL 0.1 % NA SOLN: 2.0000 | Freq: Two times a day (BID) | NASAL | Status: DC

## 2013-03-13 NOTE — Telephone Encounter (Signed)
Forms left at check in.       KP

## 2013-03-13 NOTE — Patient Instructions (Signed)
Rest, fluids , tylenol For cough, take Mucinex DM twice a day as needed  For congestion use astelin  2 nasal sprays on each side of the nose twice a day until you feel better Call if no better in few days Call anytime if the symptoms are severe, you have high fever, short of breath, chest pain

## 2013-03-13 NOTE — Progress Notes (Signed)
Pre visit review using our clinic review tool, if applicable. No additional management support is needed unless otherwise documented below in the visit note. 

## 2013-03-13 NOTE — Telephone Encounter (Signed)
Patient came in and dropped off a Application Renewal of Permanent Disability Parking Form. Billing sheet attached and placed in Dr. Ernst Spell blue folder.

## 2013-03-13 NOTE — Telephone Encounter (Signed)
03/13/2013  Pt was here for appt, gave her original form completed.  Copy sent to batch, no charge per [place].  bw

## 2013-03-13 NOTE — Progress Notes (Signed)
   Subjective:    Patient ID: Deanna [name], female    DOB: 10-21-1929, 79 y.o.   MRN: [id number]  HPI Acute visit Symptoms started 2 days ago with sore throat, mild cough, sinus discomfort. Has not been taking anything specific for her symptoms.  Past Medical History  Diagnosis Date  . HLD (hyperlipidemia)   . HTN (hypertension)   . AAA (abdominal aortic aneurysm) 2011    Dr Early repaired this  . Cirrhosis, biliary     liver bx 01/2008  . Fatigue   . Epistaxis   . OSA (obstructive sleep apnea)     mild on ss of 12/2008  . Leg pain     bilateral  . GERD (gastroesophageal reflux disease)   . Pneumonia   . Bleeding ulcer 1980s    H. Pylori  . Acute ischemic colitis 03/06/2012   Past Surgical History  Procedure Laterality Date  . 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 Cataract extraction    . Breast biopsy    . Colonoscopy  03/08/2012    Procedure: COLONOSCOPY;  Surgeon: Iva Boop, MD;  Location: [facility] ENDOSCOPY;  Service: Endoscopy;  Laterality: N/A;  . Abdominal aortic aneurysm repair  02/01/2010   History  Substance Use Topics  . Smoking status: Former Smoker -- 52 years    Types: Cigarettes    Quit date: 02/01/2010  . Smokeless tobacco: Not on file  . Alcohol Use: No    Review of Systems Denies fever or chills No nausea, vomiting, diarrhea. No myalgias. Has some difficulty sleeping due to cough. No other family members sick. Patient is a former smoker but she does not have a history of asthma or emphysema    Objective:   Physical Exam BP 132/81  Pulse 98  Temp(Src) 97.5 F (36.4 C)  Wt 222 lb (100.699 kg)  SpO2 95% General -- alert, well-developed, NAD, no Toxic appearing.  HEENT-- Not pale. TMs normal, throat symmetric, no redness or discharge. Face symmetric, sinuses not tender to palpation. Nose slt congested. Lungs -- normal respiratory effort, no intercostal retractions, no accessory muscle use, and normal breath sounds.    Heart-- normal rate, regular rhythm, no murmur.  Extremities-- no pretibial edema bilaterally  Neurologic--  alert & oriented X3. Speech normal . Psych-- Cognition and judgment appear intact. Cooperative with normal attention span and concentration. No anxious appearing , no depressed appearing.   Assessment & Plan:   URI History of URI, exam not consistent with pneumonia or other serious conditions. Conservative treatment for now, see instructions

## 2013-03-14 ENCOUNTER — Encounter: Payer: Self-pay | Admitting: Internal Medicine

## 2013-04-20 ENCOUNTER — Encounter (HOSPITAL_COMMUNITY)
Admission: EM | Disposition: A | Discharge status: Home / Self Care | Payer: Self-pay | Source: Home / Self Care | Attending: Internal Medicine

## 2013-04-20 ENCOUNTER — Encounter (HOSPITAL_COMMUNITY): Payer: Self-pay | Admitting: Emergency Medicine

## 2013-04-20 ENCOUNTER — Inpatient Hospital Stay (HOSPITAL_COMMUNITY): Payer: Medicare Other | Admitting: Anesthesiology

## 2013-04-20 ENCOUNTER — Inpatient Hospital Stay (HOSPITAL_COMMUNITY): Payer: Medicare Other

## 2013-04-20 ENCOUNTER — Encounter (HOSPITAL_COMMUNITY): Payer: Medicare Other | Admitting: Anesthesiology

## 2013-04-20 ENCOUNTER — Encounter (HOSPITAL_COMMUNITY)
Admission: EM | Disposition: A | Discharge status: Home / Self Care | Payer: Medicare Other | Source: Home / Self Care | Attending: Internal Medicine

## 2013-04-20 ENCOUNTER — Emergency Department (HOSPITAL_COMMUNITY): Payer: Medicare Other

## 2013-04-20 ENCOUNTER — Inpatient Hospital Stay (HOSPITAL_COMMUNITY)
Admission: EM | Admit: 2013-04-20 | Discharge: 2013-04-23 | DRG: 872 | Disposition: A | Discharge status: Home / Self Care | Payer: Medicare Other | Attending: Internal Medicine | Admitting: Internal Medicine

## 2013-04-20 DIAGNOSIS — R7402 Elevation of levels of lactic acid dehydrogenase (LDH): Secondary | ICD-10-CM

## 2013-04-20 DIAGNOSIS — M129 Arthropathy, unspecified: Secondary | ICD-10-CM | POA: Diagnosis present

## 2013-04-20 DIAGNOSIS — R7401 Elevation of levels of liver transaminase levels: Secondary | ICD-10-CM

## 2013-04-20 DIAGNOSIS — I739 Peripheral vascular disease, unspecified: Secondary | ICD-10-CM | POA: Diagnosis present

## 2013-04-20 DIAGNOSIS — R000 Tachycardia, unspecified: Secondary | ICD-10-CM | POA: Diagnosis present

## 2013-04-20 DIAGNOSIS — I509 Heart failure, unspecified: Secondary | ICD-10-CM | POA: Diagnosis present

## 2013-04-20 DIAGNOSIS — Z79899 Other long term (current) drug therapy: Secondary | ICD-10-CM

## 2013-04-20 DIAGNOSIS — K8050 Calculus of bile duct without cholangitis or cholecystitis without obstruction: Secondary | ICD-10-CM

## 2013-04-20 DIAGNOSIS — I272 Pulmonary hypertension, unspecified: Secondary | ICD-10-CM

## 2013-04-20 DIAGNOSIS — R7989 Other specified abnormal findings of blood chemistry: Secondary | ICD-10-CM | POA: Diagnosis present

## 2013-04-20 DIAGNOSIS — Z6833 Body mass index (BMI) 33.0-33.9, adult: Secondary | ICD-10-CM

## 2013-04-20 DIAGNOSIS — A4151 Sepsis due to Escherichia coli [E. coli]: Principal | ICD-10-CM | POA: Diagnosis present

## 2013-04-20 DIAGNOSIS — I2789 Other specified pulmonary heart diseases: Secondary | ICD-10-CM | POA: Diagnosis present

## 2013-04-20 DIAGNOSIS — R0989 Other specified symptoms and signs involving the circulatory and respiratory systems: Secondary | ICD-10-CM | POA: Diagnosis present

## 2013-04-20 DIAGNOSIS — R740 Nonspecific elevation of levels of transaminase and lactic acid dehydrogenase [LDH]: Secondary | ICD-10-CM

## 2013-04-20 DIAGNOSIS — A419 Sepsis, unspecified organism: Secondary | ICD-10-CM | POA: Diagnosis present

## 2013-04-20 DIAGNOSIS — K8309 Other cholangitis: Secondary | ICD-10-CM

## 2013-04-20 DIAGNOSIS — R932 Abnormal findings on diagnostic imaging of liver and biliary tract: Secondary | ICD-10-CM

## 2013-04-20 DIAGNOSIS — I10 Essential (primary) hypertension: Secondary | ICD-10-CM

## 2013-04-20 DIAGNOSIS — K7460 Unspecified cirrhosis of liver: Secondary | ICD-10-CM | POA: Diagnosis present

## 2013-04-20 DIAGNOSIS — Z66 Do not resuscitate: Secondary | ICD-10-CM | POA: Diagnosis present

## 2013-04-20 DIAGNOSIS — E669 Obesity, unspecified: Secondary | ICD-10-CM | POA: Diagnosis present

## 2013-04-20 DIAGNOSIS — Z9889 Other specified postprocedural states: Secondary | ICD-10-CM

## 2013-04-20 DIAGNOSIS — K8051 Calculus of bile duct without cholangitis or cholecystitis with obstruction: Secondary | ICD-10-CM | POA: Diagnosis present

## 2013-04-20 DIAGNOSIS — K745 Biliary cirrhosis, unspecified: Secondary | ICD-10-CM

## 2013-04-20 DIAGNOSIS — R7881 Bacteremia: Secondary | ICD-10-CM

## 2013-04-20 DIAGNOSIS — D72829 Elevated white blood cell count, unspecified: Secondary | ICD-10-CM | POA: Diagnosis present

## 2013-04-20 DIAGNOSIS — R945 Abnormal results of liver function studies: Secondary | ICD-10-CM

## 2013-04-20 DIAGNOSIS — I5030 Unspecified diastolic (congestive) heart failure: Secondary | ICD-10-CM

## 2013-04-20 DIAGNOSIS — D649 Anemia, unspecified: Secondary | ICD-10-CM | POA: Diagnosis present

## 2013-04-20 DIAGNOSIS — Z87891 Personal history of nicotine dependence: Secondary | ICD-10-CM

## 2013-04-20 DIAGNOSIS — E860 Dehydration: Secondary | ICD-10-CM

## 2013-04-20 DIAGNOSIS — Z7982 Long term (current) use of aspirin: Secondary | ICD-10-CM

## 2013-04-20 DIAGNOSIS — G4733 Obstructive sleep apnea (adult) (pediatric): Secondary | ICD-10-CM | POA: Diagnosis present

## 2013-04-20 DIAGNOSIS — K831 Obstruction of bile duct: Secondary | ICD-10-CM

## 2013-04-20 DIAGNOSIS — E785 Hyperlipidemia, unspecified: Secondary | ICD-10-CM

## 2013-04-20 DIAGNOSIS — R0609 Other forms of dyspnea: Secondary | ICD-10-CM

## 2013-04-20 HISTORY — DX: Unspecified cataract: H26.9

## 2013-04-20 HISTORY — PX: ERCP: SHX5425

## 2013-04-20 HISTORY — DX: Encounter for other specified aftercare: Z51.89

## 2013-04-20 HISTORY — DX: Unspecified diastolic (congestive) heart failure: I50.30

## 2013-04-20 HISTORY — DX: Unspecified osteoarthritis, unspecified site: M19.90

## 2013-04-20 HISTORY — DX: Anemia, unspecified: D64.9

## 2013-04-20 LAB — CBC WITH DIFFERENTIAL/PLATELET
BASOS PCT: 0 % (ref 0–1)
Basophils Absolute: 0 10*3/uL (ref 0.0–0.1)
Eosinophils Absolute: 0 10*3/uL (ref 0.0–0.7)
Eosinophils Relative: 0 % (ref 0–5)
HEMATOCRIT: 41.5 % (ref 36.0–46.0)
HEMOGLOBIN: 9.7 g/dL — AB (ref 12.0–15.0)
LYMPHS PCT: 7 % — AB (ref 12–46)
Lymphs Abs: 1.4 10*3/uL (ref 0.7–4.0)
MCH: 20.4 pg — ABNORMAL LOW (ref 26.0–34.0)
MCHC: 23.4 g/dL — ABNORMAL LOW (ref 30.0–36.0)
MCV: 87.2 fL (ref 78.0–100.0)
MONOS PCT: 6 % (ref 3–12)
Monocytes Absolute: 1.2 10*3/uL — ABNORMAL HIGH (ref 0.1–1.0)
NEUTROS ABS: 17.7 10*3/uL — AB (ref 1.7–7.7)
Neutrophils Relative %: 87 % — ABNORMAL HIGH (ref 43–77)
Platelets: ADEQUATE 10*3/uL (ref 150–400)
RBC: 4.76 MIL/uL (ref 3.87–5.11)
RDW: 14.6 % (ref 11.5–15.5)
WBC: 20.3 10*3/uL — AB (ref 4.0–10.5)

## 2013-04-20 LAB — COMPREHENSIVE METABOLIC PANEL
ALT: 76 U/L — AB (ref 0–35)
AST: 116 U/L — ABNORMAL HIGH (ref 0–37)
Albumin: 3 g/dL — ABNORMAL LOW (ref 3.5–5.2)
Alkaline Phosphatase: 332 U/L — ABNORMAL HIGH (ref 39–117)
BUN: 16 mg/dL (ref 6–23)
CALCIUM: 9.5 mg/dL (ref 8.4–10.5)
CO2: 19 mEq/L (ref 19–32)
Chloride: 97 mEq/L (ref 96–112)
Creatinine, Ser: 0.87 mg/dL (ref 0.50–1.10)
GFR calc non Af Amer: 61 mL/min — ABNORMAL LOW (ref >90)
GFR, EST AFRICAN AMERICAN: 70 mL/min — AB (ref >90)
GLUCOSE: 134 mg/dL — AB (ref 70–99)
Potassium: 4.3 mEq/L (ref 3.7–5.3)
SODIUM: 135 meq/L — AB (ref 137–147)
TOTAL PROTEIN: 7.6 g/dL (ref 6.0–8.3)
Total Bilirubin: 6.1 mg/dL — ABNORMAL HIGH (ref 0.3–1.2)

## 2013-04-20 LAB — URINE MICROSCOPIC-ADD ON

## 2013-04-20 LAB — URINALYSIS, ROUTINE W REFLEX MICROSCOPIC
Glucose, UA: NEGATIVE mg/dL
KETONES UR: NEGATIVE mg/dL
NITRITE: POSITIVE — AB
PH: 5.5 (ref 5.0–8.0)
Protein, ur: NEGATIVE mg/dL
Urobilinogen, UA: 2 mg/dL — ABNORMAL HIGH (ref 0.0–1.0)

## 2013-04-20 LAB — PROTIME-INR
INR: 1.15 (ref 0.00–1.49)
PROTHROMBIN TIME: 14.5 s (ref 11.6–15.2)

## 2013-04-20 LAB — LIPASE, BLOOD: LIPASE: 11 U/L (ref 11–59)

## 2013-04-20 LAB — LACTIC ACID, PLASMA: Lactic Acid, Venous: 1.9 mmol/L (ref 0.5–2.2)

## 2013-04-20 SURGERY — EGD (ESOPHAGOGASTRODUODENOSCOPY)
Anesthesia: General

## 2013-04-20 SURGERY — ERCP, WITH INTERVENTION IF INDICATED
Anesthesia: General

## 2013-04-20 MED ORDER — FENTANYL CITRATE 0.05 MG/ML IJ SOLN
INTRAMUSCULAR | Status: AC
  Filled 2013-04-20: qty 5

## 2013-04-20 MED ORDER — FENTANYL CITRATE 0.05 MG/ML IJ SOLN: 25.0000 ug | INTRAMUSCULAR | Status: DC | PRN

## 2013-04-20 MED ORDER — GLUCAGON HCL (RDNA) 1 MG IJ SOLR
INTRAMUSCULAR | Status: AC
  Filled 2013-04-20: qty 2

## 2013-04-20 MED ORDER — SUCCINYLCHOLINE CHLORIDE 20 MG/ML IJ SOLN
INTRAMUSCULAR | Status: DC | PRN
  Administered 2013-04-20: 100 mg via INTRAVENOUS

## 2013-04-20 MED ORDER — LIDOCAINE HCL (CARDIAC) 20 MG/ML IV SOLN
INTRAVENOUS | Status: DC | PRN
  Administered 2013-04-20: 50 mg via INTRAVENOUS

## 2013-04-20 MED ORDER — GLUCAGON HCL (RDNA) 1 MG IJ SOLR
INTRAMUSCULAR | Status: DC | PRN
  Administered 2013-04-20 (×2): .25 mg via INTRAVENOUS

## 2013-04-20 MED ORDER — SODIUM CHLORIDE 0.9 % IV BOLUS (SEPSIS)
1000.0000 mL | Freq: Once | INTRAVENOUS | Status: AC
  Administered 2013-04-20: 1000 mL via INTRAVENOUS

## 2013-04-20 MED ORDER — LIDOCAINE HCL (CARDIAC) 20 MG/ML IV SOLN
INTRAVENOUS | Status: AC
  Filled 2013-04-20: qty 5

## 2013-04-20 MED ORDER — PIPERACILLIN-TAZOBACTAM 3.375 G IVPB
3.3750 g | Freq: Once | INTRAVENOUS | Status: AC
  Administered 2013-04-20: 3.375 g via INTRAVENOUS
  Filled 2013-04-20: qty 50

## 2013-04-20 MED ORDER — ONDANSETRON HCL 4 MG/2ML IJ SOLN
4.0000 mg | Freq: Once | INTRAMUSCULAR | Status: AC
Start: 2013-04-20 — End: 2013-04-20
  Administered 2013-04-20: 4 mg via INTRAVENOUS
  Filled 2013-04-20: qty 2

## 2013-04-20 MED ORDER — ONDANSETRON HCL 4 MG/2ML IJ SOLN
INTRAMUSCULAR | Status: DC | PRN
  Administered 2013-04-20: 4 mg via INTRAVENOUS

## 2013-04-20 MED ORDER — PROMETHAZINE HCL 25 MG/ML IJ SOLN: 6.2500 mg | INTRAMUSCULAR | Status: DC | PRN

## 2013-04-20 MED ORDER — ONDANSETRON HCL 4 MG/2ML IJ SOLN: 4.0000 mg | Freq: Three times a day (TID) | INTRAMUSCULAR | Status: AC | PRN

## 2013-04-20 MED ORDER — PROPOFOL 10 MG/ML IV BOLUS
INTRAVENOUS | Status: AC
  Filled 2013-04-20: qty 20

## 2013-04-20 MED ORDER — PROPOFOL 10 MG/ML IV BOLUS
INTRAVENOUS | Status: DC | PRN
  Administered 2013-04-20: 150 mg via INTRAVENOUS

## 2013-04-20 MED ORDER — SIMVASTATIN 40 MG PO TABS
40.0000 mg | ORAL_TABLET | Freq: Every day | ORAL | Status: DC
  Administered 2013-04-21 – 2013-04-22 (×2): 40 mg via ORAL
  Filled 2013-04-20 (×3): qty 1

## 2013-04-20 MED ORDER — SODIUM CHLORIDE 0.9 % IV SOLN
INTRAVENOUS | Status: AC
  Administered 2013-04-20: 10:00:00 via INTRAVENOUS

## 2013-04-20 MED ORDER — LACTATED RINGERS IV SOLN: INTRAVENOUS | Status: DC

## 2013-04-20 MED ORDER — MORPHINE SULFATE 10 MG/ML IJ SOLN: 1.0000 mg | INTRAMUSCULAR | Status: DC | PRN

## 2013-04-20 MED ORDER — ACETAMINOPHEN 325 MG PO TABS
650.0000 mg | ORAL_TABLET | Freq: Once | ORAL | Status: AC
  Administered 2013-04-20: 650 mg via ORAL
  Filled 2013-04-20: qty 2

## 2013-04-20 MED ORDER — IOHEXOL 300 MG/ML  SOLN
100.0000 mL | Freq: Once | INTRAMUSCULAR | Status: AC | PRN
  Administered 2013-04-20: 100 mL via INTRAVENOUS

## 2013-04-20 MED ORDER — IOHEXOL 300 MG/ML  SOLN
50.0000 mL | Freq: Once | INTRAMUSCULAR | Status: AC | PRN
  Administered 2013-04-20: 50 mL via ORAL

## 2013-04-20 MED ORDER — FENTANYL CITRATE 0.05 MG/ML IJ SOLN
INTRAMUSCULAR | Status: DC | PRN
  Administered 2013-04-20: 50 ug via INTRAVENOUS
  Administered 2013-04-20: 25 ug via INTRAVENOUS
  Administered 2013-04-20: 50 ug via INTRAVENOUS

## 2013-04-20 MED ORDER — FENOFIBRATE 160 MG PO TABS
160.0000 mg | ORAL_TABLET | Freq: Every day | ORAL | Status: DC
  Administered 2013-04-21 – 2013-04-22 (×2): 160 mg via ORAL
  Filled 2013-04-20 (×3): qty 1

## 2013-04-20 MED ORDER — SODIUM CHLORIDE 0.9 % IV SOLN
INTRAVENOUS | Status: DC
  Administered 2013-04-20 – 2013-04-21 (×2): via INTRAVENOUS
  Administered 2013-04-23: 50 mL/h via INTRAVENOUS

## 2013-04-20 MED ORDER — ONDANSETRON HCL 4 MG/2ML IJ SOLN
INTRAMUSCULAR | Status: AC
  Filled 2013-04-20: qty 2

## 2013-04-20 MED ORDER — SODIUM CHLORIDE 0.9 % IV SOLN
INTRAVENOUS | Status: DC | PRN
  Administered 2013-04-20: 13:00:00

## 2013-04-20 MED ORDER — PIPERACILLIN-TAZOBACTAM 3.375 G IVPB
3.3750 g | Freq: Three times a day (TID) | INTRAVENOUS | Status: DC
  Administered 2013-04-20 – 2013-04-23 (×10): 3.375 g via INTRAVENOUS
  Filled 2013-04-20 (×11): qty 50

## 2013-04-20 NOTE — Progress Notes (Signed)
  Echocardiogram 2D Echocardiogram has been performed.  Deanna [name] 04/20/2013, 11:15 AM

## 2013-04-20 NOTE — Anesthesia Procedure Notes (Signed)
Procedure Name: Intubation Date/Time: 04/20/2013 12:55 PM Performed by: Danley Danker L Patient Re-evaluated:Patient Re-evaluated prior to inductionOxygen Delivery Method: Circle system utilized Preoxygenation: Pre-oxygenation with 100% oxygen Intubation Type: IV induction Ventilation: Mask ventilation without difficulty and Oral airway inserted - appropriate to patient size Laryngoscope Size: Sabra Heck and 2 Grade View: Grade I Tube type: Oral Tube size: 7.5 mm Number of attempts: 1 Airway Equipment and Method: Stylet Placement Confirmation: ETT inserted through vocal cords under direct vision,  breath sounds checked- equal and bilateral and positive ETCO2 Secured at: 22 cm Dental Injury: Teeth and Oropharynx as per pre-operative assessment

## 2013-04-20 NOTE — ED Notes (Signed)
Pt arrived to the ED with a complaint of abdominal pain, emesis and generalized weakness.  Pt is seen at [facility] for biliary issues but has not been to her appointment this year.  Pt states that she is dry.  Pt states she has had 8 episodes of emesis Pt states she has no diarrhea.  Pt does have visible bloating in her abdomen

## 2013-04-20 NOTE — Progress Notes (Signed)
Called Ed for report at 6:59am. RN busy transporting a pt. and will call back. I called again for report at 7:39 am  and on hold until 7:49 am. Report received from [place] at 7:53am. Pt. Arrived to floor/3E at 8:15am via [place]. Alert and oriented x 4. No shortness of breath noted, pt. denies complaints of pain. Daughter at bedside.

## 2013-04-20 NOTE — Preoperative (Signed)
Beta Blockers   Reason not to administer Beta Blockers:Not Applicable 

## 2013-04-20 NOTE — Interval H&P Note (Signed)
History and Physical Interval Note:  04/20/2013 11:23 AM  Deanna [name]  has presented today for surgery, with the diagnosis of CBD stones  The various methods of treatment have been discussed with the patient and family. After consideration of risks, benefits and other options for treatment, the patient has consented to  Procedure(s): ENDOSCOPIC RETROGRADE CHOLANGIOPANCREATOGRAPHY (ERCP) (N/A) as a surgical intervention .  The patient's history has been reviewed, patient examined, no change in status, stable for surgery.  I have reviewed the patient's chart and labs.  Questions were answered to the patient's satisfaction.     Pricilla Riffle. Fuller Plan MD

## 2013-04-20 NOTE — ED Provider Notes (Signed)
Medical screening examination/treatment/procedure(s) were conducted as a shared visit with non-physician practitioner(s) or resident  and myself.  I personally evaluated the patient during the encounter and agree with the findings and plan unless otherwise indicated.    I have personally reviewed any xrays and/ or EKG's with the provider and I agree with interpretation.   Acute abdominal pain with vomiting.  Exam jaundice, dry mm, upper abdo pain with voluntary guarding, no rigidity, no distress.  Concern for duct stone or blockage from another source.  WBC elevated, concern for cholangitis.  Abx and CT scan which confirmed clinical suspicion.  GI consulted by PA.  Admitted.    Acute cholangitis, Choledocholithiasis, UTI, Hyponatremia   Mariea Clonts, MD 04/20/13 [phone]

## 2013-04-20 NOTE — H&P (Signed)
History and Physical    Jacobi Ryant WEX:[id number] DOB: 07/03/1929 DOA: 04/20/2013  Referring physician: Dr. Reather Converse PCP: Garnet Koyanagi, DO  Specialists: GI, Dr. Fuller Plan  Chief Complaint: weakness, nausea, vomiting  HPI: Jonita Hirota is a 80 y.o. female has a past medical history significant for PBC, HTN, AAA s/p repair, HLD presents to the ED with a chief complaint of severe nausea and vomiting for the past 24-26 hours, worsening this morning. She also endorses abdominal pain; she is being followed at [facility] for her [place] and this seems to be stable. She denies chest pain. She endorses shortness of breath with exertion, gradually worsening over the past 2 months. No chest pain with exertion. No orthopnea or PND. Does have intermittent bilateral LE swelling and thinks that she has gaines 20 lbs in the past 2 months. No leg swelling today, she feels dehydrated since she is unable to eat and has multiple emesis episodes. No lightheadedness or dizziness. No weakness. In the ED febrile to 100.4, tachycardic up to 103, WBC 20.3 and CT abdomen pelvis with acute biliary obstruction in CHD due to stones. GI has been consulted by EDP and [facility] asked to admit.   Review of Systems: as per HPI otherwise negative   Past Medical History  Diagnosis Date  . HLD (hyperlipidemia)   . HTN (hypertension)   . AAA (abdominal aortic aneurysm) 2011    Dr Early repaired this  . Cirrhosis, biliary     liver bx 01/2008  . Fatigue   . Epistaxis   . OSA (obstructive sleep apnea)     mild on ss of 12/2008  . Leg pain     bilateral  . GERD (gastroesophageal reflux disease)   . Pneumonia   . Bleeding ulcer 1980s    H. Pylori  . Acute ischemic colitis 03/06/2012   Past Surgical History  Procedure Laterality Date  . 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 Cataract extraction    . Breast biopsy    . Colonoscopy  03/08/2012    Procedure: COLONOSCOPY;  Surgeon: Gatha Mayer, MD;  Location: [place];  Service: Endoscopy;  Laterality: N/A;  . Abdominal aortic aneurysm repair  02/01/2010   Social History:  reports that she quit smoking about 3 years ago. Her smoking use included Cigarettes. She smoked 0.00 packs per day for 52 years. She does not have any smokeless tobacco history on file. She reports that she does not drink alcohol or use illicit drugs.  No Known Allergies  History reviewed. No pertinent family history.  Prior to Admission medications   Medication Sig Start Date End Date Taking? Authorizing Provider  amLODipine (NORVASC) 10 MG tablet Take 10 mg by mouth every morning.  12/30/11  Yes Rosalita Chessman, DO  Ascorbic Acid (VITAMIN C) 500 MG tablet Take 500 mg by mouth every morning.    Yes Historical Provider, MD  aspirin EC 81 MG tablet Take 81 mg by mouth every morning.   Yes Historical Provider, MD  B Complex-C (B-COMPLEX WITH VITAMIN C) tablet Take 1 tablet by mouth every morning.   Yes Historical Provider, MD  cholecalciferol (VITAMIN D) 1000 UNITS tablet Take 1,000 Units by mouth 2 (two) times daily.   Yes Historical Provider, MD  co-enzyme Q-10 30 MG capsule Take 30 mg by mouth daily.    Yes Historical Provider, MD  diclofenac sodium (VOLTAREN) 1 % GEL Apply 2 g topically 3 (  three) times daily as needed (knee pain and swelling).   Yes Historical Provider, MD  fenofibrate 160 MG tablet Take 160 mg by mouth at bedtime. 12/30/11  Yes Alferd Apa Lowne, DO  fish oil-omega-3 fatty acids 1000 MG capsule Take 2 g by mouth every morning.    Yes Historical Provider, MD  Flaxseed, Linseed, (FLAXSEED OIL PO) Take 1 capsule by mouth every morning.    Yes Historical Provider, MD  folic acid (FOLVITE) 932 MCG tablet Take 400 mcg by mouth every morning.    Yes Historical Provider, MD  Nutritional Supplements (OSTEO ADVANCE) TABS Take 1 tablet by mouth every morning.    Yes Historical Provider, MD  simvastatin (ZOCOR) 40 MG tablet Take 1 tablet (40 mg total) by mouth at bedtime.  07/17/12  Yes Webb Silversmith, NP  Thiamine HCl (VITAMIN B-1) 100 MG tablet Take 100 mg by mouth every morning.    Yes Historical Provider, MD  VITAMIN A PO Take 1 tablet by mouth every morning.   Yes Historical Provider, MD  vitamin B-12 (CYANOCOBALAMIN) 1000 MCG tablet Take 1,000 mcg by mouth every morning.    Yes Historical Provider, MD  vitamin E 400 UNIT capsule Take 400 Units by mouth every morning.    Yes Historical Provider, MD   Physical Exam: Filed Vitals:   04/20/13 0123 04/20/13 0353 04/20/13 0418  BP: 101/47 142/72   Pulse: 103 92   Temp: 98.6 F (37 C) 99.8 F (37.7 C) 100.4 F (38 C)  TempSrc: Oral Oral Rectal  Resp: 18 18   SpO2: 96% 94%      General:  No apparent distress  Eyes: scleral icterus present  ENT: moist oropharynx  Neck: supple, no JVD  Cardiovascular: regular rate without MRG; 2+ peripheral pulses  Respiratory: CTA biL, good air movement without wheezing, rhonchi or crackled  Abdomen: soft, mild tenderness to palpation RUQ  Skin: no rashes  Musculoskeletal: no peripheral edema  Psychiatric: normal mood and affect  Neurologic: non focal  Labs on Admission:  Basic Metabolic Panel:  Recent Labs Lab 04/20/13 0256  NA 135*  K 4.3  CL 97  CO2 19  GLUCOSE 134*  BUN 16  CREATININE 0.87  CALCIUM 9.5   Liver Function Tests:  Recent Labs Lab 04/20/13 0256  AST 116*  ALT 76*  ALKPHOS 332*  BILITOT 6.1*  PROT 7.6  ALBUMIN 3.0*    Recent Labs Lab 04/20/13 0256  LIPASE 11   CBC:  Recent Labs Lab 04/20/13 0256  WBC 20.3*  NEUTROABS 17.7*  HGB 9.7*  HCT 41.5  MCV 87.2  PLT PLATELET CLUMPS NOTED ON SMEAR, COUNT APPEARS ADEQUATE   Radiological Exams on Admission: Ct Abdomen Pelvis W Contrast  04/20/2013   CLINICAL DATA:  Abdominal pain and bloating. Vomiting. Generalized weakness. Leukocytosis.  EXAM: CT ABDOMEN AND PELVIS WITH CONTRAST  TECHNIQUE: Multidetector CT imaging of the abdomen and pelvis was performed using the  standard protocol following bolus administration of intravenous contrast.  CONTRAST:  65mL OMNIPAQUE IOHEXOL 300 MG/ML SOLN, 157mL OMNIPAQUE IOHEXOL 300 MG/ML SOLN  COMPARISON:  CT of the abdomen and pelvis from 03/08/2012  FINDINGS: Minimal right basilar atelectasis is noted.  A 1.9 cm hypodensity at the medial right hepatic lobe is only minimally changed in size from 2013, and is likely benign. There is mild prominence of the intrahepatic biliary ducts, with mild distention of the gallbladder. An apparent obstructing 1.7 cm stone is noted within the common hepatic duct, just proximal  to the head of the pancreas, and an additional 1.0 cm stone is seen more distally, just proximal to the duodenal ampulla. There is mild associated gallbladder wall thickening, without significant soft tissue inflammation.  The pancreas and adrenal glands are unremarkable. The pancreatic duct is not well assessed, but no abnormal dilatation of the pancreatic duct is seen.  Scattered bilateral renal cysts are seen, measuring up to 7.8 cm on the right side. The kidneys are otherwise unremarkable in appearance. There is no evidence of hydronephrosis. No renal or ureteral stones are seen. No perinephric stranding is appreciated.  A mild broad-based anterior abdominal wall hernia is seen superior to the umbilicus, with partial herniation of the transverse colon.  No free fluid is identified. The small bowel is unremarkable in appearance. The stomach is within normal limits. No acute vascular abnormalities are seen.  There is aneurysmal dilatation of the abdominal aorta to 3.5 cm in AP dimension at the level of the renal arteries. An aortoiliac stent graft is noted. Diffuse associated calcifications are seen.  The appendix is normal in caliber and contains air, without evidence for appendicitis. The colon is otherwise unremarkable in appearance.  The bladder is mildly distended and grossly unremarkable. The uterus is within normal limits.  The ovaries are relatively symmetric. No suspicious adnexal masses are seen. No inguinal lymphadenopathy is seen.  No acute osseous abnormalities are identified. Vacuum phenomenon is noted along the lumbar spine, with disc space narrowing at L5-S1, and chronic mild compression deformity at L4.  IMPRESSION: 1. 1.7 cm and 1.0 cm stones noted within the common hepatic duct, just proximal to the head of the pancreas and at the duodenal ampulla, respectively, causing acute biliary obstruction. Associated mild prominence of the intrahepatic biliary ducts and mild gallbladder distention. Mild associated gallbladder wall thickening noted, without evidence for cholecystitis. 2. Scattered bilateral renal cysts, measuring up to 7.8 cm. 3. Mild broad-based anterior abdominal wall hernia seen superior to the umbilicus, with partial herniation of the transverse colon. No evidence for obstruction or strangulation. 4. Aneurysmal dilatation of the abdominal aorta to 3.5 cm in AP dimension at the level of the renal arteries. Diffuse associated calcifications seen. Underlying aortoiliac stent graft noted. 5. 1.9 cm right hepatic cyst again noted.   Electronically Signed   By: Garald Balding M.D.   On: 04/20/2013 06:03    EKG: Independently reviewed.  Assessment/Plan Principal Problem:   Acute cholangitis Active Problems:   HYPERLIPIDEMIA   HYPERTENSION   BILIARY CIRRHOSIS, PRIMARY   Leukocytosis   Dyspnea on exertion   Sepsis due to acute cholangitis - will start empiric Zosyn. GI has been consulted by EDP, will likely need decompression with ERCP.  - blood cultures obtained, pending.  Acute biliary obstruction - due to common hepatic duct stones. ERCP as above, surgery to see patient per GI as well.  Acute on chronic dyspnea on exertion - will obtain a 2D echo. Previous one in 2011 unremarkable.  HTN - hold home medications in the setting of sepsis.  HLD - continue home medications PBC - followed by GI as an  outpatient.  Elevated LFTs - due to #1,2,6   Diet: NPO Fluids: NS DVT Prophylaxis: SCDs  Code Status: DNR  Family Communication: daughter at bedside  Disposition Plan: inpatient  Time spent: 11  Costin M. Cruzita Lederer, MD [facility] Pager [phone]  If 7PM-7AM, please contact night-coverage [email or web address] Password [email or web address] 04/20/2013, 9:22 AM

## 2013-04-20 NOTE — ED Provider Notes (Signed)
CSN: [id number]     Arrival date & time 04/20/13  0100 History   First MD Initiated Contact with Patient 04/20/13 0201     Chief Complaint  Patient presents with  . Abdominal Pain   (Consider location/radiation/quality/duration/timing/severity/associated sxs/prior Treatment) HPI History provided by pt.   Pt has had several episodes of watery, projectile vomiting since yesterday am, following breakfast.  Other than first episode, emesis not associated w/ eating.  Has also had diffuse abdominal cramping and bloating and decreased frequency of bowel movements and has not been passing gas.  Her daughter reports that she has been fatigued, becomes dyspneic w/ exertion and has had the chills.   No known fever and denies chest pain and GU sx.  H/o AAA, primary biliary cirrhosis, acute ischemic colitis and bleeding gastric ulcer.  Past abd surgeries include AAA repair.   Past Medical History  Diagnosis Date  . HLD (hyperlipidemia)   . HTN (hypertension)   . AAA (abdominal aortic aneurysm) 2011    Dr Early repaired this  . Cirrhosis, biliary     liver bx 01/2008  . Fatigue   . Epistaxis   . OSA (obstructive sleep apnea)     mild on ss of 12/2008  . Leg pain     bilateral  . GERD (gastroesophageal reflux disease)   . Pneumonia   . Bleeding ulcer 1980s    H. Pylori  . Acute ischemic colitis 03/06/2012   Past Surgical History  Procedure Laterality Date  . 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 Cataract extraction    . Breast biopsy    . Colonoscopy  03/08/2012    Procedure: COLONOSCOPY;  Surgeon: Gatha Mayer, MD;  Location: [place];  Service: Endoscopy;  Laterality: N/A;  . Abdominal aortic aneurysm repair  02/01/2010   History reviewed. No pertinent family history. History  Substance Use Topics  . Smoking status: Former Smoker -- 52 years    Types: Cigarettes    Quit date: 02/01/2010  . Smokeless tobacco: Not on file  . Alcohol Use: No   OB History    Grav Para Term Preterm Abortions TAB SAB Ect Mult Living                 Review of Systems  All other systems reviewed and are negative.    Allergies  Review of patient's allergies indicates no known allergies.  Home Medications   Current Outpatient Rx  Name  Route  Sig  Dispense  Refill  . amLODipine (NORVASC) 10 MG tablet   Oral   Take 10 mg by mouth every morning.          . Ascorbic Acid (VITAMIN C) 500 MG tablet   Oral   Take 500 mg by mouth every morning.          Marland Kitchen aspirin EC 81 MG tablet   Oral   Take 81 mg by mouth every morning.         . B Complex-C (B-COMPLEX WITH VITAMIN C) tablet   Oral   Take 1 tablet by mouth every morning.         . cholecalciferol (VITAMIN D) 1000 UNITS tablet   Oral   Take 1,000 Units by mouth 2 (two) times daily.         Marland Kitchen co-enzyme Q-10 30 MG capsule   Oral   Take 30 mg by mouth daily.          Marland Kitchen  diclofenac sodium (VOLTAREN) 1 % GEL   Topical   Apply 2 g topically 3 (three) times daily as needed (knee pain and swelling).         . fenofibrate 160 MG tablet   Oral   Take 160 mg by mouth at bedtime.         . fish oil-omega-3 fatty acids 1000 MG capsule   Oral   Take 2 g by mouth every morning.          . Flaxseed, Linseed, (FLAXSEED OIL PO)   Oral   Take 1 capsule by mouth every morning.          . folic acid (FOLVITE) 161 MCG tablet   Oral   Take 400 mcg by mouth every morning.          . Nutritional Supplements (OSTEO ADVANCE) TABS   Oral   Take 1 tablet by mouth every morning.          . simvastatin (ZOCOR) 40 MG tablet   Oral   Take 1 tablet (40 mg total) by mouth at bedtime.   90 tablet   3   . Thiamine HCl (VITAMIN B-1) 100 MG tablet   Oral   Take 100 mg by mouth every morning.          Marland Kitchen VITAMIN A PO   Oral   Take 1 tablet by mouth every morning.         . vitamin B-12 (CYANOCOBALAMIN) 1000 MCG tablet   Oral   Take 1,000 mcg by mouth every morning.          .  vitamin E 400 UNIT capsule   Oral   Take 400 Units by mouth every morning.           BP 101/47  Pulse 103  Temp(Src) 98.6 F (37 C) (Oral)  Resp 18  SpO2 96% Physical Exam  Nursing note and vitals reviewed. Constitutional: She is oriented to person, place, and time. She appears well-developed and well-nourished. No distress.  HENT:  Head: Normocephalic and atraumatic.  Eyes:  Normal appearance  Neck: Normal range of motion.  Cardiovascular: Normal rate and regular rhythm.   Pulmonary/Chest: Effort normal and breath sounds normal. No respiratory distress.  Abdominal: Soft. Bowel sounds are normal. She exhibits no distension and no mass. There is tenderness. There is no rebound and no guarding.  Diffuse ttp, worst in epigastrium and RUQ  Genitourinary:  No CVA tenderness  Musculoskeletal: Normal range of motion. She exhibits no edema and no tenderness.  Neurological: She is alert and oriented to person, place, and time.  Skin: Skin is warm and dry. No rash noted.  Psychiatric: She has a normal mood and affect. Her behavior is normal.    ED Course  Procedures (including critical care time) Labs Review Labs Reviewed  COMPREHENSIVE METABOLIC PANEL - Abnormal; Notable for the following:    Sodium 135 (*)    Glucose, Bld 134 (*)    Albumin 3.0 (*)    AST 116 (*)    ALT 76 (*)    Alkaline Phosphatase 332 (*)    Total Bilirubin 6.1 (*)    GFR calc non Af Amer 61 (*)    GFR calc Af Amer 70 (*)    All other components within normal limits  CBC WITH DIFFERENTIAL - Abnormal; Notable for the following:    WBC 20.3 (*)    Hemoglobin 9.7 (*)    MCH 20.4 (*)  MCHC 23.4 (*)    Neutrophils Relative % 87 (*)    Lymphocytes Relative 7 (*)    Neutro Abs 17.7 (*)    Monocytes Absolute 1.2 (*)    All other components within normal limits  URINALYSIS, ROUTINE W REFLEX MICROSCOPIC - Abnormal; Notable for the following:    Color, Urine ORANGE (*)    APPearance CLOUDY (*)     Specific Gravity, Urine >1.046 (*)    Hgb urine dipstick TRACE (*)    Bilirubin Urine LARGE (*)    Urobilinogen, UA 2.0 (*)    Nitrite POSITIVE (*)    Leukocytes, UA MODERATE (*)    All other components within normal limits  URINE MICROSCOPIC-ADD ON - Abnormal; Notable for the following:    Bacteria, UA MANY (*)    All other components within normal limits  CULTURE, BLOOD (ROUTINE X 2)  CULTURE, BLOOD (ROUTINE X 2)  URINE CULTURE  LACTIC ACID, PLASMA  LIPASE, BLOOD  PROTIME-INR  COMPREHENSIVE METABOLIC PANEL  CBC WITH DIFFERENTIAL   Imaging Review No results found.  EKG Interpretation   None       MDM   1. Acute cholangitis    80yo F w/ h/o bleeding gastric ulcers, primary biliary cirrhosis, ischemic colitis and AAA repair presents w/ abd pain, bloating, vomiting.  Has had decreased frequency of bowel movements and has not been passing gas.  On exam, afebrile, NAD, abd obese but soft/non-distended, diffusely ttp, worst in epigastrium and LUQ, skin appears slightly jaundiced.  Labs sig for elevated transaminases, alk phos and bilirubin, leukocytosis and anemia.  CT abd pending.  Pt to receive IVF and zofran.  CT shows common biliary duct stones w/ obstruction.  Results discussed w/ patient and her family.  She is currently comfortable.  She has already received zosyn. [facility] and [facility] consulted.        Remer Macho, PA-C 04/20/13 2009

## 2013-04-20 NOTE — Anesthesia Preprocedure Evaluation (Addendum)
Anesthesia Evaluation  Patient identified by MRN, date of birth, ID band Patient awake    Reviewed: Allergy & Precautions, H&P , NPO status , Patient's Chart, lab work & pertinent test results  Airway Mallampati: II TM Distance: >3 FB Neck ROM: Full    Dental  (+) Edentulous Upper and Edentulous Lower   Pulmonary shortness of breath, sleep apnea , pneumonia -, former smoker,  breath sounds clear to auscultation  Pulmonary exam normal       Cardiovascular hypertension, Pt. on medications + Peripheral Vascular Disease (R&G AAA) Rhythm:Regular Rate:Normal     Neuro/Psych negative neurological ROS  negative psych ROS   GI/Hepatic Neg liver ROS, PUD, GERD-  ,Hx of biliary cirrhosis   Endo/Other  negative endocrine ROS  Renal/GU negative Renal ROS  negative genitourinary   Musculoskeletal negative musculoskeletal ROS (+)   Abdominal   Peds  Hematology  (+) anemia ,   Anesthesia Other Findings   Reproductive/Obstetrics                        Anesthesia Physical Anesthesia Plan  ASA: III and emergent  Anesthesia Plan: General   Post-op Pain Management:    Induction: Intravenous  Airway Management Planned: Oral ETT  Additional Equipment:   Intra-op Plan:   Post-operative Plan: Extubation in OR  Informed Consent: I have reviewed the patients History and Physical, chart, labs and discussed the procedure including the risks, benefits and alternatives for the proposed anesthesia with the patient or authorized representative who has indicated his/her understanding and acceptance.   Dental advisory given  Plan Discussed with: CRNA  Anesthesia Plan Comments:        Anesthesia Quick Evaluation

## 2013-04-20 NOTE — Consult Note (Signed)
Referring Provider: [facility] Primary Care Physician:  Garnet Koyanagi, DO Primary Gastroenterologist:  Dr.Gessner-gets usual GI care at [facility] for PBC-Dr. Diehl-family says not going back  Reason for Consultation:  CBD stones  HPI: Deanna [name] is a 80 y.o. female with history of primary biliary cirrhosis not on any therapy, history of obstructive sleep apnea, GERD, peripheral vascular disease with previous femoral bypass 2011. She had an abdominal aortic aneurysm repair and 2011 and had an episode of ischemic colitis in 2013. She was admitted through the emergency room last night after onset yesterday with multiple episodes of projectile vomiting nausea and upper abdominal pain associated with chills. Her family states she had about 8 episodes of vomiting. She apparently had a similar episode last weekend with nausea vomiting and chills which resolved. She did not have any associated fever and apparently felt okay in between these episodes. She did have some associated back pain when she came in last night which has resolved this morning. Work up through the emergency room with labs and CT scan of the abdomen and pelvis shows prominent intrahepatic ducts and a somewhat distended gallbladder with mildly thickened walls. She has a 1.7 cm stone in the common hepatic duct and a smaller stone more distally just proximal to the duodenal ampulla. Bilirubin is 6.1, WBC was 20,000 on admission. Blood cultures are pending and she has been started on IV Zosyn. Patient feels well this morning has not had any further abdominal pain nausea or vomiting..   Past Medical History  Diagnosis Date  . HLD (hyperlipidemia)   . HTN (hypertension)   . AAA (abdominal aortic aneurysm) 2011    Dr Early repaired this  . Cirrhosis, biliary     liver bx 01/2008  . Fatigue   . Epistaxis   . OSA (obstructive sleep apnea)     mild on ss of 12/2008  . Leg pain     bilateral  . GERD (gastroesophageal reflux disease)     . Pneumonia   . Bleeding ulcer 1980s    H. Pylori  . Acute ischemic colitis 03/06/2012    Past Surgical History  Procedure Laterality Date  . 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 Cataract extraction    . Breast biopsy    . Colonoscopy  03/08/2012    Procedure: COLONOSCOPY;  Surgeon: Gatha Mayer, MD;  Location: [facility];  Service: Endoscopy;  Laterality: N/A;  . Abdominal aortic aneurysm repair  02/01/2010    Prior to Admission medications   Medication Sig Start Date End Date Taking? Authorizing Provider  amLODipine (NORVASC) 10 MG tablet Take 10 mg by mouth every morning.  12/30/11  Yes Rosalita Chessman, DO  Ascorbic Acid (VITAMIN C) 500 MG tablet Take 500 mg by mouth every morning.    Yes Historical Provider, MD  aspirin EC 81 MG tablet Take 81 mg by mouth every morning.   Yes Historical Provider, MD  B Complex-C (B-COMPLEX WITH VITAMIN C) tablet Take 1 tablet by mouth every morning.   Yes Historical Provider, MD  cholecalciferol (VITAMIN D) 1000 UNITS tablet Take 1,000 Units by mouth 2 (two) times daily.   Yes Historical Provider, MD  co-enzyme Q-10 30 MG capsule Take 30 mg by mouth daily.    Yes Historical Provider, MD  diclofenac sodium (VOLTAREN) 1 % GEL Apply 2 g topically 3 (three) times daily as needed (knee pain and swelling).   Yes Historical Provider,  MD  fenofibrate 160 MG tablet Take 160 mg by mouth at bedtime. 12/30/11  Yes Alferd Apa Lowne, DO  fish oil-omega-3 fatty acids 1000 MG capsule Take 2 g by mouth every morning.    Yes Historical Provider, MD  Flaxseed, Linseed, (FLAXSEED OIL PO) Take 1 capsule by mouth every morning.    Yes Historical Provider, MD  folic acid (FOLVITE) 585 MCG tablet Take 400 mcg by mouth every morning.    Yes Historical Provider, MD  Nutritional Supplements (OSTEO ADVANCE) TABS Take 1 tablet by mouth every morning.    Yes Historical Provider, MD  simvastatin (ZOCOR) 40 MG tablet Take 1 tablet (40 mg total) by mouth  at bedtime. 07/17/12  Yes Webb Silversmith, NP  Thiamine HCl (VITAMIN B-1) 100 MG tablet Take 100 mg by mouth every morning.    Yes Historical Provider, MD  VITAMIN A PO Take 1 tablet by mouth every morning.   Yes Historical Provider, MD  vitamin B-12 (CYANOCOBALAMIN) 1000 MCG tablet Take 1,000 mcg by mouth every morning.    Yes Historical Provider, MD  vitamin E 400 UNIT capsule Take 400 Units by mouth every morning.    Yes Historical Provider, MD    Current Facility-Administered Medications  Medication Dose Route Frequency Provider Last Rate Last Dose  . 0.9 %  sodium chloride infusion   Intravenous STAT Catherine E Schinlever, PA-C      . 0.9 %  sodium chloride infusion   Intravenous Continuous Costin Karlyne Greenspan, MD      . Derrill Memo ON 04/21/2013] fenofibrate tablet 160 mg  160 mg Oral QHS Costin Karlyne Greenspan, MD      . ondansetron [facility]) injection 4 mg  4 mg Intravenous Q8H PRN Arville Lime Schinlever, PA-C      . piperacillin-tazobactam (ZOSYN) IVPB 3.375 g  3.375 g Intravenous [address] [place], [facility]      . [START ON 04/21/2013] simvastatin (ZOCOR) tablet 40 mg  40 mg Oral QHS Caren Griffins, MD        Allergies as of 04/20/2013  . (No Known Allergies)    History reviewed. No pertinent family history.  History   Social History  . Marital Status: Widowed    Spouse Name: N/A    Number of Children: N/A  . Years of Education: N/A   Occupational History  . Not on file.   Social History Main Topics  . Smoking status: Former Smoker -- 52 years    Types: Cigarettes    Quit date: 02/01/2010  . Smokeless tobacco: Not on file  . Alcohol Use: No  . Drug Use: No  . Sexual Activity: Not Currently   Other Topics Concern  . Not on file   Social History Narrative  . No narrative on file    Review of Systems: Pertinent positive and negative review of systems were noted in the above HPI section.  All other review of systems was otherwise negative.  Physical Exam: Vital signs in  last 24 hours: Temp:  [98.6 F (37 C)-100.4 F (38 C)] 100.4 F (38 C) (01/17 0418) Pulse Rate:  [92-103] 92 (01/17 0353) Resp:  [18] 18 (01/17 0353) BP: (101-142)/(47-72) 142/72 mmHg (01/17 0353) SpO2:  [94 %-96 %] 94 % (01/17 0353)   General:   Alert,  Well-developed,elderly WF, well-nourished, pleasant and cooperative in NAD-jaundiced Head:  Normocephalic and atraumatic. Eyes:  Sclera icteric.   Conjunctiva pink. Ears:  Normal auditory acuity. Nose:  No deformity, discharge,  or  lesions. Mouth:  No deformity or lesions.   Neck:  Supple; no masses or thyromegaly. Lungs:  Clear throughout to auscultation.   No wheezes, crackles, or rhonchi.  Heart:  Regular rate and rhythm; no murmurs, clicks, rubs,  or gallops. Abdomen:  Soft,nontender, BS active,nonpalp mass or hsm.   Rectal:  Deferred  Msk:  Symmetrical without gross deformities. . Pulses:  Normal pulses noted. Extremities:  Without clubbing or edema. Neurologic:  Alert and  oriented x4;  grossly normal neurologically. Skin:  Intact without significant lesions or rashes.. Psych:  Alert and cooperative. Normal mood and affect.  Intake/Output from previous day:   Intake/Output this shift:    Lab Results:  Recent Labs  04/20/13 0256  WBC 20.3*  HGB 9.7*  HCT 41.5  PLT PLATELET CLUMPS NOTED ON SMEAR, COUNT APPEARS ADEQUATE   BMET  Recent Labs  04/20/13 0256  NA 135*  K 4.3  CL 97  CO2 19  GLUCOSE 134*  BUN 16  CREATININE 0.87  CALCIUM 9.5   LFT  Recent Labs  04/20/13 0256  PROT 7.6  ALBUMIN 3.0*  AST 116*  ALT 76*  ALKPHOS 332*  BILITOT 6.1*    IMPRESSION:  #18  80 year old female with primary biliary cirrhosis now presenting with acute cholangitis secondary to choledocholithiasis. #2 thickened gallbladder wall rule out cholecystitis #3 peripheral vascular disease #4 history of ischemic colitis #5 obstructive sleep apnea #6 GERD   PLAN: Continue Zosyn Check stat pro time Patient will be  scheduled for ERCP with sphincterotomy and stone extraction with Dr. Fuller Plan later today. Procedure was discussed in detail with the patient and her family including 5-6% risk of pancreatitis. They are agreeable to proceed.   Amy Esterwood  04/20/2013, 9:28 AM     Attending physician's note   I have taken a history, examined the patient and reviewed the chart. I agree with the Advanced Practitioner's note, impression and recommendations. Has PBC but not on mediations. Admitted with choledocholithiasis and possible cholangitis. R/O cholecystitis. IV antibiotics. Surgery consult. ERCP, sphincterotomy, stone extraction later today.   Ladene Artist, MD [facility]

## 2013-04-20 NOTE — H&P (View-Only) (Signed)
Referring Provider: [facility] hospitalist Primary Care Physician:  Garnet Koyanagi, DO Primary Gastroenterologist:  Dr.Gessner-gets usual GI care at [facility] for PBC-Dr. Diehl-family says not going back  Reason for Consultation:  CBD stones  HPI: Deanna [name] is a 80 y.o. female with history of primary biliary cirrhosis not on any therapy, history of obstructive sleep apnea, GERD, peripheral vascular disease with previous femoral bypass 2011. She had an abdominal aortic aneurysm repair and 2011 and had an episode of ischemic colitis in 2013. She was admitted through the emergency room last night after onset yesterday with multiple episodes of projectile vomiting nausea and upper abdominal pain associated with chills. Her family states she had about 8 episodes of vomiting. She apparently had a similar episode last weekend with nausea vomiting and chills which resolved. She did not have any associated fever and apparently felt okay in between these episodes. She did have some associated back pain when she came in last night which has resolved this morning. Work up through the emergency room with labs and CT scan of the abdomen and pelvis shows prominent intrahepatic ducts and a somewhat distended gallbladder with mildly thickened walls. She has a 1.7 cm stone in the common hepatic duct and a smaller stone more distally just proximal to the duodenal ampulla. Bilirubin is 6.1, WBC was 20,000 on admission. Blood cultures are pending and she has been started on IV Zosyn. Patient feels well this morning has not had any further abdominal pain nausea or vomiting..   Past Medical History  Diagnosis Date  . HLD (hyperlipidemia)   . HTN (hypertension)   . AAA (abdominal aortic aneurysm) 2011    Dr Early repaired this  . Cirrhosis, biliary     liver bx 01/2008  . Fatigue   . Epistaxis   . OSA (obstructive sleep apnea)     mild on ss of 12/2008  . Leg pain     bilateral  . GERD (gastroesophageal reflux disease)     . Pneumonia   . Bleeding ulcer 1980s    H. Pylori  . Acute ischemic colitis 03/06/2012    Past Surgical History  Procedure Laterality Date  . 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 Cataract extraction    . Breast biopsy    . Colonoscopy  03/08/2012    Procedure: COLONOSCOPY;  Surgeon: Gatha Mayer, MD;  Location: [facility];  Service: Endoscopy;  Laterality: N/A;  . Abdominal aortic aneurysm repair  02/01/2010    Prior to Admission medications   Medication Sig Start Date End Date Taking? Authorizing Provider  amLODipine (NORVASC) 10 MG tablet Take 10 mg by mouth every morning.  12/30/11  Yes Rosalita Chessman, DO  Ascorbic Acid (VITAMIN C) 500 MG tablet Take 500 mg by mouth every morning.    Yes Historical Provider, MD  aspirin EC 81 MG tablet Take 81 mg by mouth every morning.   Yes Historical Provider, MD  B Complex-C (B-COMPLEX WITH VITAMIN C) tablet Take 1 tablet by mouth every morning.   Yes Historical Provider, MD  cholecalciferol (VITAMIN D) 1000 UNITS tablet Take 1,000 Units by mouth 2 (two) times daily.   Yes Historical Provider, MD  co-enzyme Q-10 30 MG capsule Take 30 mg by mouth daily.    Yes Historical Provider, MD  diclofenac sodium (VOLTAREN) 1 % GEL Apply 2 g topically 3 (three) times daily as needed (knee pain and swelling).   Yes Historical Provider,  MD  fenofibrate 160 MG tablet Take 160 mg by mouth at bedtime. 12/30/11  Yes Alferd Apa Lowne, DO  fish oil-omega-3 fatty acids 1000 MG capsule Take 2 g by mouth every morning.    Yes Historical Provider, MD  Flaxseed, Linseed, (FLAXSEED OIL PO) Take 1 capsule by mouth every morning.    Yes Historical Provider, MD  folic acid (FOLVITE) 585 MCG tablet Take 400 mcg by mouth every morning.    Yes Historical Provider, MD  Nutritional Supplements (OSTEO ADVANCE) TABS Take 1 tablet by mouth every morning.    Yes Historical Provider, MD  simvastatin (ZOCOR) 40 MG tablet Take 1 tablet (40 mg total) by mouth  at bedtime. 07/17/12  Yes Webb Silversmith, NP  Thiamine HCl (VITAMIN B-1) 100 MG tablet Take 100 mg by mouth every morning.    Yes Historical Provider, MD  VITAMIN A PO Take 1 tablet by mouth every morning.   Yes Historical Provider, MD  vitamin B-12 (CYANOCOBALAMIN) 1000 MCG tablet Take 1,000 mcg by mouth every morning.    Yes Historical Provider, MD  vitamin E 400 UNIT capsule Take 400 Units by mouth every morning.    Yes Historical Provider, MD    Current Facility-Administered Medications  Medication Dose Route Frequency Provider Last Rate Last Dose  . 0.9 %  sodium chloride infusion   Intravenous STAT Catherine E Schinlever, PA-C      . 0.9 %  sodium chloride infusion   Intravenous Continuous Costin Karlyne Greenspan, MD      . Derrill Memo ON 04/21/2013] fenofibrate tablet 160 mg  160 mg Oral QHS Costin Karlyne Greenspan, MD      . ondansetron [facility]) injection 4 mg  4 mg Intravenous Q8H PRN Arville Lime Schinlever, PA-C      . piperacillin-tazobactam (ZOSYN) IVPB 3.375 g  3.375 g Intravenous [address] [place], [facility]      . [START ON 04/21/2013] simvastatin (ZOCOR) tablet 40 mg  40 mg Oral QHS Caren Griffins, MD        Allergies as of 04/20/2013  . (No Known Allergies)    History reviewed. No pertinent family history.  History   Social History  . Marital Status: Widowed    Spouse Name: N/A    Number of Children: N/A  . Years of Education: N/A   Occupational History  . Not on file.   Social History Main Topics  . Smoking status: Former Smoker -- 52 years    Types: Cigarettes    Quit date: 02/01/2010  . Smokeless tobacco: Not on file  . Alcohol Use: No  . Drug Use: No  . Sexual Activity: Not Currently   Other Topics Concern  . Not on file   Social History Narrative  . No narrative on file    Review of Systems: Pertinent positive and negative review of systems were noted in the above HPI section.  All other review of systems was otherwise negative.  Physical Exam: Vital signs in  last 24 hours: Temp:  [98.6 F (37 C)-100.4 F (38 C)] 100.4 F (38 C) (01/17 0418) Pulse Rate:  [92-103] 92 (01/17 0353) Resp:  [18] 18 (01/17 0353) BP: (101-142)/(47-72) 142/72 mmHg (01/17 0353) SpO2:  [94 %-96 %] 94 % (01/17 0353)   General:   Alert,  Well-developed,elderly WF, well-nourished, pleasant and cooperative in NAD-jaundiced Head:  Normocephalic and atraumatic. Eyes:  Sclera icteric.   Conjunctiva pink. Ears:  Normal auditory acuity. Nose:  No deformity, discharge,  or  lesions. Mouth:  No deformity or lesions.   Neck:  Supple; no masses or thyromegaly. Lungs:  Clear throughout to auscultation.   No wheezes, crackles, or rhonchi.  Heart:  Regular rate and rhythm; no murmurs, clicks, rubs,  or gallops. Abdomen:  Soft,nontender, BS active,nonpalp mass or hsm.   Rectal:  Deferred  Msk:  Symmetrical without gross deformities. . Pulses:  Normal pulses noted. Extremities:  Without clubbing or edema. Neurologic:  Alert and  oriented x4;  grossly normal neurologically. Skin:  Intact without significant lesions or rashes.. Psych:  Alert and cooperative. Normal mood and affect.  Intake/Output from previous day:   Intake/Output this shift:    Lab Results:  Recent Labs  04/20/13 0256  WBC 20.3*  HGB 9.7*  HCT 41.5  PLT PLATELET CLUMPS NOTED ON SMEAR, COUNT APPEARS ADEQUATE   BMET  Recent Labs  04/20/13 0256  NA 135*  K 4.3  CL 97  CO2 19  GLUCOSE 134*  BUN 16  CREATININE 0.87  CALCIUM 9.5   LFT  Recent Labs  04/20/13 0256  PROT 7.6  ALBUMIN 3.0*  AST 116*  ALT 76*  ALKPHOS 332*  BILITOT 6.1*    IMPRESSION:  #41  80 year old female with primary biliary cirrhosis now presenting with acute cholangitis secondary to choledocholithiasis. #2 thickened gallbladder wall rule out cholecystitis #3 peripheral vascular disease #4 history of ischemic colitis #5 obstructive sleep apnea #6 GERD   PLAN: Continue Zosyn Check stat pro time Patient will be  scheduled for ERCP with sphincterotomy and stone extraction with Dr. Fuller Plan later today. Procedure was discussed in detail with the patient and her family including 5-6% risk of pancreatitis. They are agreeable to proceed.   Amy Esterwood  04/20/2013, 9:28 AM     Attending physician's note   I have taken a history, examined the patient and reviewed the chart. I agree with the Advanced Practitioner's note, impression and recommendations. Has PBC but not on mediations. Admitted with choledocholithiasis and possible cholangitis. R/O cholecystitis. IV antibiotics. Surgery consult. ERCP, sphincterotomy, stone extraction later today.   Ladene Artist, MD [facility]

## 2013-04-20 NOTE — Transfer of Care (Signed)
Immediate Anesthesia Transfer of Care Note  Patient: Deanna [name]  Procedure(s) Performed: Procedure(s): ENDOSCOPIC RETROGRADE CHOLANGIOPANCREATOGRAPHY (ERCP) (N/A)  Patient Location: PACU  Anesthesia Type:General  Level of Consciousness: awake and oriented  Airway & Oxygen Therapy: Patient Spontanous Breathing and Patient connected to face mask oxygen  Post-op Assessment: Report given to PACU RN and Post -op Vital signs reviewed and stable  Post vital signs: Reviewed and stable  Complications: No apparent anesthesia complications

## 2013-04-20 NOTE — ED Notes (Signed)
Attempted to call report

## 2013-04-20 NOTE — ED Notes (Signed)
Attempted to call floor for report, receiving Nurse in the middle of receiving report from night shift,  Teacher, English as a foreign language notified, report given to [facility]

## 2013-04-20 NOTE — Op Note (Signed)
[facility] [place], [id number]   ERCP PROCEDURE REPORT  PATIENT: Deanna [name], Deanna [name]  MR# :[id number] BIRTHDATE: [birth year]  GENDER: Female ENDOSCOPIST: Ladene Artist, MD, [facility] REFERRED BY: [facility] PROCEDURE DATE:  04/20/2013 PROCEDURE:   ERCP with sphincterotomy/papillotomy and ERCP with removal of calculus/calculi ASA CLASS:   Class III INDICATIONS:abnormal abdominal CT.  abnormal liver function test . Upper abdominal pain. MEDICATIONS: General endotracheal anesthesia (GETA) TOPICAL ANESTHETIC: none DESCRIPTION OF PROCEDURE:   After the risks benefits and alternatives of the procedure were thoroughly explained, informed consent was obtained.  The     endoscope was introduced through the mouth  and advanced to the second portion of the duodenum . 1.  There was a large periampullary diverticulum. 2.  The ampulla was located in the second portion of the duodenum at the edge of the diverticulum. 3.  With guidewire in the bile duct, a biliary sphincterotomy was performed using the sphincterotome. 4.  Two stones were found in the mid common bile duct.  They measured 17 mm and 10 mm in diameter. 5.  Using a stone extraction balloon the bile duct was swept three times.  Two stones were removed from the bile duct successfully. Excellent biliary drainage and reflux of air into the biliary tree following stone extraction. 6.  Mild generalized dilation was found in the CBD and intrahepatic ducts. The PD was not cannulated or injected by intention.  The scope was then completely withdrawn from the patient and the procedure completed.   COMPLICATIONS: .  There were no complications.  ENDOSCOPIC IMPRESSION: 1.   Large periampullary diverticulum 2.   Biliary sphincterotomy performed 3.   2 large stones in the mid common bile duct; balloon stone extraction balloon performed 4.   Mild biliary tree dilation  RECOMMENDATIONS: 1.  Continue IV  antibiotics 2.  Trend liver enzymes  eSigned:  Ladene Artist, MD, [facility] 04/20/2013 1:58 PM

## 2013-04-20 NOTE — Anesthesia Postprocedure Evaluation (Signed)
Anesthesia Post Note  Patient: Deanna [name]  Procedure(s) Performed: Procedure(s) (LRB): ENDOSCOPIC RETROGRADE CHOLANGIOPANCREATOGRAPHY (ERCP) (N/A)  Anesthesia type: General  Patient location: PACU  Post pain: Pain level controlled  Post assessment: Post-op Vital signs reviewed  Last Vitals:  Filed Vitals:   04/20/13 2114  BP: 121/66  Pulse: 74  Temp: 36.6 C  Resp: 16    Post vital signs: Reviewed  Level of consciousness: sedated  Complications: No apparent anesthesia complications

## 2013-04-21 ENCOUNTER — Encounter (HOSPITAL_COMMUNITY): Payer: Self-pay | Admitting: Internal Medicine

## 2013-04-21 DIAGNOSIS — K8309 Other cholangitis: Secondary | ICD-10-CM

## 2013-04-21 DIAGNOSIS — R0609 Other forms of dyspnea: Secondary | ICD-10-CM

## 2013-04-21 DIAGNOSIS — I5030 Unspecified diastolic (congestive) heart failure: Secondary | ICD-10-CM

## 2013-04-21 DIAGNOSIS — I272 Pulmonary hypertension, unspecified: Secondary | ICD-10-CM | POA: Diagnosis present

## 2013-04-21 DIAGNOSIS — I2789 Other specified pulmonary heart diseases: Secondary | ICD-10-CM

## 2013-04-21 DIAGNOSIS — I509 Heart failure, unspecified: Secondary | ICD-10-CM

## 2013-04-21 DIAGNOSIS — D72829 Elevated white blood cell count, unspecified: Secondary | ICD-10-CM

## 2013-04-21 DIAGNOSIS — I10 Essential (primary) hypertension: Secondary | ICD-10-CM

## 2013-04-21 DIAGNOSIS — R0989 Other specified symptoms and signs involving the circulatory and respiratory systems: Secondary | ICD-10-CM

## 2013-04-21 DIAGNOSIS — E785 Hyperlipidemia, unspecified: Secondary | ICD-10-CM

## 2013-04-21 HISTORY — DX: Unspecified diastolic (congestive) heart failure: I50.30

## 2013-04-21 LAB — COMPREHENSIVE METABOLIC PANEL
ALT: 62 U/L — AB (ref 0–35)
AST: 80 U/L — ABNORMAL HIGH (ref 0–37)
Albumin: 2.4 g/dL — ABNORMAL LOW (ref 3.5–5.2)
Alkaline Phosphatase: 289 U/L — ABNORMAL HIGH (ref 39–117)
BUN: 12 mg/dL (ref 6–23)
CALCIUM: 8.8 mg/dL (ref 8.4–10.5)
CO2: 22 mEq/L (ref 19–32)
CREATININE: 0.9 mg/dL (ref 0.50–1.10)
Chloride: 101 mEq/L (ref 96–112)
GFR, EST AFRICAN AMERICAN: 68 mL/min — AB (ref >90)
GFR, EST NON AFRICAN AMERICAN: 58 mL/min — AB (ref >90)
GLUCOSE: 96 mg/dL (ref 70–99)
Potassium: 3.6 mEq/L — ABNORMAL LOW (ref 3.7–5.3)
Sodium: 137 mEq/L (ref 137–147)
TOTAL PROTEIN: 6.4 g/dL (ref 6.0–8.3)
Total Bilirubin: 8.2 mg/dL — ABNORMAL HIGH (ref 0.3–1.2)

## 2013-04-21 LAB — CBC WITH DIFFERENTIAL/PLATELET
Basophils Absolute: 0 10*3/uL (ref 0.0–0.1)
Basophils Relative: 0 % (ref 0–1)
Eosinophils Absolute: 0 10*3/uL (ref 0.0–0.7)
Eosinophils Relative: 0 % (ref 0–5)
HEMATOCRIT: 37.9 % (ref 36.0–46.0)
HEMOGLOBIN: 12.2 g/dL (ref 12.0–15.0)
LYMPHS ABS: 1.8 10*3/uL (ref 0.7–4.0)
LYMPHS PCT: 16 % (ref 12–46)
MCH: 28.4 pg (ref 26.0–34.0)
MCHC: 32.2 g/dL (ref 30.0–36.0)
MCV: 88.1 fL (ref 78.0–100.0)
MONO ABS: 0.9 10*3/uL (ref 0.1–1.0)
Monocytes Relative: 8 % (ref 3–12)
NEUTROS ABS: 8.7 10*3/uL — AB (ref 1.7–7.7)
NEUTROS PCT: 76 % (ref 43–77)
Platelets: 242 10*3/uL (ref 150–400)
RBC: 4.3 MIL/uL (ref 3.87–5.11)
RDW: 14.2 % (ref 11.5–15.5)
WBC: 11.3 10*3/uL — AB (ref 4.0–10.5)

## 2013-04-21 LAB — URINE CULTURE

## 2013-04-21 MED ORDER — ACETAMINOPHEN 325 MG PO TABS
650.0000 mg | ORAL_TABLET | Freq: Four times a day (QID) | ORAL | Status: DC | PRN
  Administered 2013-04-22 – 2013-04-23 (×2): 650 mg via ORAL
  Filled 2013-04-21 (×2): qty 2

## 2013-04-21 NOTE — Progress Notes (Signed)
CRITICAL VALUE ALERT  Critical value received: Positive Blood Culture, gram - rods aerobic blte   Date of notification:  04/21/2013  Time of notification:  0015  Critical value read back:yes  Nurse who received alert:  Harlow Asa  MD notified (1st page):  Hospitalist  Time of first page:  0020  MD notified (2nd page):  Time of second page:  Responding MD:  Hospitalist Fredirick Maudlin)  Time MD responded:  [phone]

## 2013-04-21 NOTE — Progress Notes (Signed)
Patient ID: Deanna [name], female   DOB: 12/02/1929, 80 y.o.   MRN: [id number]    Progress Note   Subjective   Feels better, no c/o abdominal pain-hungry. Tired, not sleeping well here  BC + gram neg rods   Objective   Vital signs in last 24 hours: Temp:  [97.9 F (36.6 C)-98.8 F (37.1 C)] 98.2 F (36.8 C) (01/18 6237) Pulse Rate:  [69-74] 73 (01/18 0638) Resp:  [16-25] 16 (01/18 6283) BP: (104-143)/(60-101) 116/67 mmHg (01/18 1517) SpO2:  [93 %-100 %] 93 % (01/18 6160) Last BM Date: 04/20/13 General:    white female in NAD,less jaundiced Heart:  Regular rate and rhythm; no murmurs Lungs: Respirations even and unlabored, lungs CTA bilaterally Abdomen:  Soft, nontender and nondistended. Normal bowel sounds. Extremities:  Without edema. Neurologic:  Alert and oriented,  grossly normal neurologically. Psych:  Cooperative. Normal mood and affect.  Intake/Output from previous day: 01/17 0701 - 01/18 0700 In: 1434.2 [I.V.:1384.2; IV Piggyback:50] Out: 550 [Urine:550] Intake/Output this shift:    Lab Results:  Recent Labs  04/20/13 0256 04/21/13 0500  WBC 20.3* 11.3*  HGB 9.7* 12.2  HCT 41.5 37.9  PLT PLATELET CLUMPS NOTED ON SMEAR, COUNT APPEARS ADEQUATE 242   BMET  Recent Labs  04/20/13 0256 04/21/13 0500  NA 135* 137  K 4.3 3.6*  CL 97 101  CO2 19 22  GLUCOSE 134* 96  BUN 16 12  CREATININE 0.87 0.90  CALCIUM 9.5 8.8   LFT  Recent Labs  04/21/13 0500  PROT 6.4  ALBUMIN 2.4*  AST 80*  ALT 62*  ALKPHOS 289*  BILITOT 8.2*   PT/INR  Recent Labs  04/20/13 1051  LABPROT 14.5  INR 1.15    Studies/Results: Ct Abdomen Pelvis W Contrast  04/20/2013   CLINICAL DATA:  Abdominal pain and bloating. Vomiting. Generalized weakness. Leukocytosis.  EXAM: CT ABDOMEN AND PELVIS WITH CONTRAST  TECHNIQUE: Multidetector CT imaging of the abdomen and pelvis was performed using the standard protocol following bolus administration of intravenous contrast.   CONTRAST:  35mL OMNIPAQUE IOHEXOL 300 MG/ML SOLN, 142mL OMNIPAQUE IOHEXOL 300 MG/ML SOLN  COMPARISON:  CT of the abdomen and pelvis from 03/08/2012  FINDINGS: Minimal right basilar atelectasis is noted.  A 1.9 cm hypodensity at the medial right hepatic lobe is only minimally changed in size from 2013, and is likely benign. There is mild prominence of the intrahepatic biliary ducts, with mild distention of the gallbladder. An apparent obstructing 1.7 cm stone is noted within the common hepatic duct, just proximal to the head of the pancreas, and an additional 1.0 cm stone is seen more distally, just proximal to the duodenal ampulla. There is mild associated gallbladder wall thickening, without significant soft tissue inflammation.  The pancreas and adrenal glands are unremarkable. The pancreatic duct is not well assessed, but no abnormal dilatation of the pancreatic duct is seen.  Scattered bilateral renal cysts are seen, measuring up to 7.8 cm on the right side. The kidneys are otherwise unremarkable in appearance. There is no evidence of hydronephrosis. No renal or ureteral stones are seen. No perinephric stranding is appreciated.  A mild broad-based anterior abdominal wall hernia is seen superior to the umbilicus, with partial herniation of the transverse colon.  No free fluid is identified. The small bowel is unremarkable in appearance. The stomach is within normal limits. No acute vascular abnormalities are seen.  There is aneurysmal dilatation of the abdominal aorta to 3.5 cm in AP dimension at  the level of the renal arteries. An aortoiliac stent graft is noted. Diffuse associated calcifications are seen.  The appendix is normal in caliber and contains air, without evidence for appendicitis. The colon is otherwise unremarkable in appearance.  The bladder is mildly distended and grossly unremarkable. The uterus is within normal limits. The ovaries are relatively symmetric. No suspicious adnexal masses are seen.  No inguinal lymphadenopathy is seen.  No acute osseous abnormalities are identified. Vacuum phenomenon is noted along the lumbar spine, with disc space narrowing at L5-S1, and chronic mild compression deformity at L4.  IMPRESSION: 1. 1.7 cm and 1.0 cm stones noted within the common hepatic duct, just proximal to the head of the pancreas and at the duodenal ampulla, respectively, causing acute biliary obstruction. Associated mild prominence of the intrahepatic biliary ducts and mild gallbladder distention. Mild associated gallbladder wall thickening noted, without evidence for cholecystitis. 2. Scattered bilateral renal cysts, measuring up to 7.8 cm. 3. Mild broad-based anterior abdominal wall hernia seen superior to the umbilicus, with partial herniation of the transverse colon. No evidence for obstruction or strangulation. 4. Aneurysmal dilatation of the abdominal aorta to 3.5 cm in AP dimension at the level of the renal arteries. Diffuse associated calcifications seen. Underlying aortoiliac stent graft noted. 5. 1.9 cm right hepatic cyst again noted.   Electronically Signed   By: Garald Balding M.D.   On: 04/20/2013 06:03   Dg Ercp Biliary & Pancreatic Ducts  04/20/2013   CLINICAL DATA:  Common bile duct stones.  EXAM: ERCP  TECHNIQUE: Multiple spot images obtained with the fluoroscopic device and submitted for interpretation post-procedure.  COMPARISON:  CT 04/20/2013  FINDINGS: The common bile duct was cannulated and opacified. Common bile duct is dilated. Filling defects in the common bile duct are compatible with stones. A balloon sweep was performed for stone removal. Cannot exclude residual filling defects in the common bile duct at the end of the procedure. Minor filling of the intrahepatic bile ducts.  IMPRESSION: Removal of biliary stones.  These images were submitted for radiologic interpretation only. Please see the procedural report for the amount of contrast and the fluoroscopy time utilized.    Electronically Signed   By: Markus Daft M.D.   On: 04/20/2013 17:10      Assessment / Plan:   #1 80 yo female with acute cholangitis/bacteremia-he is stable s/p ERCP and stone extraction yesterday-t bili lagging, other  LFTs improved, WBC coming down Day # 2  Zosyn Await final BC result-continue IV abx another 24 hours at least Advance diet #2 PBC-compensated, stable  Principal Problem:   Acute cholangitis Active Problems:   HYPERLIPIDEMIA   HYPERTENSION   BILIARY CIRRHOSIS, PRIMARY   Leukocytosis   Dyspnea on exertion   Calculus of bile duct without mention of cholecystitis or obstruction   Nonspecific (abnormal) findings on radiological and other examination of biliary tract    LOS: 1 day   Amy Esterwood  04/21/2013, 9:09 AM     Attending physician's note   I have taken an interval history, reviewed the chart and examined the patient. I agree with the Advanced Practitioner's note, impression and recommendations. Much improved post ERCP/sphincterotomy with stone extraction. Continue IV antibiotics given positive blood cultures. Trend LFTs.  Pricilla Riffle. Fuller Plan, MD [facility]

## 2013-04-21 NOTE — Progress Notes (Signed)
[facility] PROGRESS NOTE  Kailynne Ferrington CNO:[id number] DOB: [birth year] DOA: 04/20/2013 PCP: Garnet Koyanagi, DO  Assessment/Plan:  Sepsis due to acute cholangitis  - S/P ERCP with stone extraction on 04/20/2013 - Continue Zosyn. -Patient's blood culture positive for gram-negative rods. Counseled patient and daughter that most likely patient will have to have antibiotic for 4-6 weeks -In a.m. will contact ID for phone consult regarding choice of antibiotics and duration of treatment  -blood cultures; speciation/sensitivity pending .  Acute biliary obstruction  -See sepsis due to acute cholangitis .  Diastolic CHF with LVH  -Counseled patient and family that this could be partially responsible for her Acute on chronic dyspnea on exertion. However secondary to patient's low BP would not be able start her on ACE inhibitor, or beta blocker at this time. -After patient's BP improves following treatment of sepsis will start patient on lisinopril.  Pulmonary hypertension -See diastolic CHF  HTN -Do not restart patient's Norvasc, when patient does become hypertensive start lisinopril  HLD  - continue home medications   PBC  - followed by GI as an outpatient.   Elevated LFTs  - due to #1,2,6 -Monitor closely  Leukocytosis -See sepsis     Code Status: DNR Family Communication: Daughter present for discussion of plan of care Disposition Plan: Per GI   Consultants: Dr. Lucio Edward  Procedures: Echocardiogram 04/20/2013 - Left ventricle: Wall thickness was increased mild to moderate LVH.  -LVEF= 60%- 65%.  -(grade 1 diastolic dysfunction). - Aortic valve: Trileaflet; normal thickness, mildly calcified leaflets. Mild regurgitation. - Mitral valve: Mildly to moderately calcified annulus. Moderately calcified leaflets posterior. - Right atrium: The atrium was mildly dilated. - Pulmonary arteries: Systolic pressure was mildly increased. PA peak pressure: 76mm Hg  (S).     04/20/2013 ERCP performed by Dr. Lucio Edward  1. Large periampullary diverticulum  2. Biliary sphincterotomy performed  3. 2 large stones in the mid common bile duct; balloon stone  extraction balloon performed  4. Mild biliary tree dilation   Antibiotics: Zosyn 1/17>>   HPI/Subjective: Deanna [name] is a 80 y.o.WF PMHx PBC, HTN, AAA s/p repair, HLD, OSA, bleeding ulcer (H. pyloric positive), ischemic colitis, presented to the ED with a chief complaint of severe nausea and vomiting for the past 24-26 hours, worsening this morning. She also endorses abdominal pain; she is being followed at [facility] for her [place] and this seems to be stable. She denies chest pain. She endorses shortness of breath with exertion, gradually worsening over the past 2 months. No chest pain with exertion. No orthopnea or PND. Does have intermittent bilateral LE swelling and thinks that she has gaines 20 lbs in the past 2 months. No leg swelling today, she feels dehydrated since she is unable to eat and has multiple emesis episodes. No lightheadedness or dizziness. No weakness. In the ED febrile to 100.4, tachycardic up to 103, WBC 20.3 and CT abdomen pelvis with acute biliary obstruction in CHD due to stones. GI has been consulted by EDP. 04/21/2013 patient sitting in bed comfortably, negative N./V., negative abdominal pain, negative SOB   Objective: Filed Vitals:   04/20/13 1355 04/20/13 1423 04/20/13 2114 04/21/13 0638  BP: 131/60 104/60 121/66 116/67  Pulse: 70 69 74 73  Temp: 98.8 F (37.1 C) 98.7 F (37.1 C) 97.9 F (36.6 C) 98.2 F (36.8 C)  TempSrc:   Oral Oral  Resp: 23 20 16 16   Height:      Weight:      SpO2:  96% 93% 93% 93%    Intake/Output Summary (Last 24 hours) at 04/21/13 1254 Last data filed at 04/20/13 2310  Gross per 24 hour  Intake 1384.17 ml  Output    550 ml  Net 834.17 ml   Filed Weights   04/20/13 0815  Weight: 100.245 kg (221 lb)    Exam:   General:  A./O. x4,  NAD  Cardiovascular: Regular rhythm and rate, negative murmurs rubs or gallops  Respiratory: Clear to auscultation bilateral  Abdomen: Soft, nontender, and nondistended, plus bowel sounds, large vertical midline well-healed surgical incision consistent with repair of AAA  Musculoskeletal: Negative pedal edema   Data Reviewed: Basic Metabolic Panel:  Recent Labs Lab 04/20/13 0256 04/21/13 0500  NA 135* 137  K 4.3 3.6*  CL 97 101  CO2 19 22  GLUCOSE 134* 96  BUN 16 12  CREATININE 0.87 0.90  CALCIUM 9.5 8.8   Liver Function Tests:  Recent Labs Lab 04/20/13 0256 04/21/13 0500  AST 116* 80*  ALT 76* 62*  ALKPHOS 332* 289*  BILITOT 6.1* 8.2*  PROT 7.6 6.4  ALBUMIN 3.0* 2.4*    Recent Labs Lab 04/20/13 0256  LIPASE 11   No results found for this basename: AMMONIA,  in the last 168 hours CBC:  Recent Labs Lab 04/20/13 0256 04/21/13 0500  WBC 20.3* 11.3*  NEUTROABS 17.7* 8.7*  HGB 9.7* 12.2  HCT 41.5 37.9  MCV 87.2 88.1  PLT PLATELET CLUMPS NOTED ON SMEAR, COUNT APPEARS ADEQUATE 242   Cardiac Enzymes: No results found for this basename: CKTOTAL, CKMB, CKMBINDEX, TROPONINI,  in the last 168 hours BNP (last 3 results) No results found for this basename: PROBNP,  in the last 8760 hours CBG: No results found for this basename: GLUCAP,  in the last 168 hours  Recent Results (from the past 240 hour(s))  CULTURE, BLOOD (ROUTINE X 2)     Status: None   Collection Time    04/20/13  4:55 AM      Result Value Range Status   Specimen Description BLOOD RIGHT HAND   Final   Special Requests BOTTLES DRAWN AEROBIC AND ANAEROBIC 5CC   Final   Culture  Setup Time     Final   Value: 04/20/2013 11:15     Performed at [facility]   Culture     Final   Value:        BLOOD CULTURE RECEIVED NO GROWTH TO DATE CULTURE WILL BE HELD FOR 5 DAYS BEFORE ISSUING A FINAL NEGATIVE REPORT     Performed at [facility]   Report Status PENDING   Incomplete  CULTURE,  BLOOD (ROUTINE X 2)     Status: None   Collection Time    04/20/13  5:04 AM      Result Value Range Status   Specimen Description BLOOD LEFT HAND   Final   Special Requests BOTTLES DRAWN AEROBIC AND ANAEROBIC 5CC   Final   Culture  Setup Time     Final   Value: 04/20/2013 11:15     Performed at [facility]   Culture     Final   Value: GRAM NEGATIVE RODS     Note: Gram Stain Report Called to,Read Back By and Verified With: JENNIFER MALMSELT 04/21/2013 0015AM [facility]     Performed at [facility]   Report Status PENDING   Incomplete     Studies: Ct Abdomen Pelvis W Contrast  04/20/2013   CLINICAL  DATA:  Abdominal pain and bloating. Vomiting. Generalized weakness. Leukocytosis.  EXAM: CT ABDOMEN AND PELVIS WITH CONTRAST  TECHNIQUE: Multidetector CT imaging of the abdomen and pelvis was performed using the standard protocol following bolus administration of intravenous contrast.  CONTRAST:  38mL OMNIPAQUE IOHEXOL 300 MG/ML SOLN, 174mL OMNIPAQUE IOHEXOL 300 MG/ML SOLN  COMPARISON:  CT of the abdomen and pelvis from 03/08/2012  FINDINGS: Minimal right basilar atelectasis is noted.  A 1.9 cm hypodensity at the medial right hepatic lobe is only minimally changed in size from 2013, and is likely benign. There is mild prominence of the intrahepatic biliary ducts, with mild distention of the gallbladder. An apparent obstructing 1.7 cm stone is noted within the common hepatic duct, just proximal to the head of the pancreas, and an additional 1.0 cm stone is seen more distally, just proximal to the duodenal ampulla. There is mild associated gallbladder wall thickening, without significant soft tissue inflammation.  The pancreas and adrenal glands are unremarkable. The pancreatic duct is not well assessed, but no abnormal dilatation of the pancreatic duct is seen.  Scattered bilateral renal cysts are seen, measuring up to 7.8 cm on the right side. The kidneys are otherwise unremarkable in  appearance. There is no evidence of hydronephrosis. No renal or ureteral stones are seen. No perinephric stranding is appreciated.  A mild broad-based anterior abdominal wall hernia is seen superior to the umbilicus, with partial herniation of the transverse colon.  No free fluid is identified. The small bowel is unremarkable in appearance. The stomach is within normal limits. No acute vascular abnormalities are seen.  There is aneurysmal dilatation of the abdominal aorta to 3.5 cm in AP dimension at the level of the renal arteries. An aortoiliac stent graft is noted. Diffuse associated calcifications are seen.  The appendix is normal in caliber and contains air, without evidence for appendicitis. The colon is otherwise unremarkable in appearance.  The bladder is mildly distended and grossly unremarkable. The uterus is within normal limits. The ovaries are relatively symmetric. No suspicious adnexal masses are seen. No inguinal lymphadenopathy is seen.  No acute osseous abnormalities are identified. Vacuum phenomenon is noted along the lumbar spine, with disc space narrowing at L5-S1, and chronic mild compression deformity at L4.  IMPRESSION: 1. 1.7 cm and 1.0 cm stones noted within the common hepatic duct, just proximal to the head of the pancreas and at the duodenal ampulla, respectively, causing acute biliary obstruction. Associated mild prominence of the intrahepatic biliary ducts and mild gallbladder distention. Mild associated gallbladder wall thickening noted, without evidence for cholecystitis. 2. Scattered bilateral renal cysts, measuring up to 7.8 cm. 3. Mild broad-based anterior abdominal wall hernia seen superior to the umbilicus, with partial herniation of the transverse colon. No evidence for obstruction or strangulation. 4. Aneurysmal dilatation of the abdominal aorta to 3.5 cm in AP dimension at the level of the renal arteries. Diffuse associated calcifications seen. Underlying aortoiliac stent graft  noted. 5. 1.9 cm right hepatic cyst again noted.   Electronically Signed   By: Garald Balding M.D.   On: 04/20/2013 06:03   Dg Ercp Biliary & Pancreatic Ducts  04/20/2013   CLINICAL DATA:  Common bile duct stones.  EXAM: ERCP  TECHNIQUE: Multiple spot images obtained with the fluoroscopic device and submitted for interpretation post-procedure.  COMPARISON:  CT 04/20/2013  FINDINGS: The common bile duct was cannulated and opacified. Common bile duct is dilated. Filling defects in the common bile duct are compatible with stones. A  balloon sweep was performed for stone removal. Cannot exclude residual filling defects in the common bile duct at the end of the procedure. Minor filling of the intrahepatic bile ducts.  IMPRESSION: Removal of biliary stones.  These images were submitted for radiologic interpretation only. Please see the procedural report for the amount of contrast and the fluoroscopy time utilized.   Electronically Signed   By: Markus Daft M.D.   On: 04/20/2013 17:10    Scheduled Meds: . fenofibrate  160 mg Oral QHS  . piperacillin-tazobactam (ZOSYN)  IV  3.375 g Intravenous Q8H  . simvastatin  40 mg Oral QHS   Continuous Infusions: . sodium chloride 50 mL/hr at 04/20/13 1529    Principal Problem:   Acute cholangitis Active Problems:   HYPERLIPIDEMIA   HYPERTENSION   BILIARY CIRRHOSIS, PRIMARY   Leukocytosis   Dyspnea on exertion   Calculus of bile duct without mention of cholecystitis or obstruction   Nonspecific (abnormal) findings on radiological and other examination of biliary tract    Time spent: 70 minutes   WOODS, CURTIS, J  [facility] Pager [phone]. If 7PM-7AM, please contact night-coverage at [email or web address], password [email or web address] 04/21/2013, 12:54 PM  LOS: 1 day

## 2013-04-22 ENCOUNTER — Encounter (HOSPITAL_COMMUNITY): Payer: Self-pay | Admitting: Gastroenterology

## 2013-04-22 DIAGNOSIS — K8309 Other cholangitis: Secondary | ICD-10-CM

## 2013-04-22 DIAGNOSIS — I509 Heart failure, unspecified: Secondary | ICD-10-CM

## 2013-04-22 DIAGNOSIS — K8050 Calculus of bile duct without cholangitis or cholecystitis without obstruction: Secondary | ICD-10-CM

## 2013-04-22 DIAGNOSIS — E860 Dehydration: Secondary | ICD-10-CM

## 2013-04-22 LAB — CBC WITH DIFFERENTIAL/PLATELET
Basophils Absolute: 0 10*3/uL (ref 0.0–0.1)
Basophils Relative: 0 % (ref 0–1)
EOS ABS: 0 10*3/uL (ref 0.0–0.7)
EOS PCT: 0 % (ref 0–5)
HCT: 38 % (ref 36.0–46.0)
Hemoglobin: 12.2 g/dL (ref 12.0–15.0)
Lymphocytes Relative: 28 % (ref 12–46)
Lymphs Abs: 2.1 10*3/uL (ref 0.7–4.0)
MCH: 28.5 pg (ref 26.0–34.0)
MCHC: 32.1 g/dL (ref 30.0–36.0)
MCV: 88.8 fL (ref 78.0–100.0)
Monocytes Absolute: 0.6 10*3/uL (ref 0.1–1.0)
Monocytes Relative: 7 % (ref 3–12)
NEUTROS PCT: 64 % (ref 43–77)
Neutro Abs: 4.7 10*3/uL (ref 1.7–7.7)
PLATELETS: 248 10*3/uL (ref 150–400)
RBC: 4.28 MIL/uL (ref 3.87–5.11)
RDW: 13.9 % (ref 11.5–15.5)
WBC: 7.4 10*3/uL (ref 4.0–10.5)

## 2013-04-22 LAB — COMPREHENSIVE METABOLIC PANEL
ALBUMIN: 2.4 g/dL — AB (ref 3.5–5.2)
ALT: 45 U/L — AB (ref 0–35)
AST: 40 U/L — ABNORMAL HIGH (ref 0–37)
Alkaline Phosphatase: 283 U/L — ABNORMAL HIGH (ref 39–117)
BUN: 11 mg/dL (ref 6–23)
CO2: 24 mEq/L (ref 19–32)
Calcium: 9 mg/dL (ref 8.4–10.5)
Chloride: 101 mEq/L (ref 96–112)
Creatinine, Ser: 0.9 mg/dL (ref 0.50–1.10)
GFR calc Af Amer: 68 mL/min — ABNORMAL LOW (ref >90)
GFR calc non Af Amer: 58 mL/min — ABNORMAL LOW (ref >90)
Glucose, Bld: 113 mg/dL — ABNORMAL HIGH (ref 70–99)
POTASSIUM: 3.2 meq/L — AB (ref 3.7–5.3)
SODIUM: 139 meq/L (ref 137–147)
TOTAL PROTEIN: 6.5 g/dL (ref 6.0–8.3)
Total Bilirubin: 3.1 mg/dL — ABNORMAL HIGH (ref 0.3–1.2)

## 2013-04-22 LAB — HEPATIC FUNCTION PANEL
ALBUMIN: 2.4 g/dL — AB (ref 3.5–5.2)
ALK PHOS: 283 U/L — AB (ref 39–117)
ALT: 45 U/L — AB (ref 0–35)
AST: 42 U/L — ABNORMAL HIGH (ref 0–37)
BILIRUBIN DIRECT: 2.1 mg/dL — AB (ref 0.0–0.3)
BILIRUBIN TOTAL: 3.3 mg/dL — AB (ref 0.3–1.2)
Indirect Bilirubin: 1.2 mg/dL — ABNORMAL HIGH (ref 0.3–0.9)
Total Protein: 6.5 g/dL (ref 6.0–8.3)

## 2013-04-22 MED ORDER — LISINOPRIL 5 MG PO TABS
5.0000 mg | ORAL_TABLET | Freq: Every day | ORAL | Status: DC
  Administered 2013-04-22 – 2013-04-23 (×2): 5 mg via ORAL
  Filled 2013-04-22 (×2): qty 1

## 2013-04-22 NOTE — Consult Note (Signed)
Reason for Consult:  Acute cholangitis; s/p ERCP 04/20/13. Referring Physician: Dr. Delfin Edis  Deanna [name] is an 80 y.o. female.  HPI: 80 y/o who developed nausea and vomiting on 04/19/13, she had a temperature of 100.4, tachycardia, WBC 20.3 and a CT scan showing acute biliary obstruction due to stone. She was seen by Dr. Fuller Plan GI service and taken to the ENDO suite where she had ERCP, this showed a Large periampullary diverticulum, biliary sphincterotomy was performed, she had 2 large stones in the mid CBD, with balloon extraction, and mid biliary tree dilatation.  Post procedure she has done well, she has advanced to a regular diet, she does have a positive blood culture with final results pending. She is on Day 4 of Zosyn, treatment.  No significant growth on urine culture and no final on the blood culture results at this time.  She does not have an abdominal US, since 2010 that I can see.  WBC is back to normal ,and LFT's are trending down with good improvement. We are ask to see for possible cholecystectomy.     Past Medical History  Diagnosis Date  HLD (hyperlipidemia)   HTN (hypertension)   AAA (abdominal aortic aneurysm)/PVD with Left femoral bypass. 2011   Dr Early repaired this 2011  Cirrhosis, biliary    liver bx 01/2008  OSA (obstructive sleep apnea) she was tested and told she did not have this.   Hx of tobacco use, less than 1PPD 60 years, quit 2011   Pneumonia   Bleeding ulcer 1980s   H. Pylori  Acute ischemic colitis 03/06/2012  Anemia   Arthritis    bilateral hands and knees  Cataract    In the past  Diastolic CHF (2DECHO 7/61/95 EF 09-32%, Grade 1 Diastolic dysfunction, Mild AR, mitral calcification, and Pulmonary pressure of 40 mm/hg. 04/20/2013    Past Surgical History  Procedure Laterality Date  . 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 Cataract extraction    . Breast biopsy    . Colonoscopy  03/08/2012    Procedure: COLONOSCOPY;   Surgeon: Gatha Mayer, MD;  Location: [facility];  Service: Endoscopy;  Laterality: N/A;  . Abdominal aortic aneurysm repair  02/01/2010  . Ercp N/A 04/20/2013    Procedure: ENDOSCOPIC RETROGRADE CHOLANGIOPANCREATOGRAPHY (ERCP);  Surgeon: Ladene Artist, MD;  Location: [facility];  Service: Endoscopy;  Laterality: N/A;    History reviewed. No pertinent family history.  Social History:  reports that she quit smoking about 3 years ago. Her smoking use included Cigarettes. She smoked 0.00 packs per day for 52 years. She does not have any smokeless tobacco history on file. She reports that she does not drink alcohol or use illicit drugs.  Allergies: No Known Allergies  Medications:  Prior to Admission:  Prescriptions prior to admission  Medication Sig Dispense Refill  . amLODipine (NORVASC) 10 MG tablet Take 10 mg by mouth every morning.       . Ascorbic Acid (VITAMIN C) 500 MG tablet Take 500 mg by mouth every morning.       Marland Kitchen aspirin EC 81 MG tablet Take 81 mg by mouth every morning.      . B Complex-C (B-COMPLEX WITH VITAMIN C) tablet Take 1 tablet by mouth every morning.      . cholecalciferol (VITAMIN D) 1000 UNITS tablet Take 1,000 Units by mouth 2 (two) times daily.      Marland Kitchen co-enzyme Q-10  30 MG capsule Take 30 mg by mouth daily.       . diclofenac sodium (VOLTAREN) 1 % GEL Apply 2 g topically 3 (three) times daily as needed (knee pain and swelling).      . fenofibrate 160 MG tablet Take 160 mg by mouth at bedtime.      . fish oil-omega-3 fatty acids 1000 MG capsule Take 2 g by mouth every morning.       . Flaxseed, Linseed, (FLAXSEED OIL PO) Take 1 capsule by mouth every morning.       . folic acid (FOLVITE) 222 MCG tablet Take 400 mcg by mouth every morning.       . Nutritional Supplements (OSTEO ADVANCE) TABS Take 1 tablet by mouth every morning.       . simvastatin (ZOCOR) 40 MG tablet Take 1 tablet (40 mg total) by mouth at bedtime.  90 tablet  3  . Thiamine HCl (VITAMIN B-1) 100  MG tablet Take 100 mg by mouth every morning.       Marland Kitchen VITAMIN A PO Take 1 tablet by mouth every morning.      . vitamin B-12 (CYANOCOBALAMIN) 1000 MCG tablet Take 1,000 mcg by mouth every morning.       . vitamin E 400 UNIT capsule Take 400 Units by mouth every morning.        Scheduled: . fenofibrate  160 mg Oral QHS  . lisinopril  5 mg Oral Daily  . piperacillin-tazobactam (ZOSYN)  IV  3.375 g Intravenous Q8H  . simvastatin  40 mg Oral QHS   Continuous: . sodium chloride 50 mL/hr at 04/21/13 1626   LNL:[id number] Anti-infectives   Start     Dose/Rate Route Frequency Ordered Stop   04/20/13 0900  piperacillin-tazobactam (ZOSYN) IVPB 3.375 g     3.375 g 12.5 mL/hr over 240 Minutes Intravenous Every 8 hours 04/20/13 0837     04/20/13 0415  piperacillin-tazobactam (ZOSYN) IVPB 3.375 g     3.375 g 100 mL/hr over 30 Minutes Intravenous  Once 04/20/13 0409 04/20/13 4481      Results for orders placed during the hospital encounter of 04/20/13 (from the past 48 hour(s))  URINALYSIS, ROUTINE W REFLEX MICROSCOPIC     Status: Abnormal   Collection Time    04/20/13  3:15 PM      Result Value Range   Color, Urine ORANGE (*) YELLOW   Comment: BIOCHEMICALS MAY BE AFFECTED BY COLOR   APPearance CLOUDY (*) CLEAR   Specific Gravity, Urine >1.046 (*) 1.005 - 1.030   pH 5.5  5.0 - 8.0   Glucose, UA NEGATIVE  NEGATIVE mg/dL   Hgb urine dipstick TRACE (*) NEGATIVE   Bilirubin Urine LARGE (*) NEGATIVE   Ketones, ur NEGATIVE  NEGATIVE mg/dL   Protein, ur NEGATIVE  NEGATIVE mg/dL   Urobilinogen, UA 2.0 (*) 0.0 - 1.0 mg/dL   Nitrite POSITIVE (*) NEGATIVE   Leukocytes, UA MODERATE (*) NEGATIVE  URINE MICROSCOPIC-ADD ON     Status: Abnormal   Collection Time    04/20/13  3:15 PM      Result Value Range   Squamous Epithelial / LPF RARE  RARE   WBC, UA 11-20  <3 WBC/hpf   RBC / HPF 0-2  <3 RBC/hpf   Bacteria, UA MANY (*) RARE  URINE CULTURE     Status: None   Collection Time    04/20/13   3:15 PM      Result Value Range  Specimen Description URINE, CLEAN CATCH     Special Requests NONE     Culture  Setup Time       Value: 04/20/2013 22:44     Performed at [facility]       Value: 3,000 COLONIES/ML     Performed at [facility]   Culture       Value: INSIGNIFICANT GROWTH     Performed at [facility]   Report Status 04/21/2013 FINAL    COMPREHENSIVE METABOLIC PANEL     Status: Abnormal   Collection Time    04/21/13  5:00 AM      Result Value Range   Sodium 137  137 - 147 mEq/L   Potassium 3.6 (*) 3.7 - 5.3 mEq/L   Chloride 101  96 - 112 mEq/L   CO2 22  19 - 32 mEq/L   Glucose, Bld 96  70 - 99 mg/dL   BUN 12  6 - 23 mg/dL   Creatinine, Ser 0.90  0.50 - 1.10 mg/dL   Calcium 8.8  8.4 - 10.5 mg/dL   Total Protein 6.4  6.0 - 8.3 g/dL   Albumin 2.4 (*) 3.5 - 5.2 g/dL   AST 80 (*) 0 - 37 U/L   ALT 62 (*) 0 - 35 U/L   Alkaline Phosphatase 289 (*) 39 - 117 U/L   Total Bilirubin 8.2 (*) 0.3 - 1.2 mg/dL   GFR calc non Af Amer 58 (*) >90 mL/min   GFR calc Af Amer 68 (*) >90 mL/min   Comment: (NOTE)     The eGFR has been calculated using the CKD EPI equation.     This calculation has not been validated in all clinical situations.     eGFR's persistently <90 mL/min signify possible Chronic Kidney     Disease.  CBC WITH DIFFERENTIAL     Status: Abnormal   Collection Time    04/21/13  5:00 AM      Result Value Range   WBC 11.3 (*) 4.0 - 10.5 K/uL   RBC 4.30  3.87 - 5.11 MIL/uL   Hemoglobin 12.2  12.0 - 15.0 g/dL   Comment: DELTA CHECK NOTED     REPEATED TO VERIFY   HCT 37.9  36.0 - 46.0 %   MCV 88.1  78.0 - 100.0 fL   MCH 28.4  26.0 - 34.0 pg   MCHC 32.2  30.0 - 36.0 g/dL   RDW 14.2  11.5 - 15.5 %   Platelets 242  150 - 400 K/uL   Neutrophils Relative % 76  43 - 77 %   Neutro Abs 8.7 (*) 1.7 - 7.7 K/uL   Lymphocytes Relative 16  12 - 46 %   Lymphs Abs 1.8  0.7 - 4.0 K/uL   Monocytes Relative 8  3 - 12 %   Monocytes  Absolute 0.9  0.1 - 1.0 K/uL   Eosinophils Relative 0  0 - 5 %   Eosinophils Absolute 0.0  0.0 - 0.7 K/uL   Basophils Relative 0  0 - 1 %   Basophils Absolute 0.0  0.0 - 0.1 K/uL  HEPATIC FUNCTION PANEL     Status: Abnormal   Collection Time    04/22/13  4:56 AM      Result Value Range   Total Protein 6.5  6.0 - 8.3 g/dL   Albumin 2.4 (*) 3.5 - 5.2 g/dL   AST 42 (*) 0 - 37  U/L   ALT 45 (*) 0 - 35 U/L   Alkaline Phosphatase 283 (*) 39 - 117 U/L   Total Bilirubin 3.3 (*) 0.3 - 1.2 mg/dL   Bilirubin, Direct 2.1 (*) 0.0 - 0.3 mg/dL   Indirect Bilirubin 1.2 (*) 0.3 - 0.9 mg/dL  CBC WITH DIFFERENTIAL     Status: None   Collection Time    04/22/13  9:10 AM      Result Value Range   WBC 7.4  4.0 - 10.5 K/uL   RBC 4.28  3.87 - 5.11 MIL/uL   Hemoglobin 12.2  12.0 - 15.0 g/dL   HCT 38.0  36.0 - 46.0 %   MCV 88.8  78.0 - 100.0 fL   MCH 28.5  26.0 - 34.0 pg   MCHC 32.1  30.0 - 36.0 g/dL   RDW 13.9  11.5 - 15.5 %   Platelets 248  150 - 400 K/uL   Neutrophils Relative % 64  43 - 77 %   Neutro Abs 4.7  1.7 - 7.7 K/uL   Lymphocytes Relative 28  12 - 46 %   Lymphs Abs 2.1  0.7 - 4.0 K/uL   Monocytes Relative 7  3 - 12 %   Monocytes Absolute 0.6  0.1 - 1.0 K/uL   Eosinophils Relative 0  0 - 5 %   Eosinophils Absolute 0.0  0.0 - 0.7 K/uL   Basophils Relative 0  0 - 1 %   Basophils Absolute 0.0  0.0 - 0.1 K/uL  COMPREHENSIVE METABOLIC PANEL     Status: Abnormal   Collection Time    04/22/13  9:10 AM      Result Value Range   Sodium 139  137 - 147 mEq/L   Potassium 3.2 (*) 3.7 - 5.3 mEq/L   Chloride 101  96 - 112 mEq/L   CO2 24  19 - 32 mEq/L   Glucose, Bld 113 (*) 70 - 99 mg/dL   BUN 11  6 - 23 mg/dL   Creatinine, Ser 0.90  0.50 - 1.10 mg/dL   Calcium 9.0  8.4 - 10.5 mg/dL   Total Protein 6.5  6.0 - 8.3 g/dL   Albumin 2.4 (*) 3.5 - 5.2 g/dL   AST 40 (*) 0 - 37 U/L   ALT 45 (*) 0 - 35 U/L   Alkaline Phosphatase 283 (*) 39 - 117 U/L   Total Bilirubin 3.1 (*) 0.3 - 1.2 mg/dL   GFR  calc non Af Amer 58 (*) >90 mL/min   GFR calc Af Amer 68 (*) >90 mL/min   Comment: (NOTE)     The eGFR has been calculated using the CKD EPI equation.     This calculation has not been validated in all clinical situations.     eGFR's persistently <90 mL/min signify possible Chronic Kidney     Disease.    Dg Ercp Biliary & Pancreatic Ducts  04/20/2013   CLINICAL DATA:  Common bile duct stones.  EXAM: ERCP  TECHNIQUE: Multiple spot images obtained with the fluoroscopic device and submitted for interpretation post-procedure.  COMPARISON:  CT 04/20/2013  FINDINGS: The common bile duct was cannulated and opacified. Common bile duct is dilated. Filling defects in the common bile duct are compatible with stones. A balloon sweep was performed for stone removal. Cannot exclude residual filling defects in the common bile duct at the end of the procedure. Minor filling of the intrahepatic bile ducts.  IMPRESSION: Removal of biliary stones.  These  images were submitted for radiologic interpretation only. Please see the procedural report for the amount of contrast and the fluoroscopy time utilized.   Electronically Signed   By: Markus Daft M.D.   On: 04/20/2013 17:10    Review of Systems  Constitutional: Negative.   HENT: Negative.   Eyes: Negative.   Respiratory: Negative.   Cardiovascular: Positive for leg swelling. Palpitations: occasional swelling if she sits to long.  Gastrointestinal: Positive for vomiting. Negative for heartburn, nausea, abdominal pain, diarrhea, constipation, blood in stool and melena.  Genitourinary: Negative.   Musculoskeletal: Negative.   Skin: Negative.   Neurological: Negative.   Psychiatric/Behavioral: Negative.    Blood pressure 150/66, pulse 67, temperature 97.8 F (36.6 C), temperature source Oral, resp. rate 20, height '5\' 8"'  (1.727 m), weight 100.245 kg (221 lb), SpO2 94.00%. Physical Exam  Constitutional: She is oriented to person, place, and time. She appears  well-developed and well-nourished. No distress.  Body mass index is 33.61 kg/(m^2).   HENT:  Head: Normocephalic and atraumatic.  Nose: Nose normal.  Eyes: Conjunctivae are normal. Pupils are equal, round, and reactive to light. Right eye exhibits no discharge. Left eye exhibits no discharge. No scleral icterus.  Neck: Normal range of motion. Neck supple. No JVD present. No tracheal deviation present. No thyromegaly present.  Cardiovascular: Normal rate, regular rhythm, normal heart sounds and intact distal pulses.  Exam reveals no gallop.   No murmur heard. Respiratory: Effort normal and breath sounds normal. No respiratory distress. She has no wheezes. She has no rales. She exhibits no tenderness.  GI: Soft. Bowel sounds are normal. She exhibits no distension and no mass. There is no tenderness. There is no rebound and no guarding.  Midline surgical scar.  Musculoskeletal: She exhibits no edema and no tenderness.  Lymphadenopathy:    She has no cervical adenopathy.  Neurological: She is alert and oriented to person, place, and time. No cranial nerve deficit.  Skin: Skin is warm and dry. She is not diaphoretic.  Psychiatric: She has a normal mood and affect. Her behavior is normal. Judgment and thought content normal.    Assessment/Plan: 1.  Cholangitis with CBD stones/s/p ERCP 04/20/13.  1a.  Bacteremia with 1 blood culture pending, with GM neg rods final culture result pending.  (day 4 of Zosyn.) 2.  Hypertension 3.  Hx of tobacco use 4.  S/P AAA repair, and femoral aneurysm repair, 2011 Dr. Donnetta Hutching 5.  Hx of biliary cirrhosis on liver Bx 01/2008.   Hx of DCHF (2d ECHO on 04/20/13, shows grade 1 diastolic dysfunction, EF 42-59%, Mild AR, and Mitral calcification. 6.   Arthritis 7.  Anemia  Plan:  Dr. Dalbert Batman will see later today.  We should complete antibiotic treatment for bacteremia.  I will order an abdominal US to have prior to D/C.  She can follow up in our office. I have an  appointment for her to see Dr. Dalbert Batman on 05/06/13 at 9:45 AM.  Earnstine Regal 04/22/2013, 12:44 PM

## 2013-04-22 NOTE — Consult Note (Signed)
General surgery attending note:  I personally interviewed and examined this patient this afternoon. I agree with the assessment and treatment plan outlined by Mr. Creig Hines, [place].  She has done very well following her ERCP and sphincterotomy. Her cholangitis has resolved. She has no clinical or radiographic evidence of acute cholecystitis. Her abdomen is soft and nontender.  Assessment/plan: Acute cholangitis and gram-negative bacteremia secondary to choledocholithiasis. Cholangitis resolving following ERCP, sphincterotomy, and antibiotic treatment  No evidence for acute cholecystitis  History of an abdominal aortic aneurysm repair and left femoral bypass.  Hypertension  Diastolic CHF with preserved ejection fraction  Essentially she appears to be doing well medically and her performance status is fairly good considering her age. She is a candidate for elective laparoscopic cholecystectomy. I discussed this with her. Advised 10-14 day course of antibiotics because of gram-negative bacteremia Followup with me in the office and we will discuss elective cholecystectomy at that time. She states that she may or may not be willing to have that done.  If she ultimately consents to elective laparoscopic cholecystectomy, it would be a good idea to get a cardiology risk assessment preop.  She will need to have a gallbladder ultrasound at some point.   Edsel Petrin. Dalbert Batman, M.D., [facility] General and Minimally invasive Surgery Breast and Colorectal Surgery Office:   [phone] Pager:   [phone]

## 2013-04-22 NOTE — Progress Notes (Signed)
[facility] PROGRESS NOTE  Deanna [name] MWN:[id number] DOB: April 27, 1929 DOA: 04/20/2013 PCP: Garnet Koyanagi, DO  Assessment/Plan:  Sepsis due to acute cholangitis  - S/P ERCP with stone extraction on 04/20/2013 - Continue Zosyn. -Patient's blood culture positive for gram-negative rods. Counseled patient and daughter that most likely patient will have to have antibiotic for 4-6 weeks, dependent on organism -blood cultures; speciation/sensitivity pending -phone consult with Dr. Carlyle Basques (ID): Continue current treatment, await sensitivities of culture .  Acute biliary obstruction  -See sepsis due to acute cholangitis  Bacteremia -Gram-negative rods (no speciation or sensitivities 1/19) -Continue Zosyn .  Diastolic CHF with LVH  -Counseled patient and family that this could be partially responsible for her Acute on chronic dyspnea on exertion. However secondary to patient's low BP would not be able start her on ACE inhibitor, or beta blocker at this time. -After patient's BP improves following treatment of sepsis will start patient on lisinopril.  Pulmonary hypertension -See diastolic CHF  HTN -start lisinopril 5 mg daily  HLD  - continue home medications   PBC  - followed by GI as an outpatient.   Elevated LFTs  - due to #1,2,6 -Monitor closely  Leukocytosis -See sepsis     Code Status: DNR Family Communication: no family present Disposition Plan: Per GI   Consultants: Dr. Lucio Edward  Procedures: Echocardiogram 04/20/2013 - Left ventricle: Wall thickness was increased mild to moderate LVH.  -LVEF= 60%- 65%.  -(grade 1 diastolic dysfunction). - Aortic valve: Trileaflet; normal thickness, mildly calcified leaflets. Mild regurgitation. - Mitral valve: Mildly to moderately calcified annulus. Moderately calcified leaflets posterior. - Right atrium: The atrium was mildly dilated. - Pulmonary arteries: Systolic pressure was mildly increased. PA peak  pressure: 69mm Hg (S).     04/20/2013 ERCP performed by Dr. Lucio Edward  1. Large periampullary diverticulum  2. Biliary sphincterotomy performed  3. 2 large stones in the mid common bile duct; balloon stone  extraction balloon performed  4. Mild biliary tree dilation   Antibiotics: Zosyn 1/17>>   HPI/Subjective: Deanna [name] is a 80 y.o.WF PMHx PBC, HTN, AAA s/p repair, HLD, OSA, bleeding ulcer (H. pyloric positive), ischemic colitis, presented to the ED with a chief complaint of severe nausea and vomiting for the past 24-26 hours, worsening this morning. She also endorses abdominal pain; she is being followed at [facility] for her [place] and this seems to be stable. She denies chest pain. She endorses shortness of breath with exertion, gradually worsening over the past 2 months. No chest pain with exertion. No orthopnea or PND. Does have intermittent bilateral LE swelling and thinks that she has gaines 20 lbs in the past 2 months. No leg swelling today, she feels dehydrated since she is unable to eat and has multiple emesis episodes. No lightheadedness or dizziness. No weakness. In the ED febrile to 100.4, tachycardic up to 103, WBC 20.3 and CT abdomen pelvis with acute biliary obstruction in CHD due to stones. GI has been consulted by EDP. 04/21/2013 patient sitting in bed comfortably, negative N./V., negative abdominal pain, negative SOB 1/19/2015patient resting comfortably, negative N./V., negative abdominal pain.   Objective: Filed Vitals:   04/21/13 0638 04/21/13 1357 04/21/13 2126 04/22/13 0455  BP: 116/67 97/60 146/73 150/66  Pulse: 73 81 79 67  Temp: 98.2 F (36.8 C) 97.9 F (36.6 C) 98.3 F (36.8 C) 97.8 F (36.6 C)  TempSrc: Oral Oral Oral Oral  Resp: 16 16 16 20   Height:  Weight:      SpO2: 93% 93% 95% 94%    Intake/Output Summary (Last 24 hours) at 04/22/13 0747 Last data filed at 04/21/13 2310  Gross per 24 hour  Intake    772 ml  Output      0 ml  Net    772 ml    Filed Weights   04/20/13 0815  Weight: 100.245 kg (221 lb)    Exam:   General:  A./O. x4, NAD  Cardiovascular: Regular rhythm and rate, negative murmurs rubs or gallops  Respiratory: Clear to auscultation bilateral  Abdomen: Soft, nontender, and nondistended, plus bowel sounds, large vertical midline well-healed surgical incision consistent with repair of AAA  Musculoskeletal: Negative pedal edema   Data Reviewed: Basic Metabolic Panel:  Recent Labs Lab 04/20/13 0256 04/21/13 0500  NA 135* 137  K 4.3 3.6*  CL 97 101  CO2 19 22  GLUCOSE 134* 96  BUN 16 12  CREATININE 0.87 0.90  CALCIUM 9.5 8.8   Liver Function Tests:  Recent Labs Lab 04/20/13 0256 04/21/13 0500 04/22/13 0456  AST 116* 80* 42*  ALT 76* 62* 45*  ALKPHOS 332* 289* 283*  BILITOT 6.1* 8.2* 3.3*  PROT 7.6 6.4 6.5  ALBUMIN 3.0* 2.4* 2.4*    Recent Labs Lab 04/20/13 0256  LIPASE 11   No results found for this basename: AMMONIA,  in the last 168 hours CBC:  Recent Labs Lab 04/20/13 0256 04/21/13 0500  WBC 20.3* 11.3*  NEUTROABS 17.7* 8.7*  HGB 9.7* 12.2  HCT 41.5 37.9  MCV 87.2 88.1  PLT PLATELET CLUMPS NOTED ON SMEAR, COUNT APPEARS ADEQUATE 242   Cardiac Enzymes: No results found for this basename: CKTOTAL, CKMB, CKMBINDEX, TROPONINI,  in the last 168 hours BNP (last 3 results) No results found for this basename: PROBNP,  in the last 8760 hours CBG: No results found for this basename: GLUCAP,  in the last 168 hours  Recent Results (from the past 240 hour(s))  CULTURE, BLOOD (ROUTINE X 2)     Status: None   Collection Time    04/20/13  4:55 AM      Result Value Range Status   Specimen Description BLOOD RIGHT HAND   Final   Special Requests BOTTLES DRAWN AEROBIC AND ANAEROBIC 5CC   Final   Culture  Setup Time     Final   Value: 04/20/2013 11:15     Performed at [facility]   Culture     Final   Value:        BLOOD CULTURE RECEIVED NO GROWTH TO DATE CULTURE WILL  BE HELD FOR 5 DAYS BEFORE ISSUING A FINAL NEGATIVE REPORT     Performed at [facility]   Report Status PENDING   Incomplete  CULTURE, BLOOD (ROUTINE X 2)     Status: None   Collection Time    04/20/13  5:04 AM      Result Value Range Status   Specimen Description BLOOD LEFT HAND   Final   Special Requests BOTTLES DRAWN AEROBIC AND ANAEROBIC 5CC   Final   Culture  Setup Time     Final   Value: 04/20/2013 11:15     Performed at [facility]   Culture     Final   Value: GRAM NEGATIVE RODS     Note: Gram Stain Report Called to,Read Back By and Verified With: JENNIFER MALMSELT 04/21/2013 0015AM [facility]CV     Performed at [facility]  Report Status PENDING   Incomplete  URINE CULTURE     Status: None   Collection Time    04/20/13  3:15 PM      Result Value Range Status   Specimen Description URINE, CLEAN CATCH   Final   Special Requests NONE   Final   Culture  Setup Time     Final   Value: 04/20/2013 22:44     Performed at [place]     Final   Value: 3,000 COLONIES/ML     Performed at [facility]   Culture     Final   Value: INSIGNIFICANT GROWTH     Performed at [facility]   Report Status 04/21/2013 FINAL   Final     Studies: Dg Ercp Biliary & Pancreatic Ducts  04/20/2013   CLINICAL DATA:  Common bile duct stones.  EXAM: ERCP  TECHNIQUE: Multiple spot images obtained with the fluoroscopic device and submitted for interpretation post-procedure.  COMPARISON:  CT 04/20/2013  FINDINGS: The common bile duct was cannulated and opacified. Common bile duct is dilated. Filling defects in the common bile duct are compatible with stones. A balloon sweep was performed for stone removal. Cannot exclude residual filling defects in the common bile duct at the end of the procedure. Minor filling of the intrahepatic bile ducts.  IMPRESSION: Removal of biliary stones.  These images were submitted for radiologic interpretation only.  Please see the procedural report for the amount of contrast and the fluoroscopy time utilized.   Electronically Signed   By: Markus Daft M.D.   On: 04/20/2013 17:10    Scheduled Meds: . fenofibrate  160 mg Oral QHS  . piperacillin-tazobactam (ZOSYN)  IV  3.375 g Intravenous Q8H  . simvastatin  40 mg Oral QHS   Continuous Infusions: . sodium chloride 50 mL/hr at 04/21/13 1626    Principal Problem:   Acute cholangitis Active Problems:   HYPERLIPIDEMIA   HYPERTENSION   BILIARY CIRRHOSIS, PRIMARY   Leukocytosis   Dyspnea on exertion   Calculus of bile duct without mention of cholecystitis or obstruction   Nonspecific (abnormal) findings on radiological and other examination of biliary tract   Pulmonary hypertension   Diastolic CHF    Time spent: 40 minutes   WOODS, CURTIS, J  [facility] Pager [phone]. If 7PM-7AM, please contact night-coverage at [email or web address], password [facility] 04/22/2013, 7:47 AM  LOS: 2 days

## 2013-04-22 NOTE — Progress Notes (Signed)
Patient questioning when the Infectious Disease MD would be rounding on her.  I notified Dr. Sherral Hammers and he stated that he spoke to ID via phone consult and that there would not be an ID MD rounding.  Also, patient due for Abdominal Ultrasound.  I notified us department and they will have to do pt's ultrasound in the am and patient is to be NPO after midnight.  Patient and her son informed of the above information.  Deanna [name] [facility]  04/22/2013  5:55 PM

## 2013-04-22 NOTE — Progress Notes (Signed)
    Progress Note   Subjective  feels okay, no abdominal pain   Objective   Vital signs in last 24 hours: Temp:  [97.8 F (36.6 C)-98.3 F (36.8 C)] 97.8 F (36.6 C) (01/19 0455) Pulse Rate:  [67-81] 67 (01/19 0455) Resp:  [16-20] 20 (01/19 0455) BP: (97-150)/(60-73) 150/66 mmHg (01/19 0455) SpO2:  [93 %-95 %] 94 % (01/19 0455) Last BM Date: 04/21/13 General:    white female in NAD Abdomen:  Soft, nontender and nondistended. Normal bowel sounds. Neurologic:  Alert and oriented,  grossly normal neurologically. Psych:  Cooperative. Normal mood and affect.    Lab Results:  Recent Labs  04/20/13 0256 04/21/13 0500 04/22/13 0910  WBC 20.3* 11.3* 7.4  HGB 9.7* 12.2 12.2  HCT 41.5 37.9 38.0  PLT PLATELET CLUMPS NOTED ON SMEAR, COUNT APPEARS ADEQUATE 242 248   BMET  Recent Labs  04/20/13 0256 04/21/13 0500  NA 135* 137  K 4.3 3.6*  CL 97 101  CO2 19 22  GLUCOSE 134* 96  BUN 16 12  CREATININE 0.87 0.90  CALCIUM 9.5 8.8   LFT  Recent Labs  04/22/13 0456  PROT 6.5  ALBUMIN 2.4*  AST 42*  ALT 45*  ALKPHOS 283*  BILITOT 3.3*  BILIDIR 2.1*  IBILI 1.2*   PT/INR  Recent Labs  04/20/13 1051  LABPROT 14.5  INR 1.15    Studies/Results: Dg Ercp Biliary & Pancreatic Ducts  04/20/2013   CLINICAL DATA:  Common bile duct stones.  EXAM: ERCP  TECHNIQUE: Multiple spot images obtained with the fluoroscopic device and submitted for interpretation post-procedure.  COMPARISON:  CT 04/20/2013  FINDINGS: The common bile duct was cannulated and opacified. Common bile duct is dilated. Filling defects in the common bile duct are compatible with stones. A balloon sweep was performed for stone removal. Cannot exclude residual filling defects in the common bile duct at the end of the procedure. Minor filling of the intrahepatic bile ducts.  IMPRESSION: Removal of biliary stones.  These images were submitted for radiologic interpretation only. Please see the procedural report for  the amount of contrast and the fluoroscopy time utilized.   Electronically Signed   By: Markus Daft M.D.   On: 04/20/2013 17:10     Assessment / Plan:   Choledocholithiasis with cholangitis / gram negative rod bacteremia. Blood cultures still pending. On day #3 of Zosyn. Patient is s/p ERCP with sphincterotomy and stone extraction two days ago. LFTs continue to improve, bili down from 8 to 3. WBC down from 20 on admission to 7.  No cholelithiasis on imaging studies but still needs surgical consult for consideration of cholecystectomy. Hopefully home tomorrow on cipro / flagyl (for 7 total days of antibiotics)  2. PBC, stable.     LOS: 2 days   Deanna [name]  04/22/2013, 9:45 AM  Attending MD note:   I have taken a history,and reviewed the chart. I agree with the Advanced Practitioner's impression and recommendations. Suggest surgical consult before discharge for consideration of laparoscopic cholecystectomy  [facility] Pager # [phone]

## 2013-04-23 ENCOUNTER — Inpatient Hospital Stay (HOSPITAL_COMMUNITY): Payer: Medicare Other

## 2013-04-23 DIAGNOSIS — K831 Obstruction of bile duct: Secondary | ICD-10-CM

## 2013-04-23 DIAGNOSIS — R7881 Bacteremia: Secondary | ICD-10-CM

## 2013-04-23 LAB — CULTURE, BLOOD (ROUTINE X 2)

## 2013-04-23 MED ORDER — LISINOPRIL 5 MG PO TABS: 5.0000 mg | ORAL_TABLET | Freq: Every day | ORAL | Status: DC

## 2013-04-23 MED ORDER — CIPROFLOXACIN HCL 500 MG PO TABS
500.0000 mg | ORAL_TABLET | Freq: Two times a day (BID) | ORAL | Status: DC
Start: 2013-04-23 — End: 2013-04-23
  Administered 2013-04-23: 500 mg via ORAL
  Filled 2013-04-23 (×3): qty 1

## 2013-04-23 MED ORDER — CIPROFLOXACIN HCL 500 MG PO TABS
500.0000 mg | ORAL_TABLET | Freq: Two times a day (BID) | ORAL | Status: DC
Start: 2013-04-23 — End: 2013-05-10

## 2013-04-23 NOTE — Progress Notes (Signed)
    Progress Note   Subjective  no abdominal pain. Hungry   Objective   Vital signs in last 24 hours: Temp:  [97.7 F (36.5 C)-98.3 F (36.8 C)] 98.3 F (36.8 C) (01/20 0657) Pulse Rate:  [64-79] 64 (01/20 0941) Resp:  [18-20] 18 (01/20 0657) BP: (115-156)/(60-76) 129/76 mmHg (01/20 0941) SpO2:  [92 %-96 %] 96 % (01/20 0657) Last BM Date: 04/22/13 General:    pleasant white female in NAD Abdomen:  Soft, nontender and nondistended. Normal bowel sounds. Neurologic:  Alert and oriented,  grossly normal neurologically. Psych:  Cooperative. Normal mood and affect.  Lab Results:  Recent Labs  04/21/13 0500 04/22/13 0910  WBC 11.3* 7.4  HGB 12.2 12.2  HCT 37.9 38.0  PLT 242 248   BMET  Recent Labs  04/21/13 0500 04/22/13 0910  NA 137 139  K 3.6* 3.2*  CL 101 101  CO2 22 24  GLUCOSE 96 113*  BUN 12 11  CREATININE 0.90 0.90  CALCIUM 8.8 9.0   LFT  Recent Labs  04/22/13 0456 04/22/13 0910  PROT 6.5 6.5  ALBUMIN 2.4* 2.4*  AST 42* 40*  ALT 45* 45*  ALKPHOS 283* 283*  BILITOT 3.3* 3.1*  BILIDIR 2.1*  --   IBILI 1.2*  --      Assessment / Plan:   1. Choledocholithiasis with cholangitis, s/p ERCP with sphincterotomy and stone extraction three days ago. On day #4 of Zosyn.  Blood cultures positive for E.coli. Will transition to oral Cipro.  No cholelithiasis on imaging studies but pursuing cholecystectomy at some point is not unreasonable. Surgery has seen and will evaluate her again outpatient to discuss elective cholecystectomy. She is getting ultrasound (per surgery's request)  today Home on Cipro, maybe later today?   2. PBC, stable   LOS: 3 days   Tye Savoy  04/23/2013, 9:57 AM  Attending MD note:   I have taken a history, examined the patient, and reviewed the chart. I agree with the Advanced Practitioner's impression and recommendations. Ultrasound of the gall bladder just completed- shows sludge in the gall bladder, mobile, no definite stones.  Home on Cipro.  [facility] Pager # [phone]

## 2013-04-23 NOTE — Progress Notes (Signed)
Patient d/c instructions given to patient, verbalized understanding. Stable. No c/o pain - Sandie Ano RN

## 2013-04-23 NOTE — Discharge Summary (Signed)
Physician Discharge Summary  Rasheda [place] [id number] DOB: 26-Mar-1930 DOA: 04/20/2013  PCP: Garnet Koyanagi, DO  Admit date: 04/20/2013 Discharge date: 04/23/2013  Time spent:45 minutes  Recommendations for Outpatient Follow-up:   Sepsis due to acute cholangitis  - S/P ERCP with stone extraction on 04/20/2013  - Continue Zosyn.  -1/19 phone consult with Dr. Carlyle Basques (ID): Continue current treatment, await sensitivities of culture. State usual course of treatment would be 2 weeks for bacteremia  -1/20 Patient's blood culture positive Escherichia coli susceptible to ciprofloxacin. Counseled patient patient will complete 2 weeks antibiotic.   Bacteremia -Escherichia coli susceptible to ciprofloxacin -Discharge on 2 weeks ciprofloxacin .  Acute biliary obstruction  -See sepsis due to acute cholangitis  -Per surgery patient to receive abdominal ultrasound prior to discharge today my: Evaluation for future cholecystectomy -Followup in 2 weeks Dr. Fanny Skates, for discussion of elective cholecystectomy  Bacteremia  -Gram-negative rods; Escherichia coli susceptible to ciprofloxacin  -DC  Zosyn;  Discharge on ciprofloxacin 500 mg BID   .  Diastolic CHF with LVH  -Counseled patient and family that this could be partially responsible for her Acute on chronic dyspnea on exertion.   -Patient's BP has improved and patient was started on lisinopril 5 mg daily on 1/19  -Currently BP within AHA guidelines -PCP to titrate medications to maximum therapeutic effect for CHF   Pulmonary hypertension  -See diastolic CHF   HTN  -Continue lisinopril 5 mg daily   HLD  - continue home medications   PBC  - followed by GI as an outpatient.   Elevated LFTs  - due to #1,2,6  -Monitor closely   Leukocytosis  -See sepsis      Discharge Diagnoses:  Principal Problem:   Acute cholangitis Active Problems:   HYPERLIPIDEMIA   HYPERTENSION   BILIARY CIRRHOSIS, PRIMARY   Leukocytosis    Dyspnea on exertion   Calculus of bile duct without mention of cholecystitis or obstruction   Nonspecific (abnormal) findings on radiological and other examination of biliary tract   Pulmonary hypertension   Diastolic CHF   Discharge Condition: Stable  Diet recommendation: Low-fat  Filed Weights   04/20/13 0815  Weight: 100.245 kg (221 lb)    History of present illness:  Deanna [name] is a 80 y.o.WF PMHx PBC, HTN, AAA s/p repair, HLD, OSA, bleeding ulcer (H. pyloric positive), ischemic colitis, presented to the ED with a chief complaint of severe nausea and vomiting for the past 24-26 hours, worsening this morning. She also endorses abdominal pain; she is being followed at [facility] for her [place] and this seems to be stable. She denies chest pain. She endorses shortness of breath with exertion, gradually worsening over the past 2 months. No chest pain with exertion. No orthopnea or PND. Does have intermittent bilateral LE swelling and thinks that she has gaines 20 lbs in the past 2 months. No leg swelling today, she feels dehydrated since she is unable to eat and has multiple emesis episodes. No lightheadedness or dizziness. No weakness. In the ED febrile to 100.4, tachycardic up to 103, WBC 20.3 and CT abdomen pelvis with acute biliary obstruction in CHD due to stones. On 1/17 patient seen by Dr. Lucio Edward (gastroenterology) performed ERCP with 2 large stones removed from mid common bile duct, and biliary sphincterectomy.  04/21/2013 patient sitting in bed comfortably, negative N./V., negative abdominal pain, negative SOB 1/19/2015patient resting comfortably, negative N./V., negative abdominal pain. 04/23/2013 no acute events overnight. Patient ready for discharge following abdominal  ultrasound    Consultants:  Dr. Lucio Edward    Procedures:   Echocardiogram 04/20/2013  - Left ventricle: Wall thickness was increased mild to moderate LVH.  -LVEF= 60%- 65%.  -(grade 1 diastolic  dysfunction). - Aortic valve: Trileaflet; normal thickness, mildly calcified leaflets. Mild regurgitation. - Mitral valve: Mildly to moderately calcified annulus. Moderately calcified leaflets posterior. - Right atrium: The atrium was mildly dilated. - Pulmonary arteries: Systolic pressure was mildly increased. PA peak pressure: 60mm Hg (S).  04/20/2013 ERCP performed by Dr. Lucio Edward  1. Large periampullary diverticulum  2. Biliary sphincterotomy performed  3. 2 large stones in the mid common bile duct; balloon stone  extraction balloon performed  4. Mild biliary tree dilation    Antibiotics:  Zosyn 1/17>> stopped 1/20 Ciprofloxacin 1/20>> stop 3 February     Discharge Exam: Filed Vitals:   04/22/13 1300 04/22/13 2115 04/23/13 0657 04/23/13 0941  BP: 115/60 152/60 156/70 129/76  Pulse: 79 76 66 64  Temp: 97.7 F (36.5 C) 97.9 F (36.6 C) 98.3 F (36.8 C)   TempSrc: Oral Oral Oral   Resp: 20 20 18    Height:      Weight:      SpO2: 93% 92% 96%    General: A./O. x4, NAD  Cardiovascular: Regular rhythm and rate, negative murmurs rubs or gallops  Respiratory: Clear to auscultation bilateral  Abdomen: Soft, nontender, and nondistended, plus bowel sounds, large vertical midline well-healed surgical incision consistent with repair of AAA  Musculoskeletal: Negative pedal edema   Discharge Instructions   Future Appointments Provider Department Dept Phone   05/06/2013 9:45 AM Adin Hector, MD [facility], [place] [phone]       Medication List    ASK your doctor about these medications       amLODipine 10 MG tablet  Commonly known as:  NORVASC  Take 10 mg by mouth every morning.     aspirin EC 81 MG tablet  Take 81 mg by mouth every morning.     B-complex with vitamin C tablet  Take 1 tablet by mouth every morning.     cholecalciferol 1000 UNITS tablet  Commonly known as:  VITAMIN D  Take 1,000 Units by mouth 2 (two) times daily.     co-enzyme  Q-10 30 MG capsule  Take 30 mg by mouth daily.     diclofenac sodium 1 % Gel  Commonly known as:  VOLTAREN  Apply 2 g topically 3 (three) times daily as needed (knee pain and swelling).     fenofibrate 160 MG tablet  Take 160 mg by mouth at bedtime.     fish oil-omega-3 fatty acids 1000 MG capsule  Take 2 g by mouth every morning.     FLAXSEED OIL PO  Take 1 capsule by mouth every morning.     folic acid 962 MCG tablet  Commonly known as:  FOLVITE  Take 400 mcg by mouth every morning.     OSTEO ADVANCE Tabs  Take 1 tablet by mouth every morning.     simvastatin 40 MG tablet  Commonly known as:  ZOCOR  Take 1 tablet (40 mg total) by mouth at bedtime.     thiamine 100 MG tablet  Commonly known as:  VITAMIN B-1  Take 100 mg by mouth every morning.     VITAMIN A PO  Take 1 tablet by mouth every morning.     vitamin B-12 1000 MCG tablet  Commonly known as:  CYANOCOBALAMIN  Take 1,000 mcg by mouth every morning.     vitamin C 500 MG tablet  Commonly known as:  ASCORBIC ACID  Take 500 mg by mouth every morning.     vitamin E 400 UNIT capsule  Take 400 Units by mouth every morning.       No Known Allergies     Follow-up Information   Follow up with Adin Hector, MD On 05/06/2013. (05/06/13 be at our office at 9:30 for check in and to see Dr. Dalbert Batman at 9:45 AM)    Specialty:  General Surgery   Contact information:   [place] [address] [phone]        The results of significant diagnostics from this hospitalization (including imaging, microbiology, ancillary and laboratory) are listed below for reference.    Significant Diagnostic Studies: Ct Abdomen Pelvis W Contrast  04/20/2013   CLINICAL DATA:  Abdominal pain and bloating. Vomiting. Generalized weakness. Leukocytosis.  EXAM: CT ABDOMEN AND PELVIS WITH CONTRAST  TECHNIQUE: Multidetector CT imaging of the abdomen and pelvis was performed using the standard protocol following  bolus administration of intravenous contrast.  CONTRAST:  32mL OMNIPAQUE IOHEXOL 300 MG/ML SOLN, 161mL OMNIPAQUE IOHEXOL 300 MG/ML SOLN  COMPARISON:  CT of the abdomen and pelvis from 03/08/2012  FINDINGS: Minimal right basilar atelectasis is noted.  A 1.9 cm hypodensity at the medial right hepatic lobe is only minimally changed in size from 2013, and is likely benign. There is mild prominence of the intrahepatic biliary ducts, with mild distention of the gallbladder. An apparent obstructing 1.7 cm stone is noted within the common hepatic duct, just proximal to the head of the pancreas, and an additional 1.0 cm stone is seen more distally, just proximal to the duodenal ampulla. There is mild associated gallbladder wall thickening, without significant soft tissue inflammation.  The pancreas and adrenal glands are unremarkable. The pancreatic duct is not well assessed, but no abnormal dilatation of the pancreatic duct is seen.  Scattered bilateral renal cysts are seen, measuring up to 7.8 cm on the right side. The kidneys are otherwise unremarkable in appearance. There is no evidence of hydronephrosis. No renal or ureteral stones are seen. No perinephric stranding is appreciated.  A mild broad-based anterior abdominal wall hernia is seen superior to the umbilicus, with partial herniation of the transverse colon.  No free fluid is identified. The small bowel is unremarkable in appearance. The stomach is within normal limits. No acute vascular abnormalities are seen.  There is aneurysmal dilatation of the abdominal aorta to 3.5 cm in AP dimension at the level of the renal arteries. An aortoiliac stent graft is noted. Diffuse associated calcifications are seen.  The appendix is normal in caliber and contains air, without evidence for appendicitis. The colon is otherwise unremarkable in appearance.  The bladder is mildly distended and grossly unremarkable. The uterus is within normal limits. The ovaries are relatively  symmetric. No suspicious adnexal masses are seen. No inguinal lymphadenopathy is seen.  No acute osseous abnormalities are identified. Vacuum phenomenon is noted along the lumbar spine, with disc space narrowing at L5-S1, and chronic mild compression deformity at L4.  IMPRESSION: 1. 1.7 cm and 1.0 cm stones noted within the common hepatic duct, just proximal to the head of the pancreas and at the duodenal ampulla, respectively, causing acute biliary obstruction. Associated mild prominence of the intrahepatic biliary ducts and mild gallbladder distention. Mild associated gallbladder wall thickening noted, without evidence for  cholecystitis. 2. Scattered bilateral renal cysts, measuring up to 7.8 cm. 3. Mild broad-based anterior abdominal wall hernia seen superior to the umbilicus, with partial herniation of the transverse colon. No evidence for obstruction or strangulation. 4. Aneurysmal dilatation of the abdominal aorta to 3.5 cm in AP dimension at the level of the renal arteries. Diffuse associated calcifications seen. Underlying aortoiliac stent graft noted. 5. 1.9 cm right hepatic cyst again noted.   Electronically Signed   By: Roanna Raider M.D.   On: 04/20/2013 06:03   Dg Ercp Biliary & Pancreatic Ducts  04/20/2013   CLINICAL DATA:  Common bile duct stones.  EXAM: ERCP  TECHNIQUE: Multiple spot images obtained with the fluoroscopic device and submitted for interpretation post-procedure.  COMPARISON:  CT 04/20/2013  FINDINGS: The common bile duct was cannulated and opacified. Common bile duct is dilated. Filling defects in the common bile duct are compatible with stones. A balloon sweep was performed for stone removal. Cannot exclude residual filling defects in the common bile duct at the end of the procedure. Minor filling of the intrahepatic bile ducts.  IMPRESSION: Removal of biliary stones.  These images were submitted for radiologic interpretation only. Please see the procedural report for the amount of  contrast and the fluoroscopy time utilized.   Electronically Signed   By: Richarda Overlie M.D.   On: 04/20/2013 17:10    Microbiology: Recent Results (from the past 240 hour(s))  CULTURE, BLOOD (ROUTINE X 2)     Status: None   Collection Time    04/20/13  4:55 AM      Result Value Range Status   Specimen Description BLOOD RIGHT HAND   Final   Special Requests BOTTLES DRAWN AEROBIC AND ANAEROBIC 5CC   Final   Culture  Setup Time     Final   Value: 04/20/2013 11:15     Performed at [facility]   Culture     Final   Value:        BLOOD CULTURE RECEIVED NO GROWTH TO DATE CULTURE WILL BE HELD FOR 5 DAYS BEFORE ISSUING A FINAL NEGATIVE REPORT     Performed at [facility]   Report Status PENDING   Incomplete  CULTURE, BLOOD (ROUTINE X 2)     Status: None   Collection Time    04/20/13  5:04 AM      Result Value Range Status   Specimen Description BLOOD LEFT HAND   Final   Special Requests BOTTLES DRAWN AEROBIC AND ANAEROBIC 5CC   Final   Culture  Setup Time     Final   Value: 04/20/2013 11:15     Performed at [facility]   Culture     Final   Value: ESCHERICHIA COLI     Note: Gram Stain Report Called to,Read Back By and Verified With: JENNIFER MALMSELT 04/21/2013 0015AM [facility]     Performed at [facility]   Report Status 04/23/2013 FINAL   Final   Organism ID, Bacteria ESCHERICHIA COLI   Final  URINE CULTURE     Status: None   Collection Time    04/20/13  3:15 PM      Result Value Range Status   Specimen Description URINE, CLEAN CATCH   Final   Special Requests NONE   Final   Culture  Setup Time     Final   Value: 04/20/2013 22:44     Performed at [facility] Count  Final   Value: 3,000 COLONIES/ML     Performed at [facility]   Culture     Final   Value: INSIGNIFICANT GROWTH     Performed at [facility]   Report Status 04/21/2013 FINAL   Final     Labs: Basic Metabolic Panel:  Recent Labs Lab  04/20/13 0256 04/21/13 0500 04/22/13 0910  NA 135* 137 139  K 4.3 3.6* 3.2*  CL 97 101 101  CO2 19 22 24   GLUCOSE 134* 96 113*  BUN 16 12 11   CREATININE 0.87 0.90 0.90  CALCIUM 9.5 8.8 9.0   Liver Function Tests:  Recent Labs Lab 04/20/13 0256 04/21/13 0500 04/22/13 0456 04/22/13 0910  AST 116* 80* 42* 40*  ALT 76* 62* 45* 45*  ALKPHOS 332* 289* 283* 283*  BILITOT 6.1* 8.2* 3.3* 3.1*  PROT 7.6 6.4 6.5 6.5  ALBUMIN 3.0* 2.4* 2.4* 2.4*    Recent Labs Lab 04/20/13 0256  LIPASE 11   No results found for this basename: AMMONIA,  in the last 168 hours CBC:  Recent Labs Lab 04/20/13 0256 04/21/13 0500 04/22/13 0910  WBC 20.3* 11.3* 7.4  NEUTROABS 17.7* 8.7* 4.7  HGB 9.7* 12.2 12.2  HCT 41.5 37.9 38.0  MCV 87.2 88.1 88.8  PLT PLATELET CLUMPS NOTED ON SMEAR, COUNT APPEARS ADEQUATE 242 248   Cardiac Enzymes: No results found for this basename: CKTOTAL, CKMB, CKMBINDEX, TROPONINI,  in the last 168 hours BNP: BNP (last 3 results) No results found for this basename: PROBNP,  in the last 8760 hours CBG: No results found for this basename: GLUCAP,  in the last 168 hours     Signed:  Dia Crawford, MD [facility] [phone] pager

## 2013-04-23 NOTE — Progress Notes (Signed)
Patient d/c home. Stable.- Declyn Offield RN 

## 2013-04-26 LAB — CULTURE, BLOOD (ROUTINE X 2): Culture: NO GROWTH

## 2013-05-06 ENCOUNTER — Other Ambulatory Visit (INDEPENDENT_AMBULATORY_CARE_PROVIDER_SITE_OTHER): Payer: Self-pay | Admitting: General Surgery

## 2013-05-06 ENCOUNTER — Ambulatory Visit (INDEPENDENT_AMBULATORY_CARE_PROVIDER_SITE_OTHER): Payer: Medicare Other | Admitting: General Surgery

## 2013-05-06 ENCOUNTER — Telehealth (INDEPENDENT_AMBULATORY_CARE_PROVIDER_SITE_OTHER): Payer: Self-pay | Admitting: *Deleted

## 2013-05-06 ENCOUNTER — Encounter (INDEPENDENT_AMBULATORY_CARE_PROVIDER_SITE_OTHER): Payer: Self-pay | Admitting: General Surgery

## 2013-05-06 VITALS — BP 124/78 | HR 84 | Temp 97.7°F | Resp 20 | Ht 67.0 in | Wt 204.2 lb

## 2013-05-06 DIAGNOSIS — K8309 Other cholangitis: Secondary | ICD-10-CM

## 2013-05-06 DIAGNOSIS — K8050 Calculus of bile duct without cholangitis or cholecystitis without obstruction: Secondary | ICD-10-CM

## 2013-05-06 LAB — CBC WITH DIFFERENTIAL/PLATELET
BASOS ABS: 0 10*3/uL (ref 0.0–0.1)
BASOS PCT: 0 % (ref 0–1)
EOS ABS: 0 10*3/uL (ref 0.0–0.7)
Eosinophils Relative: 0 % (ref 0–5)
HCT: 41.3 % (ref 36.0–46.0)
Hemoglobin: 13.6 g/dL (ref 12.0–15.0)
LYMPHS ABS: 2.2 10*3/uL (ref 0.7–4.0)
Lymphocytes Relative: 32 % (ref 12–46)
MCH: 28.5 pg (ref 26.0–34.0)
MCHC: 32.9 g/dL (ref 30.0–36.0)
MCV: 86.4 fL (ref 78.0–100.0)
Monocytes Absolute: 0.6 10*3/uL (ref 0.1–1.0)
Monocytes Relative: 9 % (ref 3–12)
NEUTROS PCT: 59 % (ref 43–77)
Neutro Abs: 4.1 10*3/uL (ref 1.7–7.7)
Platelets: 313 10*3/uL (ref 150–400)
RBC: 4.78 MIL/uL (ref 3.87–5.11)
RDW: 15.6 % — ABNORMAL HIGH (ref 11.5–15.5)
WBC: 6.9 10*3/uL (ref 4.0–10.5)

## 2013-05-06 NOTE — Telephone Encounter (Signed)
Levada Dy called back and I provided information below.  She states she will have to call and reschedule.  I provided her with the radiology schedulers phone number.

## 2013-05-06 NOTE — Telephone Encounter (Signed)
LMOM for pts daughter Levada Dy to return my call.  I was calling to inform her of pts appt for a HIDA scan at [facility] on 05/16/13 with an arrival time of 9:15am.  Pt is to be NPO after midnight for this test.

## 2013-05-06 NOTE — Patient Instructions (Signed)
You appear to be doing well following a recent hospitalization for common bile duct stones and acute cholangitis.  We have talked about the risks and benefits of elective cholecystectomy. We have also talked about the risks and benefits of observation only. You have stated that you are interested in cholecystectomy.  We will obtain baseline labs  this week.  You will be referred back to your primary physician, Dr. Garnet Koyanagi, for a medical risk assessment  You'll be referred to your cardiologist, Dr. Jenkins Rouge, for a cardiac risk assessment.  You will be scheduled for an x-ray called a biliary scan.  Return to see Dr. Dalbert Batman in 3-4 weeks.

## 2013-05-06 NOTE — Progress Notes (Addendum)
Patient ID: Deanna [name], female   DOB: 1929-08-09, 80 y.o.   MRN: [id number]  Chief Complaint  Patient presents with  . Cholelithiasis    new pt    HPI Deanna [name] is a 80 y.o. female.  She was hospitalized from January 17 2 January 20 for acute cholangitis. Blood cultures were positive for Escherichia coli. ERCP with sphincterotomy removed 2 stones. I saw her the day after her ERCP and she was doing well. She wanted to go home and returns today with her daughter to discuss whether or not she should have an elective cholecystectomy. Her ultrasound shows gallbladder sludge. She has not had any lab work since discharge. She has not seen any other physician since discharge. She states that her appetite is not as good and she thinks he's lost some weight. She had one episode of nausea and vomiting after eating a chicken wing. She states her bowel movements are solid, dark brown.  Comorbidities include history of open abdominal aortic aneurysm resection, history of diastolic congestive heart failure, history of pulmonary hypertension, preserved ejection fraction, primary biliary cirrhosis, hyperlipidemia, hypertension.  She is in no distress today.  HPI  Past Medical History  Diagnosis Date  . HLD (hyperlipidemia)   . HTN (hypertension)   . AAA (abdominal aortic aneurysm) 2011    Dr Early repaired this  . Cirrhosis, biliary     liver bx 01/2008  . Fatigue   . Epistaxis   . OSA (obstructive sleep apnea)     mild on ss of 12/2008  . Leg pain     bilateral  . GERD (gastroesophageal reflux disease)   . Pneumonia   . Bleeding ulcer 1980s    H. Pylori  . Acute ischemic colitis 03/06/2012  . Anemia   . Arthritis     bilateral hands and knees  . Blood transfusion without reported diagnosis   . Cataract     In the past  . Diastolic CHF [id number]    Past Surgical History  Procedure Laterality Date  . 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 Cataract extraction     . Breast biopsy    . Colonoscopy  03/08/2012    Procedure: COLONOSCOPY;  Surgeon: Gatha Mayer, MD;  Location: [facility];  Service: Endoscopy;  Laterality: N/A;  . Abdominal aortic aneurysm repair  02/01/2010  . Ercp N/A 04/20/2013    Procedure: ENDOSCOPIC RETROGRADE CHOLANGIOPANCREATOGRAPHY (ERCP);  Surgeon: Ladene Artist, MD;  Location: [facility];  Service: Endoscopy;  Laterality: N/A;    Family History  Problem Relation Age of Onset  . Cancer Brother     prostate    Social History History  Substance Use Topics  . Smoking status: Former Smoker -- 52 years    Types: Cigarettes    Quit date: 02/01/2010  . Smokeless tobacco: Not on file  . Alcohol Use: No    Allergies  Allergen Reactions  . Crestor [Rosuvastatin]   . Ezetimibe Other (See Comments)    Current Outpatient Prescriptions  Medication Sig Dispense Refill  . Ascorbic Acid (VITAMIN C) 500 MG tablet Take 500 mg by mouth every morning.       Marland Kitchen aspirin EC 81 MG tablet Take 81 mg by mouth every morning.      . B Complex-C (B-COMPLEX WITH VITAMIN C) tablet Take 1 tablet by mouth every morning.      . cholecalciferol (VITAMIN D) 1000 UNITS tablet Take  1,000 Units by mouth 2 (two) times daily.      . ciprofloxacin (CIPRO) 500 MG tablet Take 1 tablet (500 mg total) by mouth 2 (two) times daily.  28 tablet  0  . co-enzyme Q-10 30 MG capsule Take 30 mg by mouth daily.       . diclofenac sodium (VOLTAREN) 1 % GEL Apply 2 g topically 3 (three) times daily as needed (knee pain and swelling).      . fenofibrate 160 MG tablet Take 160 mg by mouth at bedtime.      . fish oil-omega-3 fatty acids 1000 MG capsule Take 2 g by mouth every morning.       . Flaxseed, Linseed, (FLAXSEED OIL PO) Take 1 capsule by mouth every morning.       Marland Kitchen lisinopril (PRINIVIL,ZESTRIL) 5 MG tablet Take 1 tablet (5 mg total) by mouth daily.  30 tablet  0  . simvastatin (ZOCOR) 40 MG tablet Take 1 tablet (40 mg total) by mouth at bedtime.  90 tablet  3    No current facility-administered medications for this visit.    Review of Systems Review of Systems  Constitutional: Negative for fever, chills and unexpected weight change.  HENT: Negative for congestion, hearing loss, sore throat, trouble swallowing and voice change.   Eyes: Negative for visual disturbance.  Respiratory: Negative for cough and wheezing.   Cardiovascular: Negative for chest pain, palpitations and leg swelling.  Gastrointestinal: Positive for nausea, vomiting, abdominal pain and constipation. Negative for diarrhea, blood in stool, abdominal distention and anal bleeding.  Genitourinary: Negative for hematuria, vaginal bleeding and difficulty urinating.  Musculoskeletal: Negative for arthralgias.  Skin: Negative for rash and wound.  Neurological: Negative for seizures, syncope and headaches.  Hematological: Negative for adenopathy. Does not bruise/bleed easily.  Psychiatric/Behavioral: Negative for confusion.    Blood pressure 124/78, pulse 84, temperature 97.7 F (36.5 C), temperature source Temporal, resp. rate 20, height 5\' 7"  (1.702 m), weight 204 lb 3.2 oz (92.625 kg).  Physical Exam Physical Exam  Constitutional: She is oriented to person, place, and time. She appears well-developed and well-nourished. No distress.  HENT:  Head: Normocephalic and atraumatic.  Nose: Nose normal.  Mouth/Throat: No oropharyngeal exudate.  Eyes: Conjunctivae and EOM are normal. Pupils are equal, round, and reactive to light. Left eye exhibits no discharge. No scleral icterus.  Neck: Neck supple. No JVD present. No tracheal deviation present. No thyromegaly present.  Cardiovascular: Normal rate, regular rhythm, normal heart sounds and intact distal pulses.   No murmur heard. Her femoral pulses are hard to feel but I can palpate dorsalis pedis pulses bilaterally.  Pulmonary/Chest: Effort normal and breath sounds normal. No respiratory distress. She has no wheezes. She has no  rales. She exhibits no tenderness.  Abdominal: Soft. Bowel sounds are normal. She exhibits no distension and no mass. There is no tenderness. There is no rebound and no guarding.  Long midline incision goes to the right of the umbilicus. Sort of a diffuse bulge but no well-defined hernia. Nontender.  Musculoskeletal: She exhibits no edema and no tenderness.  Lymphadenopathy:    She has no cervical adenopathy.  Neurological: She is alert and oriented to person, place, and time. She exhibits normal muscle tone. Coordination normal.  Skin: Skin is warm. No rash noted. She is not diaphoretic. No erythema. No pallor.  Psychiatric: She has a normal mood and affect. Her behavior is normal. Judgment and thought content normal.    [facility]  records. Imaging studies  Assessment    Acute cholangitis secondary to choledocholithiasis. Recovering nicely following ERCP with sphincterotomy and course of antibiotics Gallbladder sludge and probable gallstones. It is appropriate to consider elective cholecystectomy since this is the most likely source of her CBD stones.  Primary biliary cirrhosis, followed by Dr. Lucio Edward and by Dr. Elie Confer at [facility]. It is not clear whether this could be participating in her stone disease.  Status post open abdominal aortic aneurysm repair. Seems to be doing well with adequate lower extremity vascularization  History congestive heart failure  History of pulmonary hypertension  Hypertension  Hyperlipidemia     Plan    I had a long discussion with the patient and her daughter. We talked about the anatomy, pathophysiology of her cholangitis, ultrasound report. In general, the patient is interested in having her gallbladder out it if we think she would benefit  I told her that most likely this was a good option but that we need to do a better risk assessment for general anesthesia first.  She'll be referred back to her cardiologist, Dr. Jenkins Rouge, for cardiac risk assessment  She'll be referred back to her PCP, Dr. Lizbeth Bark, for medical risk assessment  We will draw a CBC and complete metabolic panel female to make sure things have normalized  Will discuss with Dr. Fuller Plan about her primary biliary cirrhosis  ADDENDUM:   Dr. Lynne Leader comments are recorded here: "Her PBC is very mild. No clinical signs of cirrhosis. I don't think she is at higher risk for hepatic complications from surgery given underlying mild PBC. PBC is not usually associated with CBD stones and she has a sphintcerotomy now. After lap chole I can see her back to discuss possible benefits of UDCA treatment. "    Will obtain a CCK stimulated hepatobiliary scan  Return to see me in one month.        Edsel Petrin. Dalbert Batman, M.D., [facility] General and Minimally invasive Surgery Breast and Colorectal Surgery Office:   [phone] Pager:   [phone]  05/06/2013, 10:30 AM

## 2013-05-10 ENCOUNTER — Encounter: Payer: Self-pay | Admitting: Family Medicine

## 2013-05-10 ENCOUNTER — Ambulatory Visit (INDEPENDENT_AMBULATORY_CARE_PROVIDER_SITE_OTHER): Payer: Medicare Other | Admitting: Family Medicine

## 2013-05-10 VITALS — BP 137/86 | HR 99 | Temp 97.9°F | Wt 205.0 lb

## 2013-05-10 DIAGNOSIS — I272 Pulmonary hypertension, unspecified: Secondary | ICD-10-CM

## 2013-05-10 DIAGNOSIS — I5030 Unspecified diastolic (congestive) heart failure: Secondary | ICD-10-CM

## 2013-05-10 DIAGNOSIS — Z01818 Encounter for other preprocedural examination: Secondary | ICD-10-CM

## 2013-05-10 DIAGNOSIS — I2789 Other specified pulmonary heart diseases: Secondary | ICD-10-CM

## 2013-05-10 DIAGNOSIS — I509 Heart failure, unspecified: Secondary | ICD-10-CM

## 2013-05-10 DIAGNOSIS — I10 Essential (primary) hypertension: Secondary | ICD-10-CM

## 2013-05-10 MED ORDER — LISINOPRIL 5 MG PO TABS: 5.0000 mg | ORAL_TABLET | Freq: Every day | ORAL | Status: DC

## 2013-05-10 NOTE — Progress Notes (Signed)
Pre visit review using our clinic review tool, if applicable. No additional management support is needed unless otherwise documented below in the visit note. 

## 2013-05-12 ENCOUNTER — Encounter: Payer: Self-pay | Admitting: Family Medicine

## 2013-05-12 DIAGNOSIS — E669 Obesity, unspecified: Secondary | ICD-10-CM | POA: Insufficient documentation

## 2013-05-12 NOTE — Progress Notes (Signed)
Subjective:    Deanna [name] is a 80 y.o. female who presents to the office today for a preoperative consultation at the request of surgeon Dr Dalbert Batman who plans on performing chloecystectomy on TBA -. This consultation is requested for the specific conditions prompting preoperative evaluation (i.e. because of potential affect on operative risk): age, pulmoary htn and chf per d/c summary.. Planned anesthesia: general. The patient has the following known anesthesia issues: none. Patients bleeding risk: use of Ca-channel blockers (see med list) -- d/c in hospital.   Patient does not have objections to receiving blood products if needed.  The following portions of the patient's history were reviewed and updated as appropriate:  She  has a past medical history of HLD (hyperlipidemia); HTN (hypertension); AAA (abdominal aortic aneurysm) (2011); Cirrhosis, biliary; Fatigue; Epistaxis; OSA (obstructive sleep apnea); Leg pain; GERD (gastroesophageal reflux disease); Pneumonia; Bleeding ulcer (1980s); Acute ischemic colitis (03/06/2012); Anemia; Arthritis; Blood transfusion without reported diagnosis; Cataract; and Diastolic CHF (1/61/0960). She  does not have any pertinent problems on file. She  has past surgical history that includes Iliac artery - femoral artery bypass graft (2011); Cataract extraction; Breast biopsy; Colonoscopy (03/08/2012); Abdominal aortic aneurysm repair (02/01/2010); and ERCP (N/A, 04/20/2013). Her family history includes Cancer in her brother. She  reports that she quit smoking about 3 years ago. Her smoking use included Cigarettes. She smoked 0.00 packs per day for 52 years. She does not have any smokeless tobacco history on file. She reports that she does not drink alcohol or use illicit drugs. She has a current medication list which includes the following prescription(s): lisinopril and simvastatin. Current Outpatient Prescriptions on File Prior to Visit  Medication Sig Dispense Refill   . simvastatin (ZOCOR) 40 MG tablet Take 1 tablet (40 mg total) by mouth at bedtime.  90 tablet  3   No current facility-administered medications on file prior to visit.   She is allergic to crestor and ezetimibe..  Review of Systems Review of Systems  Constitutional: Negative for activity change, appetite change and fatigue.  HENT: Negative for hearing loss, congestion, tinnitus and ear discharge.   Eyes: Negative for visual disturbance  Respiratory: Negative for cough, chest tightness and shortness of breath.   Cardiovascular: Negative for chest pain, palpitations and leg swelling.  Gastrointestinal: Negative for abdominal pain, diarrhea, constipation and abdominal distention.  Genitourinary: Negative for urgency, frequency, decreased urine volume and difficulty urinating.  Musculoskeletal: Negative for back pain, arthralgias and gait problem.  Skin: Negative for color change, pallor and rash.  Neurological: Negative for dizziness, light-headedness, numbness and headaches.  Hematological: Negative for adenopathy. Does not bruise/bleed easily.  Psychiatric/Behavioral: Negative for suicidal ideas, confusion, sleep disturbance, self-injury, dysphoric mood, decreased concentration and agitation.        Objective:    BP 137/86  Pulse 99  Temp(Src) 97.9 F (36.6 C) (Oral)  Wt 205 lb (92.987 kg)  SpO2 95% General appearance: alert, cooperative, appears stated age and no distress Head: Normocephalic, without obvious abnormality, atraumatic Eyes: conjunctivae/corneas clear. PERRL, EOM's intact. Fundi benign. Ears: normal TM's and external ear canals both ears Nose: Nares normal. Septum midline. Mucosa normal. No drainage or sinus tenderness. Throat: lips, mucosa, and tongue normal; teeth and gums normal Neck: no adenopathy, supple, symmetrical, trachea midline and thyroid not enlarged, symmetric, no tenderness/mass/nodules Lungs: clear to auscultation bilaterally Heart: S1, S2  normal Abdomen: soft, non-tender; bowel sounds normal; no masses,  no organomegaly Extremities: extremities normal, atraumatic, no cyanosis or edema Pulses: 2+  and symmetric Skin: Skin color, texture, turgor normal. No rashes or lesions Lymph nodes: Cervical, supraclavicular, and axillary nodes normal. Neurologic: Alert and oriented X 3, normal strength and tone. Normal symmetric reflexes. Normal coordination and gait    Cardiographics ECG: no change since previous ECG dated 01/2010 Echocardiogram: done in hospital    Lab Review  Office Visit on 05/06/2013  Component Date Value  . WBC 05/06/2013 6.9   . RBC 05/06/2013 4.78   . Hemoglobin 05/06/2013 13.6   . HCT 05/06/2013 41.3   . MCV 05/06/2013 86.4   . [facility] 05/06/2013 28.5   . MCHC 05/06/2013 32.9   . RDW 05/06/2013 15.6*  . Platelets 05/06/2013 313   . Neutrophils Relative % 05/06/2013 59   . Neutro Abs 05/06/2013 4.1   . Lymphocytes Relative 05/06/2013 32   . Lymphs Abs 05/06/2013 2.2   . Monocytes Relative 05/06/2013 9   . Monocytes Absolute 05/06/2013 0.6   . Eosinophils Relative 05/06/2013 0   . Eosinophils Absolute 05/06/2013 0.0   . Basophils Relative 05/06/2013 0   . Basophils Absolute 05/06/2013 0.0   . Smear Review 05/06/2013 Criteria for review not met   Admission on 04/20/2013, Discharged on 04/23/2013  Component Date Value  . Sodium 04/20/2013 135*  . Potassium 04/20/2013 4.3   . Chloride 04/20/2013 97   . CO2 04/20/2013 19   . Glucose, Bld 04/20/2013 134*  . BUN 04/20/2013 16   . Creatinine, Ser 04/20/2013 0.87   . Calcium 04/20/2013 9.5   . Total Protein 04/20/2013 7.6   . Albumin 04/20/2013 3.0*  . AST 04/20/2013 116*  . ALT 04/20/2013 76*  . Alkaline Phosphatase 04/20/2013 332*  . Total Bilirubin 04/20/2013 6.1*  . GFR calc non Af Amer 04/20/2013 61*  . GFR calc Af Amer 04/20/2013 70*  . WBC 04/20/2013 20.3*  . RBC 04/20/2013 4.76   . Hemoglobin 04/20/2013 9.7*  . HCT 04/20/2013 41.5   .  MCV 04/20/2013 87.2   . MCH 04/20/2013 20.4*  . MCHC 04/20/2013 23.4*  . RDW 04/20/2013 14.6   . Platelets 04/20/2013 PLATELET CLUMPS NOTED ON SMEAR, COUNT APPEARS ADEQUATE   . Neutrophils Relative % 04/20/2013 87*  . Lymphocytes Relative 04/20/2013 7*  . Monocytes Relative 04/20/2013 6   . Eosinophils Relative 04/20/2013 0   . Basophils Relative 04/20/2013 0   . Neutro Abs 04/20/2013 17.7*  . Lymphs Abs 04/20/2013 1.4   . Monocytes Absolute 04/20/2013 1.2*  . Eosinophils Absolute 04/20/2013 0.0   . Basophils Absolute 04/20/2013 0.0   . WBC Morphology 04/20/2013 TOXIC GRANULATION   . Lactic Acid, Venous 04/20/2013 1.9   . Lipase 04/20/2013 11   . Color, Urine 04/20/2013 ORANGE*  . APPearance 04/20/2013 CLOUDY*  . Specific Gravity, Urine 04/20/2013 >1.046*  . pH 04/20/2013 5.5   . Glucose, UA 04/20/2013 NEGATIVE   . Hgb urine dipstick 04/20/2013 TRACE*  . Bilirubin Urine 04/20/2013 LARGE*  . Ketones, ur 04/20/2013 NEGATIVE   . Protein, ur 04/20/2013 NEGATIVE   . Urobilinogen, UA 04/20/2013 2.0*  . Nitrite 04/20/2013 POSITIVE*  . Leukocytes, UA 04/20/2013 MODERATE*  . Specimen Description 04/20/2013 BLOOD LEFT HAND   . Special Requests 04/20/2013 BOTTLES DRAWN AEROBIC AND ANAEROBIC 5CC   . Culture  Setup Time 04/20/2013                     Value:04/20/2013 11:15  Performed at [facility]  . Culture 04/20/2013                     Value:ESCHERICHIA COLI                         Note: Gram Stain Report Called to,Read Back By and Verified With: JENNIFER MALMSELT 04/21/2013 0015AM MITCV                         Performed at [facility]  . Report Status 04/20/2013 04/23/2013 FINAL   . Organism ID, Bacteria 04/20/2013 ESCHERICHIA COLI   . Specimen Description 04/20/2013 BLOOD RIGHT HAND   . Special Requests 04/20/2013 BOTTLES DRAWN AEROBIC AND ANAEROBIC 5CC   . Culture  Setup Time 04/20/2013                     Value:04/20/2013 11:15                          Performed at [facility]  . Culture 04/20/2013                     Value:NO GROWTH 5 DAYS                         Performed at [facility]  . Report Status 04/20/2013 04/26/2013 FINAL   . Prothrombin Time 04/20/2013 14.5   . INR 04/20/2013 1.15   . Squamous Epithelial / LPF 04/20/2013 RARE   . WBC, UA 04/20/2013 11-20   . RBC / HPF 04/20/2013 0-2   . Bacteria, UA 04/20/2013 MANY*  . Specimen Description 04/20/2013 URINE, CLEAN CATCH   . Special Requests 04/20/2013 NONE   . Culture  Setup Time 04/20/2013                     Value:04/20/2013 22:44                         Performed at [facility]  . Colony Count 04/20/2013                     Value:3,000 COLONIES/ML                         Performed at [facility]  . Culture 04/20/2013                     Value:INSIGNIFICANT GROWTH                         Performed at [facility]  . Report Status 04/20/2013 04/21/2013 FINAL   . Sodium 04/21/2013 137   . Potassium 04/21/2013 3.6*  . Chloride 04/21/2013 101   . CO2 04/21/2013 22   . Glucose, Bld 04/21/2013 96   . BUN 04/21/2013 12   . Creatinine, Ser 04/21/2013 0.90   . Calcium 04/21/2013 8.8   . Total Protein 04/21/2013 6.4   . Albumin 04/21/2013 2.4*  . AST 04/21/2013 80*  . ALT 04/21/2013 62*  . Alkaline Phosphatase 04/21/2013 289*  . Total Bilirubin 04/21/2013 8.2*  . GFR calc non Af Amer 04/21/2013 58*  . GFR calc Af Amer 04/21/2013 68*  .  WBC 04/21/2013 11.3*  . RBC 04/21/2013 4.30   . Hemoglobin 04/21/2013 12.2   . HCT 04/21/2013 37.9   . MCV 04/21/2013 88.1   . [place] 04/21/2013 28.4   . MCHC 04/21/2013 32.2   . RDW 04/21/2013 14.2   . Platelets 04/21/2013 242   . Neutrophils Relative % 04/21/2013 76   . Neutro Abs 04/21/2013 8.7*  . Lymphocytes Relative 04/21/2013 16   . Lymphs Abs 04/21/2013 1.8   . Monocytes Relative 04/21/2013 8   . Monocytes Absolute 04/21/2013 0.9   . Eosinophils Relative 04/21/2013  0   . Eosinophils Absolute 04/21/2013 0.0   . Basophils Relative 04/21/2013 0   . Basophils Absolute 04/21/2013 0.0   . Total Protein 04/22/2013 6.5   . Albumin 04/22/2013 2.4*  . AST 04/22/2013 42*  . ALT 04/22/2013 45*  . Alkaline Phosphatase 04/22/2013 283*  . Total Bilirubin 04/22/2013 3.3*  . Bilirubin, Direct 04/22/2013 2.1*  . Indirect Bilirubin 04/22/2013 1.2*  . WBC 04/22/2013 7.4   . RBC 04/22/2013 4.28   . Hemoglobin 04/22/2013 12.2   . HCT 04/22/2013 38.0   . MCV 04/22/2013 88.8   . [facility] 04/22/2013 28.5   . MCHC 04/22/2013 32.1   . RDW 04/22/2013 13.9   . Platelets 04/22/2013 248   . Neutrophils Relative % 04/22/2013 64   . Neutro Abs 04/22/2013 4.7   . Lymphocytes Relative 04/22/2013 28   . Lymphs Abs 04/22/2013 2.1   . Monocytes Relative 04/22/2013 7   . Monocytes Absolute 04/22/2013 0.6   . Eosinophils Relative 04/22/2013 0   . Eosinophils Absolute 04/22/2013 0.0   . Basophils Relative 04/22/2013 0   . Basophils Absolute 04/22/2013 0.0   . Sodium 04/22/2013 139   . Potassium 04/22/2013 3.2*  . Chloride 04/22/2013 101   . CO2 04/22/2013 24   . Glucose, Bld 04/22/2013 113*  . BUN 04/22/2013 11   . Creatinine, Ser 04/22/2013 0.90   . Calcium 04/22/2013 9.0   . Total Protein 04/22/2013 6.5   . Albumin 04/22/2013 2.4*  . AST 04/22/2013 40*  . ALT 04/22/2013 45*  . Alkaline Phosphatase 04/22/2013 283*  . Total Bilirubin 04/22/2013 3.1*  . GFR calc non Af Amer 04/22/2013 58*  . GFR calc Af Amer 04/22/2013 68*      Assessment:      80 y.o. female with planned surgery as above.   Known risk factors for perioperative complications: Congestive heart failure    Cardiac Risk Estimation: per cardiology  Current medications which may produce withdrawal symptoms if withheld perioperatively: na      Plan:    1. Preoperative workup as follows see tests done during recent hospitalization-- pt has cardiology clearance set up for next week. 2. Change in  medication regimen before surgery: Ca Channal blocker d/c in hospital. 3. Prophylaxis for cardiac events with perioperative beta-blockers: should be considered, specific regimen per anesthesia. 4. Invasive hemodynamic monitoring perioperatively: at the discretion of anesthesiologist. 5. Deep vein thrombosis prophylaxis postoperatively:regimen to be chosen by surgical team. 6. Surveillance for postoperative MI with ECG immediately postoperatively and on postoperative days 1 and 2 AND troponin levels 24 hours postoperatively and on day 4 or hospital discharge (whichever comes first): at the discretion of anesthesiologist. 7. Other measures: pt has cardiology clearance next week-- if needed consult triad hospitalist / cardiology

## 2013-05-12 NOTE — Assessment & Plan Note (Signed)
F/u cardiology 

## 2013-05-12 NOTE — Assessment & Plan Note (Signed)
F/u cardiology and pulm

## 2013-05-13 ENCOUNTER — Telehealth: Payer: Self-pay | Admitting: Family Medicine

## 2013-05-13 NOTE — Telephone Encounter (Signed)
Relevant patient education assigned to patient using Emmi. ° °

## 2013-05-16 ENCOUNTER — Other Ambulatory Visit (HOSPITAL_COMMUNITY): Payer: Medicare Other

## 2013-05-20 ENCOUNTER — Encounter (HOSPITAL_COMMUNITY)
Admission: RE | Admit: 2013-05-20 | Discharge: 2013-05-20 | Disposition: A | Discharge status: Home / Self Care | Payer: Medicare Other | Source: Ambulatory Visit | Attending: General Surgery | Admitting: General Surgery

## 2013-05-20 DIAGNOSIS — K8020 Calculus of gallbladder without cholecystitis without obstruction: Secondary | ICD-10-CM | POA: Insufficient documentation

## 2013-05-20 DIAGNOSIS — R109 Unspecified abdominal pain: Secondary | ICD-10-CM | POA: Insufficient documentation

## 2013-05-20 DIAGNOSIS — K8050 Calculus of bile duct without cholangitis or cholecystitis without obstruction: Secondary | ICD-10-CM

## 2013-05-20 MED ORDER — TECHNETIUM TC 99M MEBROFENIN IV KIT
5.6000 | PACK | Freq: Once | INTRAVENOUS | Status: AC | PRN
  Administered 2013-05-20: 6 via INTRAVENOUS

## 2013-05-24 ENCOUNTER — Ambulatory Visit (INDEPENDENT_AMBULATORY_CARE_PROVIDER_SITE_OTHER): Payer: Medicare Other | Admitting: Cardiovascular Disease

## 2013-05-24 ENCOUNTER — Encounter: Payer: Self-pay | Admitting: Cardiovascular Disease

## 2013-05-24 VITALS — BP 140/80 | HR 85 | Ht 67.0 in | Wt 190.0 lb

## 2013-05-24 DIAGNOSIS — I10 Essential (primary) hypertension: Secondary | ICD-10-CM

## 2013-05-24 DIAGNOSIS — I2789 Other specified pulmonary heart diseases: Secondary | ICD-10-CM

## 2013-05-24 DIAGNOSIS — Z01810 Encounter for preprocedural cardiovascular examination: Secondary | ICD-10-CM | POA: Insufficient documentation

## 2013-05-24 DIAGNOSIS — I5030 Unspecified diastolic (congestive) heart failure: Secondary | ICD-10-CM

## 2013-05-24 DIAGNOSIS — I272 Pulmonary hypertension, unspecified: Secondary | ICD-10-CM

## 2013-05-24 DIAGNOSIS — I509 Heart failure, unspecified: Secondary | ICD-10-CM

## 2013-05-24 NOTE — Assessment & Plan Note (Signed)
BP borderline with recent addition of ACE  Will likely need up titration to 10, or 20 mg in future  Will f/u with Dr Etter Sjogren.

## 2013-05-24 NOTE — Assessment & Plan Note (Signed)
Despite age no high risk clinical features and no complications from major AAA surgery in 2011.  Close BP control post op with pain control important  Clear to have surgery without further testing

## 2013-05-24 NOTE — Assessment & Plan Note (Signed)
Not clinically significant Normla lung exam  And no cor pulmonale

## 2013-05-24 NOTE — Patient Instructions (Signed)
Your physician recommends that you continue on your current medications as directed. Please refer to the Current Medication list given to you today.  Your physician wants you to follow-up in: 6 Months with Dr Turner You will receive a reminder letter in the mail two months in advance. If you don't receive a letter, please call our office to schedule the follow-up appointment.  

## 2013-05-24 NOTE — Progress Notes (Signed)
Patient ID: Deanna [name], female   DOB: [birth year], 80 y.o.   MRN: [id number]  80 yo referred by Dr Dalbert Batman for preop clearance.  No history of CAD  Had uneventful AAA repair in 10/11 by Dr Donnetta Hutching  .  Quit smoking at that time  Echo 2011 normal EF and mild aortic root dilatation.  History of elevated lipids with intolerance to multiple meds   F/U echo reviewed from 1/15  Study Conclusions  - Left ventricle: The cavity size was normal. Wall thickness was increased increased in a pattern of mild to moderate LVH. Systolic function was normal. The estimated ejection fraction was in the range of 60% to 65%. Wall motion was normal; there were no regional wall motion abnormalities. Doppler parameters are consistent with abnormal left ventricular relaxation (grade 1 diastolic dysfunction). - Aortic valve: Trileaflet; normal thickness, mildly calcified leaflets. Mild regurgitation. - Mitral valve: Mildly to moderately calcified annulus. Moderately calcified leaflets posterior. - Right atrium: The atrium was mildly dilated. - Pulmonary arteries: Systolic pressure was mildly increased. PA peak pressure: 47mm Hg (S).   She was hospitalized from January 17 2 January 20 for acute cholangitis. Blood cultures were positive for Escherichia coli. ERCP with sphincterotomy removed 2 stones. I saw her the day after her ERCP and she  Is  doing well.  She was given a diagnosis of diastolic CHF and pulmonary hypertension  I see no clinical correlation and no BNP during recent hospitalization  Recent echo benign with estimated PA pressure 40 mmHg  Only grade one diastolic dysfunction which is probably normal for age.  She has exterional dyspnea.     ROS: Denies fever, malais, weight loss, blurry vision, decreased visual acuity, cough, sputum, SOB, hemoptysis, pleuritic pain, palpitaitons, heartburn, abdominal pain, melena, lower extremity edema, claudication, or rash.  All other systems reviewed and  negative   General: Affect appropriate Healthy:  appears stated age 43: normal Neck supple with no adenopathy JVP normal no bruits no thyromegaly Lungs clear with no wheezing and good diaphragmatic motion Heart:  S1/S2 no murmur,rub, gallop or click PMI normal Abdomen: benighn, BS positve, no tenderness, no AAA no bruit.  No HSM or HJR Distal pulses intact with no bruits No edema Neuro non-focal Skin warm and dry No muscular weakness  Medications Current Outpatient Prescriptions  Medication Sig Dispense Refill  . aspirin 81 MG tablet Take 81 mg by mouth daily.      . IRON PO Take 1 tablet by mouth daily.      Marland Kitchen lisinopril (PRINIVIL,ZESTRIL) 5 MG tablet Take 1 tablet (5 mg total) by mouth daily.  90 tablet  3  . simvastatin (ZOCOR) 40 MG tablet Take 1 tablet (40 mg total) by mouth at bedtime.  90 tablet  3   No current facility-administered medications for this visit.    Allergies Crestor and Ezetimibe  Family History: Family History  Problem Relation Age of Onset  . Cancer Brother     prostate    Social History: History   Social History  . Marital Status: Widowed    Spouse Name: N/A    Number of Children: N/A  . Years of Education: N/A   Occupational History  . Not on file.   Social History Main Topics  . Smoking status: Former Smoker -- 52 years    Types: Cigarettes    Quit date: 02/01/2010  . Smokeless tobacco: Not on file  . Alcohol Use: No  . Drug Use: No  . Sexual  Activity: Not Currently   Other Topics Concern  . Not on file   Social History Narrative  . No narrative on file    Electrocardiogram:  SR rate 71 LAD nonspecific ST/T wave changes   Assessment and Plan

## 2013-05-24 NOTE — Assessment & Plan Note (Signed)
Mild age related no need for further w/u

## 2013-06-04 ENCOUNTER — Encounter (INDEPENDENT_AMBULATORY_CARE_PROVIDER_SITE_OTHER): Payer: Medicare Other | Admitting: General Surgery

## 2013-06-07 ENCOUNTER — Encounter (INDEPENDENT_AMBULATORY_CARE_PROVIDER_SITE_OTHER): Payer: Medicare Other | Admitting: General Surgery

## 2013-07-15 ENCOUNTER — Encounter (INDEPENDENT_AMBULATORY_CARE_PROVIDER_SITE_OTHER): Payer: Self-pay | Admitting: General Surgery

## 2013-07-15 ENCOUNTER — Ambulatory Visit (INDEPENDENT_AMBULATORY_CARE_PROVIDER_SITE_OTHER): Payer: Medicare Other | Admitting: General Surgery

## 2013-07-15 VITALS — BP 125/86 | HR 77 | Temp 98.6°F | Resp 18 | Ht 68.0 in | Wt 205.2 lb

## 2013-07-15 DIAGNOSIS — K8050 Calculus of bile duct without cholangitis or cholecystitis without obstruction: Secondary | ICD-10-CM

## 2013-07-15 DIAGNOSIS — K8309 Other cholangitis: Secondary | ICD-10-CM

## 2013-07-15 DIAGNOSIS — K745 Biliary cirrhosis, unspecified: Secondary | ICD-10-CM

## 2013-07-15 NOTE — Progress Notes (Signed)
Patient ID: Deanna [name], female   DOB: October 10, 1929, 80 y.o.   MRN: [id number] History: This 80 year old female returns to discuss her biliary tract problems.  She was hospitalized 3 months ago for acute cholangitis secondary to CBD stones. Blood cultures were positive for Escherichia coli. ERCP with sphincterotomy removed the 2 stones. CT scan and ultrasound showed gallbladder sludge but no evidence of acute cholecystitis. Outpatient hepatobiliary scan is completely normal.  She has had no abdominal or GI symptoms in the last 3 months. She is here today with her daughter. She is not want to have a gallbladder operation left it is central.  She has undergone medical violation eY mild. She's undergone cardiac clearance by Dr. Collier [place]. Her echocardiogram actually looks pretty good.  Comorbidities include that she quit smoking 3 years ago. Open abdominal aortic aneurysm resection 2011 by Dr. Donnetta Hutching. She tolerated the anesthetic well. History of diastolic congestive heart failure and pulmonary hypertension, although her echocardiogram looks pretty good. Ejection fraction preserved. She has primary biliary cirrhosis, which Dr. Fuller Plan states it is mild and that he does not think he is at high risk for hepatic complications with surgery Dr. Fuller Plan stated that he would like to see her back to discuss [facility] [place] treatment. Hypertension. Hyperlipidemia  Past history, family history, social history, and review of systems are documented on the chart, unchanged, and noncontributory except as described above  Exam: Patient is alert. Oriented. No distress. Mental status normal Lungs clear auscultation bilaterally Heart regular rate and rhythm. No ectopy Abdomen soft and nontender. Long midline incision. Large ventral incisional hernia, wide mouth, easily reducible Neurological alert and oriented to person place and time. Normal muscle tone. Gait normal  [facility] assessment. Primary care assessment.  GI assessment. Hepatobiliary scan. Hospital records  Assessment  Acute cholangitis secondary to choledocholithiasis. Recovering nicely following ERCP with sphincterotomy and course of antibiotics. Asymptomatic 3 months following event Gallbladder sludge and possible gallstones.  Primary biliary cirrhosis, followed by Dr. Lucio Edward and by [facility] Elie Confer at [facility].  Dr. Derrill Memo does not believe that this contributed to her choledocholithiasis.  Status post open abdominal aortic aneurysm repair. Seems to be doing well with adequate lower extremity vascularization  Large ventral incisional hernia, asymptomatic History Diastolic congestive heart failure  History of pulmonary hypertension  Hypertension  Hyperlipidemia   Plan  I had a long discussion with the patient and her daughter. We talked about different alternatives for treatment. We talked about observation alone and the low but definite possibility that she may develop CBD stones in the future. There is a  somewhat higher possibility that she may develop acute cholecystitis. She is aware that she may never develop gallbladder symptoms.  Her  large ventral hernia and her prior abdominal aortic aneurysm repair complicates consideration of cholecystectomy due to adhesions and increased risk of adjacent injury to adjacent organs. She was advised of this.  At this point in time she is not interested in having any surgery unless she becomes symptomatic. I think that is actually probably reasonable considering the probability of an acute event that would necessitate high-risk surgery.  Followup with me as necessary.   Edsel Petrin. Dalbert Batman, M.D., [facility] General and Minimally invasive Surgery Breast and Colorectal Surgery Office:   [phone] Pager:   [phone]

## 2013-07-15 NOTE — Patient Instructions (Signed)
It is good and you have had no abdominal pain or intestinal problems since I last saw you.  Your biliary scan is normal, demonstrating normal gallbladder function.  Your cardiac evaluation is good, and so your heart function is stable and strong enough to withstand operation, if we need to.  You have stated that you did not want to have an operation unless it is absolutely necessary. We have talked about the risk of you having recurrent attacks of abdominal pain from the gallbladder which is possible, but not certain. We have talked about the risks of surgery given your large ventral hernia and prior abdominal aortic aneurysm surgery.  Since you are asymptomatic and are not interested in surgery unless it is essential, we are going to hold off on gallbladder surgery for now. If you develop further abdominal pain, contact Dr. Fuller Plan or me.

## 2013-08-15 ENCOUNTER — Telehealth: Payer: Self-pay

## 2013-08-15 MED ORDER — SIMVASTATIN 40 MG PO TABS: 40.0000 mg | ORAL_TABLET | Freq: Every day | ORAL | Status: DC

## 2013-08-15 NOTE — Telephone Encounter (Signed)
Rx faxed.    KP 

## 2013-10-21 ENCOUNTER — Other Ambulatory Visit: Payer: Self-pay

## 2013-11-17 ENCOUNTER — Other Ambulatory Visit: Payer: Self-pay | Admitting: Family Medicine

## 2014-01-09 ENCOUNTER — Other Ambulatory Visit: Payer: Self-pay

## 2014-01-09 MED ORDER — LISINOPRIL 5 MG PO TABS: 5.0000 mg | ORAL_TABLET | Freq: Every day | ORAL | Status: DC

## 2014-02-07 ENCOUNTER — Telehealth: Payer: Self-pay | Admitting: Family Medicine

## 2014-02-07 MED ORDER — SIMVASTATIN 40 MG PO TABS
ORAL_TABLET | ORAL | Status: DC
Start: 2014-02-07 — End: 2014-02-24

## 2014-02-07 NOTE — Telephone Encounter (Signed)
°  Caller name: Katheleen, Stella Relation to pt: self  Call back number: [phone] [facility]: [facility] [phone]  Reason for call:   Pt thought her f/u appointment was 02/07/14 today and it was really 02/17/14 which does not work with her daughter due to her daughter being her transportation. Pt reescheduled appointment 02/24/14. Requesting a refill of simvastatin (ZOCOR) 40 MG tablet to hold her over. Pt is completely out.

## 2014-02-07 NOTE — Telephone Encounter (Signed)
Rx faxed and VM left making the patient aware.       KP 

## 2014-02-17 ENCOUNTER — Ambulatory Visit: Payer: Medicare Other | Admitting: Family Medicine

## 2014-02-24 ENCOUNTER — Encounter: Payer: Self-pay | Admitting: Family Medicine

## 2014-02-24 ENCOUNTER — Ambulatory Visit (INDEPENDENT_AMBULATORY_CARE_PROVIDER_SITE_OTHER): Payer: Medicare Other | Admitting: Family Medicine

## 2014-02-24 VITALS — BP 124/86 | HR 95 | Temp 97.6°F | Wt 203.6 lb

## 2014-02-24 DIAGNOSIS — R5383 Other fatigue: Secondary | ICD-10-CM

## 2014-02-24 DIAGNOSIS — M170 Bilateral primary osteoarthritis of knee: Secondary | ICD-10-CM

## 2014-02-24 DIAGNOSIS — I10 Essential (primary) hypertension: Secondary | ICD-10-CM

## 2014-02-24 DIAGNOSIS — E785 Hyperlipidemia, unspecified: Secondary | ICD-10-CM

## 2014-02-24 DIAGNOSIS — R42 Dizziness and giddiness: Secondary | ICD-10-CM

## 2014-02-24 MED ORDER — DICLOFENAC SODIUM 1 % TD GEL
2.0000 g | Freq: Three times a day (TID) | TRANSDERMAL | Status: DC | PRN
Start: 2014-02-24 — End: 2015-09-09

## 2014-02-24 MED ORDER — LOSARTAN POTASSIUM 50 MG PO TABS: 50.0000 mg | ORAL_TABLET | Freq: Every day | ORAL | Status: DC

## 2014-02-24 NOTE — Progress Notes (Signed)
Pre visit review using our clinic review tool, if applicable. No additional management support is needed unless otherwise documented below in the visit note. 

## 2014-02-24 NOTE — Patient Instructions (Signed)

## 2014-02-24 NOTE — Progress Notes (Signed)
   Subjective:    Patient ID: Deanna [name], female    DOB: Apr 30, 1929, 80 y.o.   MRN: [id number]  HPI Pt here with her daughter c/o dry cough and myalgias since starting back on statin.  No other complaints   Review of Systems As above    Objective:   Physical Exam BP 124/86 mmHg  Pulse 95  Temp(Src) 97.6 F (36.4 C) (Oral)  Wt 203 lb 9.6 oz (92.352 kg)  SpO2 95% General appearance: alert, cooperative, appears stated age and no distress Throat: lips, mucosa, and tongue normal; teeth and gums normal Neck: no adenopathy, supple, symmetrical, trachea midline and thyroid not enlarged, symmetric, no tenderness/mass/nodules Lungs: clear to auscultation bilaterally Heart: S1, S2 normal Extremities: extremities normal, atraumatic, no cyanosis or edema        Assessment & Plan:  1. Primary osteoarthritis of both knees Refill meds - diclofenac sodium (VOLTAREN) 1 % GEL; Apply 2 g topically 3 (three) times daily as needed (knee pain and swelling).  Dispense: 100 g; Refill: 5  2. Essential hypertension D/c lisinopril secondary to cough - losartan (COZAAR) 50 MG tablet; Take 1 tablet (50 mg total) by mouth daily.  Dispense: 90 tablet; Refill: 3 - Basic metabolic panel - CBC with Differential  3. Hyperlipidemia   - Hepatic function panel - Lipid panel  4. Other fatigue   - Basic metabolic panel - CBC with Differential - TSH  5. Dizziness and giddiness   - Vitamin B12

## 2014-02-25 ENCOUNTER — Encounter: Payer: Self-pay | Admitting: Family Medicine

## 2014-02-25 LAB — CBC WITH DIFFERENTIAL/PLATELET
Basophils Absolute: 0 10*3/uL (ref 0.0–0.1)
Basophils Relative: 0.4 % (ref 0.0–3.0)
EOS PCT: 0.1 % (ref 0.0–5.0)
Eosinophils Absolute: 0 10*3/uL (ref 0.0–0.7)
HEMATOCRIT: 46.3 % — AB (ref 36.0–46.0)
Hemoglobin: 15 g/dL (ref 12.0–15.0)
Lymphocytes Relative: 36.3 % (ref 12.0–46.0)
Lymphs Abs: 3.5 10*3/uL (ref 0.7–4.0)
MCHC: 32.4 g/dL (ref 30.0–36.0)
MCV: 87.6 fl (ref 78.0–100.0)
MONOS PCT: 6.5 % (ref 3.0–12.0)
Monocytes Absolute: 0.6 10*3/uL (ref 0.1–1.0)
Neutro Abs: 5.5 10*3/uL (ref 1.4–7.7)
Neutrophils Relative %: 56.7 % (ref 43.0–77.0)
Platelets: 260 10*3/uL (ref 150.0–400.0)
RBC: 5.28 Mil/uL — ABNORMAL HIGH (ref 3.87–5.11)
RDW: 15.2 % (ref 11.5–15.5)
WBC: 9.6 10*3/uL (ref 4.0–10.5)

## 2014-02-25 LAB — BASIC METABOLIC PANEL
BUN: 28 mg/dL — AB (ref 6–23)
CO2: 25 mEq/L (ref 19–32)
Calcium: 10.4 mg/dL (ref 8.4–10.5)
Chloride: 102 mEq/L (ref 96–112)
Creatinine, Ser: 1.1 mg/dL (ref 0.4–1.2)
GFR: 48.94 mL/min — AB (ref >60.00)
Glucose, Bld: 93 mg/dL (ref 70–99)
POTASSIUM: 4.7 meq/L (ref 3.5–5.1)
SODIUM: 139 meq/L (ref 135–145)

## 2014-02-25 LAB — VITAMIN B12: Vitamin B-12: 332 pg/mL (ref 211–911)

## 2014-02-25 LAB — HEPATIC FUNCTION PANEL
ALK PHOS: 272 U/L — AB (ref 39–117)
ALT: 25 U/L (ref 0–35)
AST: 38 U/L — AB (ref 0–37)
Albumin: 3.6 g/dL (ref 3.5–5.2)
BILIRUBIN DIRECT: 0 mg/dL (ref 0.0–0.3)
Total Bilirubin: 0.6 mg/dL (ref 0.2–1.2)
Total Protein: 8 g/dL (ref 6.0–8.3)

## 2014-02-25 LAB — LIPID PANEL
CHOLESTEROL: 171 mg/dL (ref 0–200)
HDL: 33.9 mg/dL — ABNORMAL LOW (ref >39.00)
NonHDL: 137.1
Total CHOL/HDL Ratio: 5
Triglycerides: 278 mg/dL — ABNORMAL HIGH (ref 0.0–149.0)
VLDL: 55.6 mg/dL — ABNORMAL HIGH (ref 0.0–40.0)

## 2014-02-25 LAB — TSH: TSH: 4.7 u[IU]/mL — AB (ref 0.35–4.50)

## 2014-02-25 LAB — LDL CHOLESTEROL, DIRECT: Direct LDL: 95.8 mg/dL

## 2014-02-26 ENCOUNTER — Other Ambulatory Visit (INDEPENDENT_AMBULATORY_CARE_PROVIDER_SITE_OTHER): Payer: Medicare Other

## 2014-02-26 DIAGNOSIS — R945 Abnormal results of liver function studies: Secondary | ICD-10-CM

## 2014-02-26 DIAGNOSIS — E0590 Thyrotoxicosis, unspecified without thyrotoxic crisis or storm: Secondary | ICD-10-CM

## 2014-02-26 LAB — GAMMA GT: GGT: 579 U/L — ABNORMAL HIGH (ref 7–51)

## 2014-02-26 LAB — T4, FREE: FREE T4: 1.06 ng/dL (ref 0.60–1.60)

## 2014-02-26 NOTE — Progress Notes (Signed)
Quick Note:  Looks like I saw in consult at hospital only and was seen by MS before ______

## 2014-02-26 NOTE — Addendum Note (Signed)
Addended by: Avie Echevaria on: 02/26/2014 11:01 AM   Modules accepted: Orders

## 2014-02-26 NOTE — Telephone Encounter (Signed)
i was already aware of her PBC-- I forwarded labs to GI for there records -- she has seen Dr Fuller Plan and Lorayne Bender in past .

## 2014-03-03 ENCOUNTER — Other Ambulatory Visit: Payer: Self-pay | Admitting: Family Medicine

## 2014-03-03 DIAGNOSIS — E785 Hyperlipidemia, unspecified: Secondary | ICD-10-CM

## 2014-03-03 MED ORDER — PRAVASTATIN SODIUM 40 MG PO TABS: 40.0000 mg | ORAL_TABLET | Freq: Every day | ORAL | Status: DC

## 2014-03-03 NOTE — Telephone Encounter (Signed)
It was not documented that it was forwarded.      KP

## 2014-03-03 NOTE — Progress Notes (Signed)
Pt is seeing dr at [facility]

## 2014-03-04 ENCOUNTER — Telehealth: Payer: Self-pay | Admitting: Family Medicine

## 2014-03-04 MED ORDER — FENOFIBRATE 160 MG PO TABS: 160.0000 mg | ORAL_TABLET | Freq: Every day | ORAL | Status: DC

## 2014-03-04 NOTE — Telephone Encounter (Signed)
Caller name:Hubers, Apryle Relation to pt: self  Call back number:[phone]   Reason for call: pt states she is returning your call

## 2014-03-04 NOTE — Telephone Encounter (Signed)
Notes Recorded by Rudene Anda, RN on 03/04/2014 at 9:59 AM Called and left a message for call back. Notes Recorded by Rosalita Chessman, DO on 03/03/2014 at 2:44 PM Pt needs f/u with either [facility] or [facility]. Please ask pt who she is seeing for this now and [facility]. Thank you Notes Recorded by Ewing Schlein, CMA on 03/03/2014 at 1:35 PM Please review specialist notes and advise   KP Notes Recorded by Ladene Artist, MD on 02/26/2014 at 11:56 AM I think this patient transferred her GI care to Dr. Carlean Purl. Notes Recorded by Gatha Mayer, MD on 02/26/2014 at 10:37 AM Looks like I saw in consult at hospital only and was seen by [facility] before Notes Recorded by Ewing Schlein, CMA on 02/26/2014 at 9:10 AM T4 and GGT added to the labs.   KP Notes Recorded by Rosalita Chessman, DO on 02/25/2014 at 8:52 PM Why was fenofibrate stoppe? It was helping the triglycerides. We need to restart it if there were no side effects. Recheck 3 months  Lipid hep

## 2014-03-04 NOTE — Telephone Encounter (Addendum)
[facility] everywhere to find out who the patient see's for GI, she only sees Dr.Diehl at [facility] once a year.  I forwarded the information to the provider and made the patient aware to call and schedule a follow up, she said she would have her daughter call us with the appointment information. She has also agreed to restart the Fenofibrate. Rx faxed      KP

## 2014-06-11 ENCOUNTER — Encounter (HOSPITAL_BASED_OUTPATIENT_CLINIC_OR_DEPARTMENT_OTHER): Payer: Self-pay

## 2014-06-11 ENCOUNTER — Telehealth: Payer: Self-pay | Admitting: Medical

## 2014-06-11 ENCOUNTER — Ambulatory Visit (INDEPENDENT_AMBULATORY_CARE_PROVIDER_SITE_OTHER): Payer: Medicare Other | Admitting: Medical

## 2014-06-11 ENCOUNTER — Encounter: Payer: Self-pay | Admitting: Medical

## 2014-06-11 ENCOUNTER — Ambulatory Visit: Payer: Medicare Other | Admitting: Medical

## 2014-06-11 ENCOUNTER — Ambulatory Visit (HOSPITAL_BASED_OUTPATIENT_CLINIC_OR_DEPARTMENT_OTHER)
Admission: RE | Admit: 2014-06-11 | Discharge: 2014-06-11 | Disposition: A | Discharge status: Home / Self Care | Payer: Medicare Other | Source: Ambulatory Visit | Attending: Medical | Admitting: Medical

## 2014-06-11 VITALS — BP 125/69 | HR 90 | Temp 97.5°F | Ht 68.0 in | Wt 201.8 lb

## 2014-06-11 DIAGNOSIS — R109 Unspecified abdominal pain: Secondary | ICD-10-CM | POA: Insufficient documentation

## 2014-06-11 DIAGNOSIS — E785 Hyperlipidemia, unspecified: Secondary | ICD-10-CM | POA: Diagnosis not present

## 2014-06-11 DIAGNOSIS — R1084 Generalized abdominal pain: Secondary | ICD-10-CM

## 2014-06-11 DIAGNOSIS — K5900 Constipation, unspecified: Secondary | ICD-10-CM | POA: Diagnosis not present

## 2014-06-11 DIAGNOSIS — I77810 Thoracic aortic ectasia: Secondary | ICD-10-CM

## 2014-06-11 LAB — COMPREHENSIVE METABOLIC PANEL
ALT: 16 U/L (ref 0–35)
AST: 26 U/L (ref 0–37)
Albumin: 3.9 g/dL (ref 3.5–5.2)
Alkaline Phosphatase: 135 U/L — ABNORMAL HIGH (ref 39–117)
BUN: 32 mg/dL — ABNORMAL HIGH (ref 6–23)
CO2: 28 mEq/L (ref 19–32)
Calcium: 10.3 mg/dL (ref 8.4–10.5)
Chloride: 101 mEq/L (ref 96–112)
Creatinine, Ser: 1.21 mg/dL — ABNORMAL HIGH (ref 0.40–1.20)
GFR: 45.19 mL/min — ABNORMAL LOW (ref >60.00)
Glucose, Bld: 119 mg/dL — ABNORMAL HIGH (ref 70–99)
Potassium: 4.2 mEq/L (ref 3.5–5.1)
Sodium: 136 mEq/L (ref 135–145)
Total Bilirubin: 0.9 mg/dL (ref 0.2–1.2)
Total Protein: 8.4 g/dL — ABNORMAL HIGH (ref 6.0–8.3)

## 2014-06-11 LAB — CBC WITH DIFFERENTIAL/PLATELET
BASOS ABS: 0 10*3/uL (ref 0.0–0.1)
Basophils Relative: 0.5 % (ref 0.0–3.0)
EOS PCT: 0.1 % (ref 0.0–5.0)
Eosinophils Absolute: 0 10*3/uL (ref 0.0–0.7)
HCT: 43.6 % (ref 36.0–46.0)
HEMOGLOBIN: 14.6 g/dL (ref 12.0–15.0)
Lymphocytes Relative: 29.3 % (ref 12.0–46.0)
Lymphs Abs: 2.7 10*3/uL (ref 0.7–4.0)
MCHC: 33.5 g/dL (ref 30.0–36.0)
MCV: 85.6 fl (ref 78.0–100.0)
Monocytes Absolute: 0.8 10*3/uL (ref 0.1–1.0)
Monocytes Relative: 8.2 % (ref 3.0–12.0)
NEUTROS ABS: 5.7 10*3/uL (ref 1.4–7.7)
NEUTROS PCT: 61.9 % (ref 43.0–77.0)
Platelets: 288 10*3/uL (ref 150.0–400.0)
RBC: 5.1 Mil/uL (ref 3.87–5.11)
RDW: 14.7 % (ref 11.5–15.5)
WBC: 9.1 10*3/uL (ref 4.0–10.5)

## 2014-06-11 LAB — LIPASE: Lipase: 11 U/L (ref 11.0–59.0)

## 2014-06-11 MED ORDER — IOHEXOL 300 MG/ML  SOLN
100.0000 mL | Freq: Once | INTRAMUSCULAR | Status: AC | PRN
  Administered 2014-06-11: 80 mL via INTRAVENOUS

## 2014-06-11 MED ORDER — PRAVASTATIN SODIUM 40 MG PO TABS: 40.0000 mg | ORAL_TABLET | Freq: Every day | ORAL | Status: DC

## 2014-06-11 NOTE — Telephone Encounter (Signed)
Referral to Dr. Donnetta Hutching on suprarenal abdominal aorta dilation. Pt had constipation for 4 days then bm before ct. Her abdomen pain resolved but CT did show suprrenal abdominal aorta dilation from 3.5 cm 04/20/2013. To Now 4.0 cm today 06/10/2013.     .5cm difference in just short of 2 months. Would like to refer. But in light of rapid change does she need to be seen quickly. Will you run this by his nurse. See if she will talk to Dr. Donnetta Hutching and ask him. Pt had aorto iliac graft in the past. Please let me know by tomorrow what they say.

## 2014-06-11 NOTE — Progress Notes (Signed)
Subjective:    Patient ID: Deanna [name], female    DOB: Dec 17, 1929, 81 y.o.   MRN: [id number]  HPI   Pt in states she feels nausea, fatigued, and not sleeping well.(Then describes no bm for 4 days. With some abdomen cramping at time)   No fever, no chills and no vomiting.   No fever temp 97.5 Family states low grade  fever.  Decreased appetite.   Pt states no BM for 4 days.(Some abdominal pain and cramping) Hx of daily bm in past. Several times a day. Some pain in her rt groin on and off. None presently.  Pt had anuerysm. Repair 4 days. No other abdominal surgeries. Hx of ischemic colitis years ago.(But did not have any surgery for that)  No black of blood stools.   Some back pain as well on exam when press on stomach.      Review of Systems  Constitutional: Negative for fever and chills.       Daughter thinks low grade fever.  Respiratory: Negative for apnea, cough, chest tightness and wheezing.   Cardiovascular: Negative for chest pain and palpitations.  Gastrointestinal: Positive for abdominal pain. Negative for nausea, vomiting, blood in stool, abdominal distention, anal bleeding and rectal pain.       Some cramps.  Musculoskeletal: Negative for back pain.  Neurological: Negative for dizziness, facial asymmetry, speech difficulty and light-headedness.  Hematological: Negative for adenopathy. Does not bruise/bleed easily.   Past Medical History  Diagnosis Date  . HLD (hyperlipidemia)   . HTN (hypertension)   . AAA (abdominal aortic aneurysm) 2011    Dr Early repaired this  . Cirrhosis, biliary     liver bx 01/2008  . Fatigue   . Epistaxis   . OSA (obstructive sleep apnea)     mild on ss of 12/2008  . Leg pain     bilateral  . GERD (gastroesophageal reflux disease)   . Pneumonia   . Bleeding ulcer 1980s    H. Pylori  . Acute ischemic colitis 03/06/2012  . Anemia   . Arthritis     bilateral hands and knees  . Blood transfusion without reported diagnosis     . Cataract     In the past  . Diastolic CHF 10/27/3662    History   Social History  . Marital Status: Widowed    Spouse Name: N/A  . Number of Children: N/A  . Years of Education: N/A   Occupational History  . Not on file.   Social History Main Topics  . Smoking status: Former Smoker -- 52 years    Types: Cigarettes    Quit date: 02/01/2010  . Smokeless tobacco: Not on file  . Alcohol Use: No  . Drug Use: No  . Sexual Activity: Not Currently   Other Topics Concern  . Not on file   Social History Narrative    Past Surgical History  Procedure Laterality Date  . 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 Cataract extraction    . Breast biopsy    . Colonoscopy  03/08/2012    Procedure: COLONOSCOPY;  Surgeon: Gatha Mayer, MD;  Location: [facility];  Service: Endoscopy;  Laterality: N/A;  . Abdominal aortic aneurysm repair  02/01/2010  . Ercp N/A 04/20/2013    Procedure: ENDOSCOPIC RETROGRADE CHOLANGIOPANCREATOGRAPHY (ERCP);  Surgeon: Ladene Artist, MD;  Location: [facility];  Service: Endoscopy;  Laterality: N/A;    Family History  Problem Relation Age of Onset  . Cancer Brother     prostate    Allergies  Allergen Reactions  . Crestor [Rosuvastatin]   . Ezetimibe Other (See Comments)    Current Outpatient Prescriptions on File Prior to Visit  Medication Sig Dispense Refill  . aspirin 81 MG tablet Take 81 mg by mouth daily.    . diclofenac sodium (VOLTAREN) 1 % GEL Apply 2 g topically 3 (three) times daily as needed (knee pain and swelling). 100 g 5  . fenofibrate 160 MG tablet Take 1 tablet (160 mg total) by mouth daily. 30 tablet 2  . IRON PO Take 1 tablet by mouth daily.    Marland Kitchen losartan (COZAAR) 50 MG tablet Take 1 tablet (50 mg total) by mouth daily. 90 tablet 3  . Vitamin E Water Soluble 1000 UNITS CAPS Take by mouth.     No current facility-administered medications on file prior to visit.    BP 125/69 mmHg  Pulse 90  Temp(Src)  97.5 F (36.4 C) (Oral)  Ht 5\' 8"  (1.727 m)  Wt 201 lb 12.8 oz (91.536 kg)  BMI 30.69 kg/m2  SpO2 98%       Objective:   Physical Exam   General Appearance- Not in acute distress.  HEENT Eyes- Scleraeral/Conjuntiva-bilat- Not Yellow. Mouth & Throat- Normal.  Chest and Lung Exam Auscultation: Breath sounds:-Normal.(Clear even and unlabored) Adventitious sounds:- No Adventitious sounds.  Cardiovascular Auscultation:Rythm - Regular. Heart Sounds -Normal heart sounds.  Abdomen Inspection:-Inspection Normal.  Palpation/Perucssion: Palpation and Percussion of the abdomen reveal- faint mid and llq Tender(some reported back pain on palpation abdomen as well), No Rebound tenderness, No rigidity(Guarding) and No Palpable abdominal masses.  Some decreased bowel sounds. Liver:-Normal.  Spleen:- Normal.   Rectal Anorectal Exam: Stool - Hemoccult of stool/mucous is Heme Negative. External - normal external exam. Internal - normal sphincter tone. No rectal mass. Moderate amount of stool in rectum.   Back- no cva tenderness.  Neuro-  CNII- XII grossly intact.    Assessment & Plan:

## 2014-06-11 NOTE — Progress Notes (Signed)
Pre visit review using our clinic review tool, if applicable. No additional management support is needed unless otherwise documented below in the visit note. 

## 2014-06-11 NOTE — Telephone Encounter (Signed)
Talked with radiologist (06-11-2014 at 6:20)regarding transverse colon features on CT. No worrisome Ct finding regarding tranverse colon and ventral hernia.

## 2014-06-11 NOTE — Assessment & Plan Note (Signed)
Note pt came back up after Ct scan. I wanted to re-examen and correlate CT report to repeat exam. Pt told me before ct she had good bm. And all her symptoms cleared. Exam was completley normal on recheck.   I advised her if symptoms change or worsen notify us. Any severe pain then ED.

## 2014-06-11 NOTE — Patient Instructions (Addendum)
Pain in the abdomen Will get stat cmp, cbc, and lipase today. Then schedule for abdomen/pelvis with contrast.   Will do this do to 4 days no BM movement.  Could also get miralax otc and try enema otc.  Pending lab results and scheduling of ct pelvis if any acute severe change of signs and symptoms then ED eval.      Constipation vs obstruction. Work up pending.  Follow up 7 days or as needed.  Daughter [phone]   Also refill of pravachol. No side effects. Can get fasting labs done later. Did not get lipid panel today.

## 2014-06-11 NOTE — Assessment & Plan Note (Addendum)
Will get stat cmp, cbc, and lipase today. Then schedule for abdomen/pelvis with contrast.   Will do this do to 4 days no BM movement.  Could also get miralax otc and try enema otc(To clear constipation).  Pending lab results and scheduling of ct  Abdomen /pelvis(evaluate obsruction) if any acute severe change of signs and symptoms then ED eval.

## 2014-06-11 NOTE — Telephone Encounter (Signed)
Marji made a call into to [facility]/awaiting return call

## 2014-06-12 ENCOUNTER — Encounter: Payer: Self-pay | Admitting: Family Medicine

## 2014-06-12 MED ORDER — FENOFIBRATE 160 MG PO TABS: 160.0000 mg | ORAL_TABLET | Freq: Every day | ORAL | Status: DC

## 2014-06-12 NOTE — Telephone Encounter (Signed)
Med filled.  

## 2014-06-16 ENCOUNTER — Encounter: Payer: Self-pay | Admitting: Vascular Surgery

## 2014-06-17 ENCOUNTER — Other Ambulatory Visit: Payer: Self-pay

## 2014-06-17 ENCOUNTER — Ambulatory Visit (INDEPENDENT_AMBULATORY_CARE_PROVIDER_SITE_OTHER): Payer: Medicare Other | Admitting: Vascular Surgery

## 2014-06-17 ENCOUNTER — Encounter: Payer: Self-pay | Admitting: Vascular Surgery

## 2014-06-17 VITALS — BP 129/85 | HR 83 | Resp 16 | Ht 68.0 in | Wt 204.0 lb

## 2014-06-17 DIAGNOSIS — I716 Thoracoabdominal aortic aneurysm, without rupture, unspecified: Secondary | ICD-10-CM

## 2014-06-17 DIAGNOSIS — I714 Abdominal aortic aneurysm, without rupture, unspecified: Secondary | ICD-10-CM

## 2014-06-17 NOTE — Progress Notes (Signed)
Patient name: Deanna [name] MRN: [id number] DOB: 1930-01-18 Sex: female   Referred by: Tora Kindred  Reason for referral:  Chief Complaint  Patient presents with  . New Evaluation    abdominal aorta dilatation  referred by Dr Etter Sjogren    HISTORY OF PRESENT ILLNESS: Patient is today for discussion of recent CT scan. She was having abdominal discomfort and underwent CT scan on 06/11/2014. 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 This was compared to 3.5 cm in January 2015. There are. There was some confusion regarding the CT scan because was felt that she had had a rapid 0.5 cm expansion in 2 months. She therefore was referred for urgent discussion of this. The patient is here today with her daughter. She is very well-known to me from a prior open infrarenal aneurysm repair in 2011 for which she has recovered quite nicely. I'd seen her just over a year ago with CAT scan for evaluation of potential mesenteric ischemia. She was having episodes of her stool. This is all completely resolved. He does remain quite active at her age of 56.  Past Medical History  Diagnosis Date  . HLD (hyperlipidemia)   . HTN (hypertension)   . AAA (abdominal aortic aneurysm) 2011    Dr Tyniah Kastens repaired this  . Cirrhosis, biliary     liver bx 01/2008  . Fatigue   . Epistaxis   . OSA (obstructive sleep apnea)     mild on ss of 12/2008  . Leg pain     bilateral  . GERD (gastroesophageal reflux disease)   . Pneumonia   . Bleeding ulcer 1980s    H. Pylori  . Acute ischemic colitis 03/06/2012  . Anemia   . Arthritis     bilateral hands and knees  . Blood transfusion without reported diagnosis   . Cataract     In the past  . Diastolic CHF 7/51/7001    Past Surgical History  Procedure Laterality Date  . 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 Cataract extraction    . Breast biopsy    . Colonoscopy  03/08/2012    Procedure: COLONOSCOPY;  Surgeon:  Gatha Mayer, MD;  Location: [place];  Service: Endoscopy;  Laterality: N/A;  . Abdominal aortic aneurysm repair  02/01/2010  . Ercp N/A 04/20/2013    Procedure: ENDOSCOPIC RETROGRADE CHOLANGIOPANCREATOGRAPHY (ERCP);  Surgeon: Ladene Artist, MD;  Location: [facility];  Service: Endoscopy;  Laterality: N/A;    History   Social History  . Marital Status: Widowed    Spouse Name: N/A  . Number of Children: N/A  . Years of Education: N/A   Occupational History  . Not on file.   Social History Main Topics  . Smoking status: Former Smoker -- 52 years    Types: Cigarettes    Quit date: 02/01/2010  . Smokeless tobacco: Never Used  . Alcohol Use: No  . Drug Use: No  . Sexual Activity: Not Currently   Other Topics Concern  . Not on file   Social History Narrative    Family History  Problem Relation Age of Onset  . Cancer Brother     prostate    Allergies as of 06/17/2014 - Review Complete 06/17/2014  Allergen Reaction Noted  . Crestor [rosuvastatin]  05/06/2013  . Ezetimibe Other (See Comments) 05/06/2013    Current Outpatient Prescriptions on File Prior to Visit  Medication Sig Dispense Refill  . aspirin 81 MG tablet Take 81 mg by mouth daily.    . diclofenac sodium (VOLTAREN) 1 % GEL Apply 2 g topically 3 (three) times daily as needed (knee pain and swelling). 100 g 5  . fenofibrate 160 MG tablet Take 1 tablet (160 mg total) by mouth daily. 30 tablet 2  . IRON PO Take 1 tablet by mouth daily.    Marland Kitchen losartan (COZAAR) 50 MG tablet Take 1 tablet (50 mg total) by mouth daily. 90 tablet 3  . pravastatin (PRAVACHOL) 40 MG tablet Take 1 tablet (40 mg total) by mouth daily. 30 tablet 3  . Vitamin E Water Soluble 1000 UNITS CAPS Take by mouth.     No current facility-administered medications on file prior to visit.      PHYSICAL EXAMINATION:  General: The patient is a well-nourished female, in no acute distress. Vital signs are BP 129/85 mmHg  Pulse 83  Resp 16  Ht  5\' 8"  (1.727 m)  Wt 204 lb (92.534 kg)  BMI 31.03 kg/m2 Pulmonary: There is a good air exchange bilaterally  Abdomen: Soft and non-tender with normal pitch bowel sounds. No aneurysm noted. No hernia noted. Musculoskeletal: There are no major deformities.  There is no significant extremity pain. Neurologic: No focal weakness or paresthesias are detected, Skin: There are no ulcer or rashes noted. Psychiatric: The patient has normal affect. Cardiovascular: There is a regular rate and rhythm without significant murmur appreciated. Pulse status: 2+ radial 2+ femoral pulses bilaterally. Carotid arteries without bruits bilaterally  I reviewed her CT scan and discussed these with the patient. This shows slight dilatation of her suprarenal aorta at 4.0 cm.  Impression and Plan:  Slight increase in suprarenal aortic aneurysm and 14 months from 0.352 4.0 cm. I discussed the expected slow continued growth of this. Explained that she is at the very minimal risk of rupture with a small suprarenal aorta. Explained the magnitude of repair of this. She reports that she does not want to consider repair. I have offered either one year CT follow-up or discontinuation follow-up. She feels more comfortable following this to assure no rapid expansion. I did discuss symptoms of leaking aneurysm and she knows report immediately to the emergency room should this occur. 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

## 2014-09-29 ENCOUNTER — Other Ambulatory Visit: Payer: Self-pay

## 2014-10-08 ENCOUNTER — Other Ambulatory Visit: Payer: Self-pay | Admitting: Family Medicine

## 2014-11-27 ENCOUNTER — Encounter: Payer: Self-pay | Admitting: Family Medicine

## 2014-11-27 ENCOUNTER — Ambulatory Visit (INDEPENDENT_AMBULATORY_CARE_PROVIDER_SITE_OTHER): Payer: Medicare Other | Admitting: Family Medicine

## 2014-11-27 VITALS — BP 124/62 | HR 95 | Temp 97.9°F | Ht 68.0 in | Wt 207.2 lb

## 2014-11-27 DIAGNOSIS — M1710 Unilateral primary osteoarthritis, unspecified knee: Secondary | ICD-10-CM

## 2014-11-27 DIAGNOSIS — E785 Hyperlipidemia, unspecified: Secondary | ICD-10-CM | POA: Diagnosis not present

## 2014-11-27 DIAGNOSIS — M179 Osteoarthritis of knee, unspecified: Secondary | ICD-10-CM

## 2014-11-27 DIAGNOSIS — IMO0002 Reserved for concepts with insufficient information to code with codable children: Secondary | ICD-10-CM

## 2014-11-27 DIAGNOSIS — I10 Essential (primary) hypertension: Secondary | ICD-10-CM | POA: Diagnosis not present

## 2014-11-27 DIAGNOSIS — R413 Other amnesia: Secondary | ICD-10-CM

## 2014-11-27 DIAGNOSIS — N390 Urinary tract infection, site not specified: Secondary | ICD-10-CM

## 2014-11-27 LAB — POCT URINALYSIS DIPSTICK
Bilirubin, UA: NEGATIVE
GLUCOSE UA: NEGATIVE
Ketones, UA: NEGATIVE
NITRITE UA: POSITIVE
SPEC GRAV UA: 1.025
UROBILINOGEN UA: 2
pH, UA: 6

## 2014-11-27 MED ORDER — CELECOXIB 200 MG PO CAPS: 200.0000 mg | ORAL_CAPSULE | Freq: Two times a day (BID) | ORAL | Status: DC

## 2014-11-27 MED ORDER — CIPROFLOXACIN HCL 500 MG PO TABS: 500.0000 mg | ORAL_TABLET | Freq: Two times a day (BID) | ORAL | Status: DC

## 2014-11-27 NOTE — Progress Notes (Signed)
Pre visit review using our clinic review tool, if applicable. No additional management support is needed unless otherwise documented below in the visit note. 

## 2014-11-27 NOTE — Patient Instructions (Signed)

## 2014-11-27 NOTE — Progress Notes (Signed)
Patient ID: Deanna [name], female   DOB: December 12, 1929, 81 y.o.   MRN: [id number]   Subjective:    Patient ID: Deanna [name], female    DOB: 10/10/1929, 81 y.o.   MRN: [id number]  Chief Complaint  Patient presents with  . Follow-up    Treated for UTI on a cruise-- Fasting    HPI Patient is in today f/u UTI .  She was treated for a uti on the cruise ship but still does not feel good.  She also needs refill on celebrex.    Past Medical History  Diagnosis Date  . HLD (hyperlipidemia)   . HTN (hypertension)   . AAA (abdominal aortic aneurysm) 2011    Dr Early repaired this  . Cirrhosis, biliary     liver bx 01/2008  . Fatigue   . Epistaxis   . OSA (obstructive sleep apnea)     mild on ss of 12/2008  . Leg pain     bilateral  . GERD (gastroesophageal reflux disease)   . Pneumonia   . Bleeding ulcer 1980s    H. Pylori  . Acute ischemic colitis 03/06/2012  . Anemia   . Arthritis     bilateral hands and knees  . Blood transfusion without reported diagnosis   . Cataract     In the past  . Diastolic CHF 0/37/0488    Past Surgical History  Procedure Laterality Date  . 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 Cataract extraction    . Breast biopsy    . Colonoscopy  03/08/2012    Procedure: COLONOSCOPY;  Surgeon: Gatha Mayer, MD;  Location: [place];  Service: Endoscopy;  Laterality: N/A;  . Abdominal aortic aneurysm repair  02/01/2010  . Ercp N/A 04/20/2013    Procedure: ENDOSCOPIC RETROGRADE CHOLANGIOPANCREATOGRAPHY (ERCP);  Surgeon: Ladene Artist, MD;  Location: [facility];  Service: Endoscopy;  Laterality: N/A;    Family History  Problem Relation Age of Onset  . Cancer Brother     prostate    Social History   Social History  . Marital Status: Widowed    Spouse Name: N/A  . Number of Children: N/A  . Years of Education: N/A   Occupational History  . Not on file.   Social History Main Topics  . Smoking status: Former Smoker -- 52 years    Types: Cigarettes    Quit date: 02/01/2010  . Smokeless tobacco: Never Used  . Alcohol Use: No  . Drug Use: No  . Sexual Activity: Not Currently   Other Topics Concern  . Not on file   Social History Narrative    Outpatient Prescriptions Prior to Visit  Medication Sig Dispense Refill  . aspirin 81 MG tablet Take 81 mg by mouth daily.    . diclofenac sodium (VOLTAREN) 1 % GEL Apply 2 g topically 3 (three) times daily as needed (knee pain and swelling). 100 g 5  . fenofibrate 160 MG tablet TAKE ONE TABLET BY MOUTH ONCE DAILY 30 tablet 2  . losartan (COZAAR) 50 MG tablet Take 1 tablet (50 mg total) by mouth daily. 90 tablet 3  . IRON PO Take 1 tablet by mouth daily.    . pravastatin (PRAVACHOL) 40 MG tablet Take 1 tablet (40 mg total) by mouth daily. 30 tablet 3  . Vitamin E Water Soluble 1000 UNITS CAPS Take by mouth.     No facility-administered medications prior to visit.  Allergies  Allergen Reactions  . Crestor [Rosuvastatin]   . Ezetimibe Other (See Comments)    Review of Systems  Constitutional: Negative for fever and malaise/fatigue.  HENT: Negative for congestion.   Eyes: Negative for discharge.  Respiratory: Negative for shortness of breath.   Cardiovascular: Negative for chest pain, palpitations and leg swelling.  Gastrointestinal: Negative for nausea and abdominal pain.  Genitourinary: Negative for dysuria.  Musculoskeletal: Negative for falls.  Skin: Negative for rash.  Neurological: Negative for loss of consciousness and headaches.  Endo/Heme/Allergies: Negative for environmental allergies.  Psychiatric/Behavioral: Negative for depression. The patient is not nervous/anxious.        Objective:    Physical Exam  Constitutional: She is oriented to person, place, and time. She appears well-developed and well-nourished.  HENT:  Head: Normocephalic and atraumatic.  Eyes: Conjunctivae and EOM are normal.  Neck: Normal range of motion. Neck supple. No JVD  present. Carotid bruit is not present. No thyromegaly present.  Cardiovascular: Normal rate, regular rhythm and normal heart sounds.   No murmur heard. Pulmonary/Chest: Effort normal and breath sounds normal. No respiratory distress. She has no wheezes. She has no rales. She exhibits no tenderness.  Musculoskeletal: She exhibits no edema.  Neurological: She is alert and oriented to person, place, and time.  Psychiatric: She has a normal mood and affect. Her behavior is normal.    BP 124/62 mmHg  Pulse 95  Temp(Src) 97.9 F (36.6 C) (Oral)  Ht 5\' 8"  (1.727 m)  Wt 207 lb 3.2 oz (93.985 kg)  BMI 31.51 kg/m2  SpO2 97% Wt Readings from Last 3 Encounters:  11/27/14 207 lb 3.2 oz (93.985 kg)  06/17/14 204 lb (92.534 kg)  06/11/14 201 lb 12.8 oz (91.536 kg)     Lab Results  Component Value Date   WBC 9.1 06/11/2014   HGB 14.6 06/11/2014   HCT 43.6 06/11/2014   PLT 288.0 06/11/2014   GLUCOSE 97 11/27/2014   CHOL 163 11/27/2014   TRIG 201.0* 11/27/2014   HDL 28.80* 11/27/2014   LDLDIRECT 94.0 11/27/2014   LDLCALC 62 07/16/2012   ALT 12 11/27/2014   AST 21 11/27/2014   NA 136 11/27/2014   K 4.2 11/27/2014   CL 101 11/27/2014   CREATININE 1.19 11/27/2014   BUN 24* 11/27/2014   CO2 27 11/27/2014   TSH 4.70* 02/24/2014   INR 1.15 04/20/2013   HGBA1C 5.5 06/24/2011    Lab Results  Component Value Date   TSH 4.70* 02/24/2014   Lab Results  Component Value Date   WBC 9.1 06/11/2014   HGB 14.6 06/11/2014   HCT 43.6 06/11/2014   MCV 85.6 06/11/2014   PLT 288.0 06/11/2014   Lab Results  Component Value Date   NA 136 11/27/2014   K 4.2 11/27/2014   CO2 27 11/27/2014   GLUCOSE 97 11/27/2014   BUN 24* 11/27/2014   CREATININE 1.19 11/27/2014   BILITOT 0.6 11/27/2014   ALKPHOS 72 11/27/2014   AST 21 11/27/2014   ALT 12 11/27/2014   PROT 7.6 11/27/2014   ALBUMIN 3.5 11/27/2014   CALCIUM 9.8 11/27/2014   GFR 46.02* 11/27/2014   Lab Results  Component Value Date    CHOL 163 11/27/2014   Lab Results  Component Value Date   HDL 28.80* 11/27/2014   Lab Results  Component Value Date   LDLCALC 62 07/16/2012   Lab Results  Component Value Date   TRIG 201.0* 11/27/2014   Lab Results  Component Value  Date   CHOLHDL 6 11/27/2014   Lab Results  Component Value Date   HGBA1C 5.5 06/24/2011       Assessment & Plan:   Problem List Items Addressed This Visit    Hyperlipidemia   Relevant Orders   Comprehensive metabolic panel (Completed)   Lipid panel (Completed)   LDL cholesterol, direct (Completed)   Essential hypertension   Relevant Orders   Comprehensive metabolic panel (Completed)   Lipid panel (Completed)    Other Visit Diagnoses    Urinary tract infection without hematuria, site unspecified    -  Primary    Relevant Orders    POCT Urinalysis Dipstick (Completed)    Urine Culture (Completed)    Memory loss        Relevant Orders    Vitamin B12 (Completed)    Osteoarthrosis, unspecified whether generalized or localized, involving lower leg        Relevant Medications    celecoxib (CELEBREX) 200 MG capsule       I have discontinued Ms. Rattigan's IRON PO, Vitamin E Water Soluble, and pravastatin. I am also having her start on ciprofloxacin. Additionally, I am having her maintain her aspirin, diclofenac sodium, losartan, fenofibrate, and celecoxib.  Meds ordered this encounter  Medications  . ciprofloxacin (CIPRO) 500 MG tablet    Sig: Take 1 tablet (500 mg total) by mouth 2 (two) times daily.    Dispense:  10 tablet    Refill:  0  . DISCONTD: celecoxib (CELEBREX) 200 MG capsule    Sig: Take 1 capsule (200 mg total) by mouth 2 (two) times daily.    Dispense:  30 capsule    Refill:  o  . celecoxib (CELEBREX) 200 MG capsule    Sig: Take 1 capsule (200 mg total) by mouth 2 (two) times daily.    Dispense:  30 capsule    Refill:  0     Garnet Koyanagi, DO

## 2014-11-28 LAB — COMPREHENSIVE METABOLIC PANEL
ALT: 12 U/L (ref 0–35)
AST: 21 U/L (ref 0–37)
Albumin: 3.5 g/dL (ref 3.5–5.2)
Alkaline Phosphatase: 72 U/L (ref 39–117)
BUN: 24 mg/dL — AB (ref 6–23)
CO2: 27 meq/L (ref 19–32)
CREATININE: 1.19 mg/dL (ref 0.40–1.20)
Calcium: 9.8 mg/dL (ref 8.4–10.5)
Chloride: 101 mEq/L (ref 96–112)
GFR: 46.02 mL/min — ABNORMAL LOW (ref >60.00)
Glucose, Bld: 97 mg/dL (ref 70–99)
Potassium: 4.2 mEq/L (ref 3.5–5.1)
Sodium: 136 mEq/L (ref 135–145)
TOTAL PROTEIN: 7.6 g/dL (ref 6.0–8.3)
Total Bilirubin: 0.6 mg/dL (ref 0.2–1.2)

## 2014-11-28 LAB — LIPID PANEL
Cholesterol: 163 mg/dL (ref 0–200)
HDL: 28.8 mg/dL — ABNORMAL LOW (ref >39.00)
NONHDL: 134.15
Total CHOL/HDL Ratio: 6
Triglycerides: 201 mg/dL — ABNORMAL HIGH (ref 0.0–149.0)
VLDL: 40.2 mg/dL — ABNORMAL HIGH (ref 0.0–40.0)

## 2014-11-28 LAB — VITAMIN B12: Vitamin B-12: 540 pg/mL (ref 211–911)

## 2014-11-28 LAB — LDL CHOLESTEROL, DIRECT: LDL DIRECT: 94 mg/dL

## 2014-12-01 LAB — URINE CULTURE

## 2014-12-05 ENCOUNTER — Other Ambulatory Visit: Payer: Self-pay

## 2014-12-05 MED ORDER — NITROFURANTOIN MONOHYD MACRO 100 MG PO CAPS: 100.0000 mg | ORAL_CAPSULE | Freq: Two times a day (BID) | ORAL | Status: DC

## 2014-12-20 ENCOUNTER — Other Ambulatory Visit: Payer: Self-pay | Admitting: Family Medicine

## 2015-01-11 ENCOUNTER — Encounter: Payer: Self-pay | Admitting: Family Medicine

## 2015-01-12 ENCOUNTER — Other Ambulatory Visit: Payer: Self-pay | Admitting: Family Medicine

## 2015-01-12 DIAGNOSIS — M1710 Unilateral primary osteoarthritis, unspecified knee: Secondary | ICD-10-CM

## 2015-01-12 DIAGNOSIS — IMO0002 Reserved for concepts with insufficient information to code with codable children: Secondary | ICD-10-CM

## 2015-01-12 MED ORDER — CELECOXIB 200 MG PO CAPS: 200.0000 mg | ORAL_CAPSULE | Freq: Two times a day (BID) | ORAL | Status: DC

## 2015-03-19 ENCOUNTER — Telehealth: Payer: Self-pay | Admitting: Family Medicine

## 2015-03-19 NOTE — Telephone Encounter (Signed)
Left message for patient to call about flu shot °

## 2015-03-19 NOTE — Telephone Encounter (Signed)
Patient had Flu Shot in September @ [facility]

## 2015-03-19 NOTE — Telephone Encounter (Signed)
HM updated.      KP

## 2015-04-01 ENCOUNTER — Ambulatory Visit (INDEPENDENT_AMBULATORY_CARE_PROVIDER_SITE_OTHER): Payer: Medicare Other | Admitting: Internal Medicine

## 2015-04-01 ENCOUNTER — Other Ambulatory Visit: Payer: Medicare Other

## 2015-04-01 ENCOUNTER — Encounter: Payer: Self-pay | Admitting: Internal Medicine

## 2015-04-01 VITALS — BP 124/62 | HR 91 | Temp 98.6°F | Resp 16 | Wt 209.0 lb

## 2015-04-01 DIAGNOSIS — R3915 Urgency of urination: Secondary | ICD-10-CM | POA: Diagnosis not present

## 2015-04-01 DIAGNOSIS — J069 Acute upper respiratory infection, unspecified: Secondary | ICD-10-CM

## 2015-04-01 DIAGNOSIS — N390 Urinary tract infection, site not specified: Secondary | ICD-10-CM

## 2015-04-01 DIAGNOSIS — I10 Essential (primary) hypertension: Secondary | ICD-10-CM

## 2015-04-01 DIAGNOSIS — M1710 Unilateral primary osteoarthritis, unspecified knee: Secondary | ICD-10-CM

## 2015-04-01 DIAGNOSIS — IMO0002 Reserved for concepts with insufficient information to code with codable children: Secondary | ICD-10-CM

## 2015-04-01 LAB — POCT URINALYSIS DIPSTICK
BILIRUBIN UA: NEGATIVE
Blood, UA: 10
GLUCOSE UA: NEGATIVE
Ketones, UA: NEGATIVE
NITRITE UA: NEGATIVE
Spec Grav, UA: >=1.03
Urobilinogen, UA: 0.2
pH, UA: 6

## 2015-04-01 MED ORDER — CELECOXIB 200 MG PO CAPS: 200.0000 mg | ORAL_CAPSULE | Freq: Two times a day (BID) | ORAL | Status: DC

## 2015-04-01 MED ORDER — FENOFIBRATE 160 MG PO TABS: 160.0000 mg | ORAL_TABLET | Freq: Every day | ORAL | Status: DC

## 2015-04-01 MED ORDER — LOSARTAN POTASSIUM 50 MG PO TABS: 50.0000 mg | ORAL_TABLET | Freq: Every day | ORAL | Status: DC

## 2015-04-01 MED ORDER — CEPHALEXIN 500 MG PO CAPS: 500.0000 mg | ORAL_CAPSULE | Freq: Two times a day (BID) | ORAL | Status: DC

## 2015-04-01 NOTE — Progress Notes (Signed)
Subjective:    Patient ID: Deanna [name], female    DOB: 05/20/1929, 81 y.o.   MRN: [id number]  HPI She is here for an acute visit for a possible UTI.  She is also having cold symptoms   Her symptoms started three days ago.  She started having dizziness, cough with yellow -green phlegm, generalized weakness, and nasal congestion. She has also had a sore throat, subjective fever and decreased appetite.  She has a history of urinary tract infections in her daughter also thinks she may have 1 now. She has had some increased urinary urgency and has experienced incontinence, which is not her baseline. These are symptoms she has experienced with previous urinary tract infections. She denies any increased urinary frequency, dysuria or hematuria.  Medications and allergies reviewed with patient and updated if appropriate.  Patient Active Problem List   Diagnosis Date Noted  . Pain in the abdomen 06/11/2014  . Constipation 06/11/2014  . Preop cardiovascular exam 05/24/2013  . Obesity (BMI 30-39.9) 05/12/2013  . Pulmonary hypertension ([place]) 04/21/2013  . Diastolic CHF ([place]) [id number]  . Acute cholangitis 04/20/2013  . Dyspnea on exertion 04/20/2013  . Calculus of bile duct without mention of cholecystitis or obstruction 04/20/2013  . Nonspecific (abnormal) findings on radiological and other examination of biliary tract 04/20/2013  . Celiac artery stenosis ([place]) 05/01/2012  . Acute ischemic colitis ([place]) 03/06/2012  . Hyperkalemia 03/06/2012  . Dehydration 03/06/2012  . Leukocytosis 03/06/2012  . Leg weakness, bilateral 01/01/2012  . HYPERGLYCEMIA 06/15/2010  . SMOKER 01/29/2010  . MURMUR 01/29/2010  . ELECTROCARDIOGRAM, ABNORMAL 01/29/2010  . BILIARY CIRRHOSIS, PRIMARY 01/08/2010  . FATIGUE 06/26/2009  . EPISTAXIS 06/26/2009  . OBSTRUCTIVE SLEEP APNEA 12/19/2008  . LEG PAIN, BILATERAL 04/08/2008  . GERD 12/27/2007  . ALANINE AMINOTRANSFERASE, SERUM, ELEVATED 12/05/2007  .  Nonspecific abnormal results of liver function study 07/04/2007  . Hyperlipidemia 06/13/2007  . Essential hypertension 11/06/2006    Current Outpatient Prescriptions on File Prior to Visit  Medication Sig Dispense Refill  . celecoxib (CELEBREX) 200 MG capsule Take 1 capsule (200 mg total) by mouth 2 (two) times daily. 30 capsule 5  . diclofenac sodium (VOLTAREN) 1 % GEL Apply 2 g topically 3 (three) times daily as needed (knee pain and swelling). 100 g 5  . fenofibrate 160 MG tablet TAKE ONE TABLET BY MOUTH ONCE DAILY 30 tablet 2  . losartan (COZAAR) 50 MG tablet Take 1 tablet (50 mg total) by mouth daily. 90 tablet 3   No current facility-administered medications on file prior to visit.    Past Medical History  Diagnosis Date  . HLD (hyperlipidemia)   . HTN (hypertension)   . AAA (abdominal aortic aneurysm) [facility]) 2011    Dr Early repaired this  . Cirrhosis, biliary ([place])     liver bx 01/2008  . Fatigue   . Epistaxis   . OSA (obstructive sleep apnea)     mild on ss of 12/2008  . Leg pain     bilateral  . GERD (gastroesophageal reflux disease)   . Pneumonia   . Bleeding ulcer 1980s    H. Pylori  . Acute ischemic colitis ([place]) 03/06/2012  . Anemia   . Arthritis     bilateral hands and knees  . Blood transfusion without reported diagnosis   . Cataract     In the past  . Diastolic CHF ([place]) [id number]    Past Surgical History  Procedure Laterality Date  . Iliac artery -  femoral artery bypass graft  2011    Dr Donnetta Hutching  . Cataract extraction    . Breast biopsy    . Colonoscopy  03/08/2012    Procedure: COLONOSCOPY;  Surgeon: Gatha Mayer, MD;  Location: [place];  Service: Endoscopy;  Laterality: N/A;  . Abdominal aortic aneurysm repair  02/01/2010  . Ercp N/A 04/20/2013    Procedure: ENDOSCOPIC RETROGRADE CHOLANGIOPANCREATOGRAPHY (ERCP);  Surgeon: Ladene Artist, MD;  Location: [facility];  Service: Endoscopy;  Laterality: N/A;    Social History   Social History  .  Marital Status: Widowed    Spouse Name: N/A  . Number of Children: N/A  . Years of Education: N/A   Social History Main Topics  . Smoking status: Former Smoker -- 52 years    Types: Cigarettes    Quit date: 02/01/2010  . Smokeless tobacco: Never Used  . Alcohol Use: No  . Drug Use: No  . Sexual Activity: Not Currently   Other Topics Concern  . None   Social History Narrative    Review of Systems  Constitutional: Positive for fever (subjective), appetite change and fatigue.  HENT: Positive for congestion and sore throat. Negative for ear pain and sinus pressure.   Respiratory: Positive for cough (yellow, one episode of green phlegm). Negative for shortness of breath and wheezing.   Cardiovascular: Negative for chest pain.  Gastrointestinal: Positive for nausea (with dizziness), vomiting (with dizziness) and constipation. Negative for abdominal pain and diarrhea.  Genitourinary: Positive for urgency. Negative for dysuria, frequency and hematuria.       Incontinence which is new  Neurological: Positive for dizziness and headaches (once).       Objective:   Filed Vitals:   04/01/15 0911  BP: 124/62  Pulse: 91  Temp: 98.6 F (37 C)  Resp: 16   Filed Weights   04/01/15 0911  Weight: 209 lb (94.802 kg)   Body mass index is 31.79 kg/(m^2).   Physical Exam  Constitutional: She appears well-developed and well-nourished. No distress.  HENT:  Head: Normocephalic and atraumatic.  Right Ear: External ear normal.  Left Ear: External ear normal.  Mouth/Throat: Oropharynx is clear and moist.  Neck: Neck supple. No tracheal deviation present. No thyromegaly present.  Cardiovascular: Normal rate and regular rhythm.   Pulmonary/Chest: Effort normal and breath sounds normal. No respiratory distress. She has no wheezes. She has no rales.  Intermittent cough that is wet sounding  Abdominal: Soft. She exhibits no distension. There is no tenderness.  Lymphadenopathy:    She has no  cervical adenopathy.  Skin: Skin is warm and dry.          Assessment & Plan:   She needs prescriptions on her regular medicationsand her pharmacy  UTI Her symptoms and urine are consistent with a possible UTI We will start an antibiotic  URI It is difficult to say if her symptoms are consistent with a bacterial or viral infection, but since we are starting her on an antibiotic we will try taking one also treat possible bacterial upper respiratory infection Can continue Mucinex Increase fluids Call if no improvement or any worsening of her symptoms

## 2015-04-01 NOTE — Patient Instructions (Signed)
Continue the cough syrup.   Your prescription(s) have been submitted to your pharmacy. Please take as directed and contact our office if you believe you are having problem(s) with the medication(s).  Increase your fluid intake.  If your symptoms do not improve please call.

## 2015-04-01 NOTE — Progress Notes (Signed)
Pre visit review using our clinic review tool, if applicable. No additional management support is needed unless otherwise documented below in the visit note. 

## 2015-04-04 LAB — URINE CULTURE: Colony Count: >=100000

## 2015-04-07 ENCOUNTER — Encounter: Payer: Self-pay | Admitting: Internal Medicine

## 2015-04-07 DIAGNOSIS — I10 Essential (primary) hypertension: Secondary | ICD-10-CM

## 2015-04-07 DIAGNOSIS — IMO0002 Reserved for concepts with insufficient information to code with codable children: Secondary | ICD-10-CM

## 2015-04-07 DIAGNOSIS — M1710 Unilateral primary osteoarthritis, unspecified knee: Secondary | ICD-10-CM

## 2015-04-09 MED ORDER — LOSARTAN POTASSIUM 50 MG PO TABS: 50.0000 mg | ORAL_TABLET | Freq: Every day | ORAL | Status: DC

## 2015-04-09 MED ORDER — FENOFIBRATE 160 MG PO TABS: 160.0000 mg | ORAL_TABLET | Freq: Every day | ORAL | Status: DC

## 2015-04-09 MED ORDER — CELECOXIB 200 MG PO CAPS: 200.0000 mg | ORAL_CAPSULE | Freq: Two times a day (BID) | ORAL | Status: DC

## 2015-06-17 ENCOUNTER — Encounter: Payer: Self-pay | Admitting: Vascular Surgery

## 2015-06-22 ENCOUNTER — Other Ambulatory Visit: Payer: Self-pay | Admitting: Vascular Surgery

## 2015-06-22 ENCOUNTER — Other Ambulatory Visit: Payer: Self-pay | Admitting: *Deleted

## 2015-06-22 DIAGNOSIS — Z01812 Encounter for preprocedural laboratory examination: Secondary | ICD-10-CM

## 2015-06-22 DIAGNOSIS — I739 Peripheral vascular disease, unspecified: Secondary | ICD-10-CM

## 2015-06-23 ENCOUNTER — Ambulatory Visit
Admission: RE | Admit: 2015-06-23 | Discharge: 2015-06-23 | Disposition: A | Discharge status: Home / Self Care | Payer: Medicare Other | Source: Ambulatory Visit | Attending: Vascular Surgery | Admitting: Vascular Surgery

## 2015-06-23 ENCOUNTER — Ambulatory Visit (INDEPENDENT_AMBULATORY_CARE_PROVIDER_SITE_OTHER): Payer: Medicare Other | Admitting: Vascular Surgery

## 2015-06-23 ENCOUNTER — Encounter: Payer: Self-pay | Admitting: Vascular Surgery

## 2015-06-23 ENCOUNTER — Ambulatory Visit
Admission: RE | Admit: 2015-06-23 | Discharge: 2015-06-23 | Disposition: A | Discharge status: Home / Self Care | Payer: TRICARE For Life (TFL) | Source: Ambulatory Visit | Attending: Vascular Surgery | Admitting: Vascular Surgery

## 2015-06-23 VITALS — BP 140/91 | HR 92 | Ht 68.0 in | Wt 210.5 lb

## 2015-06-23 DIAGNOSIS — I716 Thoracoabdominal aortic aneurysm, without rupture, unspecified: Secondary | ICD-10-CM

## 2015-06-23 DIAGNOSIS — I714 Abdominal aortic aneurysm, without rupture, unspecified: Secondary | ICD-10-CM

## 2015-06-23 LAB — CREATININE, SERUM: CREATININE: 1.27 mg/dL — AB (ref 0.60–0.88)

## 2015-06-23 MED ORDER — IOPAMIDOL (ISOVUE-370) INJECTION 76%
75.0000 mL | Freq: Once | INTRAVENOUS | Status: AC | PRN
  Administered 2015-06-23: 75 mL via INTRAVENOUS

## 2015-06-23 NOTE — Progress Notes (Signed)
[facility] [place]  Patient name: Deanna [name] MRN: [id number] DOB: 11/11/1929 Sex: female  REASON FOR VISIT: Follow-up of thoracoabdominal aneurysm with CT scan  HPI: Deanna [name] is a 82 y.o. 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 She is status post open aneurysm repair by myself and 2011. She is known to have dilatation of her aorta above the level of her prior repair and is seen today for CT scan. She has no symptoms referable to this. She does report difficulty with walking with pain in both lower extremities that can be in her knees but also reports an achy sensation in both legs. No history of tissue loss.  Past Medical History  Diagnosis Date  . HLD (hyperlipidemia)   . HTN (hypertension)   . AAA (abdominal aortic aneurysm) [facility]) 2011    Dr Branston Halsted repaired this  . 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 Fatigue   . Epistaxis   . OSA (obstructive sleep apnea)     mild on ss of 12/2008  . Leg pain     bilateral  . GERD (gastroesophageal reflux disease)   . Pneumonia   . Bleeding ulcer 1980s    H. Pylori  . Acute ischemic colitis ([place]) 03/06/2012  . Anemia   . Arthritis     bilateral hands and knees  . Blood transfusion without reported diagnosis   . Cataract     In the past  . Diastolic CHF ([place]) [id number]    Family History  Problem Relation Age of Onset  . Cancer Brother     prostate    SOCIAL HISTORY: Social History  Substance Use Topics  . Smoking status: Former Smoker -- 52 years    Types: Cigarettes    Quit date: 02/01/2010  . Smokeless tobacco: Never Used  . Alcohol Use: No    Allergies  Allergen Reactions  . Crestor [Rosuvastatin]   . Ezetimibe Other (See Comments)    Current Outpatient Prescriptions  Medication Sig Dispense Refill  . diclofenac sodium (VOLTAREN) 1 % GEL Apply 2 g topically 3 (three) times daily as needed (knee pain and swelling). 100 g 5  . fenofibrate 160 MG  tablet Take 1 tablet (160 mg total) by mouth daily. 90 tablet 1  . losartan (COZAAR) 50 MG tablet Take 1 tablet (50 mg total) by mouth daily. 90 tablet 1  . celecoxib (CELEBREX) 200 MG capsule Take 1 capsule (200 mg total) by mouth 2 (two) times daily. (Patient not taking: Reported on 06/23/2015) 180 capsule 1  . cephALEXin (KEFLEX) 500 MG capsule Take 1 capsule (500 mg total) by mouth 2 (two) times daily. (Patient not taking: Reported on 06/23/2015) 20 capsule 0   No current facility-administered medications for this visit.    REVIEW OF SYSTEMS:  [X]  denotes positive finding, [ ]  denotes negative finding Cardiac  Comments:  Chest pain or chest pressure:    Shortness of breath upon exertion:    Short of breath when lying flat:    Irregular heart rhythm:        Vascular    Pain in calf, thigh, or hip brought on by ambulation: x   Pain in feet at night that wakes you up from your sleep:     Blood clot in your veins:    Leg swelling:         Pulmonary    Oxygen at home:    Productive cough:  Wheezing:         Neurologic    Sudden weakness in arms or legs:     Sudden numbness in arms or legs:     Sudden onset of difficulty speaking or slurred speech:    Temporary loss of vision in one eye:     Problems with dizziness:         Gastrointestinal    Blood in stool:     Vomited blood:         Genitourinary    Burning when urinating:     Blood in urine:        Psychiatric    Major depression:         Hematologic    Bleeding problems:    Problems with blood clotting too easily:        Skin    Rashes or ulcers:        Constitutional    Fever or chills:      PHYSICAL EXAM: Filed Vitals:   06/23/15 1219 06/23/15 1221  BP: 162/94 140/91  Pulse: 92   Height: 5\' 8"  (1.727 m)   Weight: 210 lb 8 oz (95.482 kg)   SpO2: 93%     GENERAL: The patient is a well-nourished female, in no acute distress. The vital signs are documented above. CARDIAC: There is a regular rate  and rhythm.  VASCULAR: Palpable radial and palpable femoral pulses bilaterally. She does have 1-2+ posterior tibial pulses bilaterally PULMONARY: There is good air exchange bilaterally without wheezing or rales. ABDOMEN: Soft and non-tender with no masses noted MUSCULOSKELETAL: There are no major deformities or cyanosis. NEUROLOGIC: No focal weakness or paresthesias are detected. SKIN: There are no ulcers or rashes noted. PSYCHIATRIC: The patient has a normal affect.  DATA:  CT scan of her chest and abdomen today were reviewed. [facility] had shut down of the CT scanner and so I discussed the findings with the patient's daughter after they had left our office. Discussed options for different scenarios with the patient and her daughter while they were here. CT scan reviewed shows dilatation of her ascending aorta 5.5 cm. 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 Her role aortobiiliac bypass looks quite good with no evidence of stenosis or false aneurysm.  MEDICAL ISSUES: Table overall. Continued dilatation of her thoracoabdominal aneurysm. Would not recommend any attempt to treat this since I feel that her risk for surgery far outweigh this. Also I explained that the ascending aorta 5.5 also be quite difficult with her age and comorbidity. Would recommend repeat CT scan in one year for continued follow-up. Discussed symptoms of leaking aneurysm and they will report immediately should that occur No Follow-up on file.   Curt Jews [facility] [place]: [phone]

## 2015-06-23 NOTE — Addendum Note (Signed)
Addended by: Mena Goes on: 06/23/2015 05:01 PM   Modules accepted: Orders

## 2015-06-25 ENCOUNTER — Ambulatory Visit (INDEPENDENT_AMBULATORY_CARE_PROVIDER_SITE_OTHER): Payer: Medicare Other | Admitting: Family Medicine

## 2015-06-25 ENCOUNTER — Encounter: Payer: Self-pay | Admitting: Family Medicine

## 2015-06-25 VITALS — BP 174/100 | HR 84 | Temp 98.1°F | Ht 68.0 in | Wt 211.8 lb

## 2015-06-25 DIAGNOSIS — Z23 Encounter for immunization: Secondary | ICD-10-CM

## 2015-06-25 DIAGNOSIS — M170 Bilateral primary osteoarthritis of knee: Secondary | ICD-10-CM | POA: Diagnosis not present

## 2015-06-25 DIAGNOSIS — I10 Essential (primary) hypertension: Secondary | ICD-10-CM

## 2015-06-25 DIAGNOSIS — L570 Actinic keratosis: Secondary | ICD-10-CM | POA: Diagnosis not present

## 2015-06-25 MED ORDER — TRAMADOL HCL 50 MG PO TABS: 50.0000 mg | ORAL_TABLET | Freq: Four times a day (QID) | ORAL | Status: DC | PRN

## 2015-06-25 MED ORDER — LOSARTAN POTASSIUM 100 MG PO TABS
100.0000 mg | ORAL_TABLET | Freq: Every day | ORAL | Status: DC
Start: 2015-06-25 — End: 2016-01-02

## 2015-06-25 NOTE — Progress Notes (Signed)
Pre visit review using our clinic review tool, if applicable. No additional management support is needed unless otherwise documented below in the visit note. 

## 2015-06-25 NOTE — Patient Instructions (Signed)
Hypertension Hypertension, commonly called high blood pressure, is when the force of blood pumping through your arteries is too strong. Your arteries are the blood vessels that carry blood from your heart throughout your body. A blood pressure reading consists of a higher number over a lower number, such as 110/72. The higher number (systolic) is the pressure inside your arteries when your heart pumps. The lower number (diastolic) is the pressure inside your arteries when your heart relaxes. Ideally you want your blood pressure below 120/80. Hypertension forces your heart to work harder to pump blood. Your arteries may become narrow or stiff. Having untreated or uncontrolled hypertension can cause heart attack, stroke, kidney disease, and other problems. RISK FACTORS Some risk factors for high blood pressure are controllable. Others are not.  Risk factors you cannot control include:   Race. You may be at higher risk if you are African American.  Age. Risk increases with age.  Gender. Men are at higher risk than women before age 45 years. After age 65, women are at higher risk than men. Risk factors you can control include:  Not getting enough exercise or physical activity.  Being overweight.  Getting too much fat, sugar, calories, or salt in your diet.  Drinking too much alcohol. SIGNS AND SYMPTOMS Hypertension does not usually cause signs or symptoms. Extremely high blood pressure (hypertensive crisis) may cause headache, anxiety, shortness of breath, and nosebleed. DIAGNOSIS To check if you have hypertension, your health care provider will measure your blood pressure while you are seated, with your arm held at the level of your heart. It should be measured at least twice using the same arm. Certain conditions can cause a difference in blood pressure between your right and left arms. A blood pressure reading that is higher than normal on one occasion does not mean that you need treatment. If  it is not clear whether you have high blood pressure, you may be asked to return on a different day to have your blood pressure checked again. Or, you may be asked to monitor your blood pressure at home for 1 or more weeks. TREATMENT Treating high blood pressure includes making lifestyle changes and possibly taking medicine. Living a healthy lifestyle can help lower high blood pressure. You may need to change some of your habits. Lifestyle changes may include:  Following the DASH diet. This diet is high in fruits, vegetables, and whole grains. It is low in salt, red meat, and added sugars.  Keep your sodium intake below 2,300 mg per day.  Getting at least 30-45 minutes of aerobic exercise at least 4 times per week.  Losing weight if necessary.  Not smoking.  Limiting alcoholic beverages.  Learning ways to reduce stress. Your health care provider may prescribe medicine if lifestyle changes are not enough to get your blood pressure under control, and if one of the following is true:  You are 18-59 years of age and your systolic blood pressure is above 140.  You are 60 years of age or older, and your systolic blood pressure is above 150.  Your diastolic blood pressure is above 90.  You have diabetes, and your systolic blood pressure is over 140 or your diastolic blood pressure is over 90.  You have kidney disease and your blood pressure is above 140/90.  You have heart disease and your blood pressure is above 140/90. Your personal target blood pressure may vary depending on your medical conditions, your age, and other factors. HOME CARE INSTRUCTIONS    Have your blood pressure rechecked as directed by your health care provider.   Take medicines only as directed by your health care provider. Follow the directions carefully. Blood pressure medicines must be taken as prescribed. The medicine does not work as well when you skip doses. Skipping doses also puts you at risk for  problems.  Do not smoke.   Monitor your blood pressure at home as directed by your health care provider. SEEK MEDICAL CARE IF:   You think you are having a reaction to medicines taken.  You have recurrent headaches or feel dizzy.  You have swelling in your ankles.  You have trouble with your vision. SEEK IMMEDIATE MEDICAL CARE IF:  You develop a severe headache or confusion.  You have unusual weakness, numbness, or feel faint.  You have severe chest or abdominal pain.  You vomit repeatedly.  You have trouble breathing. MAKE SURE YOU:   Understand these instructions.  Will watch your condition.  Will get help right away if you are not doing well or get worse.   This information is not intended to replace advice given to you by your health care provider. Make sure you discuss any questions you have with your health care provider.   Document Released: 03/21/2005 Document Revised: 08/05/2014 Document Reviewed: 01/11/2013 Elsevier Interactive Patient Education 2016 [employer]  

## 2015-06-25 NOTE — Progress Notes (Signed)
Patient ID: Deanna [name], female   DOB: 04/01/1930, 82 y.o.   MRN: [id number]     Patient ID: Deanna [name], female    DOB: 09-May-1929  Age: 82 y.o. MRN: [id number]    Subjective:  Subjective HPI [name] presents for f/u bp -- -no complaints  Review of Systems  Constitutional: Negative for diaphoresis, appetite change, fatigue and unexpected weight change.  Eyes: Negative for pain, redness and visual disturbance.  Respiratory: Negative for cough, chest tightness, shortness of breath and wheezing.   Cardiovascular: Negative for chest pain, palpitations and leg swelling.  Endocrine: Negative for cold intolerance, heat intolerance, polydipsia, polyphagia and polyuria.  Genitourinary: Negative for dysuria, frequency and difficulty urinating.  Neurological: Negative for dizziness, light-headedness, numbness and headaches.    History Past Medical History  Diagnosis Date  . HLD (hyperlipidemia)   . HTN (hypertension)   . AAA (abdominal aortic aneurysm) [facility]) 2011    Dr Early repaired this  . Cirrhosis, biliary ([place])     liver bx 01/2008  . Fatigue   . Epistaxis   . OSA (obstructive sleep apnea)     mild on ss of 12/2008  . Leg pain     bilateral  . GERD (gastroesophageal reflux disease)   . Pneumonia   . Bleeding ulcer 1980s    H. Pylori  . Acute ischemic colitis ([place]) 03/06/2012  . Anemia   . Arthritis     bilateral hands and knees  . Blood transfusion without reported diagnosis   . Cataract     In the past  . Diastolic CHF ([place]) [id number]    She has past surgical history that includes Iliac artery - femoral artery bypass graft (2011); Cataract extraction; Breast biopsy; Colonoscopy (03/08/2012); Abdominal aortic aneurysm repair (02/01/2010); and ERCP (N/A, 04/20/2013).   Her family history includes Cancer in her brother.She reports that she quit smoking about 5 years ago. Her smoking use included Cigarettes. She quit after 52 years of use. She has never used smokeless  tobacco. She reports that she does not drink alcohol or use illicit drugs.  Current Outpatient Prescriptions on File Prior to Visit  Medication Sig Dispense Refill  . diclofenac sodium (VOLTAREN) 1 % GEL Apply 2 g topically 3 (three) times daily as needed (knee pain and swelling). 100 g 5  . fenofibrate 160 MG tablet Take 1 tablet (160 mg total) by mouth daily. 90 tablet 1   No current facility-administered medications on file prior to visit.     Objective:  Objective Physical Exam  Constitutional: She is oriented to person, place, and time. She appears well-developed and well-nourished.  HENT:  Head: Normocephalic and atraumatic.  Eyes: Conjunctivae and EOM are normal.  Neck: Normal range of motion. Neck supple. No JVD present. Carotid bruit is not present. No thyromegaly present.  Cardiovascular: Normal rate, regular rhythm and normal heart sounds.   No murmur heard. Pulmonary/Chest: Effort normal and breath sounds normal. No respiratory distress. She has no wheezes. She has no rales. She exhibits no tenderness.  Musculoskeletal: She exhibits no edema.  Neurological: She is alert and oriented to person, place, and time.  Psychiatric: She has a normal mood and affect.  Nursing note and vitals reviewed.  BP 174/100 mmHg  Pulse 84  Temp(Src) 98.1 F (36.7 C) (Oral)  Ht 5\' 8"  (1.727 m)  Wt 211 lb 12.8 oz (96.072 kg)  BMI 32.21 kg/m2  SpO2 94% Wt Readings from Last 3 Encounters:  06/25/15 211 lb 12.8 oz (  96.072 kg)  06/23/15 210 lb 8 oz (95.482 kg)  04/01/15 209 lb (94.802 kg)     Lab Results  Component Value Date   WBC 9.1 06/11/2014   HGB 14.6 06/11/2014   HCT 43.6 06/11/2014   PLT 288.0 06/11/2014   GLUCOSE 97 11/27/2014   CHOL 163 11/27/2014   TRIG 201.0* 11/27/2014   HDL 28.80* 11/27/2014   LDLDIRECT 94.0 11/27/2014   LDLCALC 62 07/16/2012   ALT 12 11/27/2014   AST 21 11/27/2014   NA 136 11/27/2014   K 4.2 11/27/2014   CL 101 11/27/2014   CREATININE 1.27*  06/22/2015   BUN 24* 11/27/2014   CO2 27 11/27/2014   TSH 4.70* 02/24/2014   INR 1.15 04/20/2013   HGBA1C 5.5 06/24/2011    Ct Angio Chest W/cm &/or Wo Cm  06/23/2015  CLINICAL DATA:  Abdominal aortic aneurysm. EXAM: CT ANGIOGRAPHY CHEST, ABDOMEN AND PELVIS TECHNIQUE: Multidetector CT imaging through the chest, abdomen and pelvis was performed using the standard protocol during bolus administration of intravenous contrast. Multiplanar reconstructed images and MIPs were obtained and reviewed to evaluate the vascular anatomy. CONTRAST:  75 cc Isovue 370 COMPARISON:  06/11/2014 FINDINGS: CTA CHEST FINDINGS Aneurysmal dilatation of the thoracic aorta is present. Maximal diameter at the sinus of Valsalva, sino-tubular junction, and ascending aorta are 4.8 cm, 4.1 cm, and 5.5 cm. Aortic diameter in the distal arch is 3.9 cm. In the proximal descending aorta, it measures 4.3 cm. There is irregular plaque along the distal arch and descending thoracic aorta. There is also a penetrating ulcer with a saccular aneurysm extending inferiorly from the distal arch. The neck of the penetrating ulcer is 2.7 cm. The small aneurysm extends 11 mm beyond the expected lumen of the vessel. Tiny chronic appearing dissection flaps are seen at the proximal and distal extent of the penetrating ulcer. Atherosclerotic changes of the great vessels are noted without significant focal narrowing. Vertebral arteries are also patent within the confines of the examination. No central pulmonary thromboemboli are identified. Three vessel coronary artery calcification. No abnormal mediastinal adenopathy. No axillary adenopathy. Thyroid gland is unremarkable. Prominent asymmetric density in the right breast measures 3.3 x 2.1 cm Dependent atelectasis bilaterally. Mild centrilobular emphysema is present. There is a linear area with central nodularity on image 28 at the left apex. This extends towards the central left upper lobe with cavitary areas  and calcification. Other calcified granulomata in the left upper lobe are noted. No acute bony deformity. Review of the MIP images confirms the above findings. CTA ABDOMEN AND PELVIS FINDINGS Aneurysmal dilatation of the abdominal aorta is present. At the diaphragmatic hiatus, maximal diameter is 4.3 cm. My direct measurements of the prior study result in in a previous measurement of 3.7 cm. Aneurysmal dilatation of the juxtarenal aorta measures 4.6 cm in transverse diameter and 4.2 cm in AP diameter. Previous maximal diameter was 4.0 cm. There is chronic smooth mural thrombus within the aneurysmal segment. Narrowing at the origin of the celiac axis is present. 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 Branch vessels are patent. SMA is patent. Calcified plaque at the origin of the right renal artery is noted. Significant narrowing cannot be excluded. Mild atherosclerotic changes at the origin of the left renal artery. IMA origin has been sacrificed. Branch vessels reconstitute and are diminutive. Graft anastomosis to the distal right common iliac artery is patent. Mild narrowing at the origin of the right external iliac artery. Atherosclerotic calcifications are  present without significant focal narrowing. Diffuse atherosclerotic changes of the right internal iliac artery, which is mildly ectatic. Left graft to common iliac artery anastomosis is patent. Diffuse atherosclerotic calcifications of the left iliac arterial vasculature without significant focal narrowing. Left internal iliac artery is mildly ectatic. Gallstones are suspected. Pneumobilia is present suggesting previous biliary instrumentation. This is stable. No liver lesion. Spleen, pancreas, adrenal glands are within normal limits Several benign appearing cysts are present in the kidneys bilaterally. There are calculi within the right kidney. The largest is in the lower pole measuring 6 mm. Ventral abdominal hernia is not  significantly changed with a wide neck. Transverse colon extends into the hernia sac. Bladder, uterus, and adnexa are within normal limits for age No free-fluid.  No abnormal retroperitoneal adenopathy. L4 compression deformity is stable. Review of the MIP images confirms the above findings. IMPRESSION: Aneurysmal dilatation of the thoracic aorta measures 5.5 cm in the ascending aorta. Recommend semi-annual imaging followup by CTA or MRA and referral to cardiothoracic surgery if not already obtained. This recommendation follows 2010 ACCF/AHA/AATS/ACR/ASA/SCA/SCAI/SIR/STS/SVM Guidelines for the Diagnosis and Management of Patients With Thoracic Aortic Disease. Circulation. 2010; 121: e266-e369TAA There is a wide neck penetrating atherosclerotic ulcer in the distal aortic arch extending inferiorly that is relatively prominent as described. Asymmetric density in the right breast as described. Correlation with mammography is recommended Pulmonary parenchymal changes at the left apex are most consistent with chronic granulomatous disease. Underlying malignancy is not entirely excluded without prior imaging for comparison. Initial three-month follow-up and subsequent annual follow-up is recommended. Juxtarenal aneurysmal dilatation of the abdominal aorta has increased from 4.0 cm to 4.6 cm. Recommend followup by abdomen and pelvis CTA in 6 months, and vascular surgery referral/consultation if not already obtained. This recommendation follows ACR consensus guidelines: White Paper of the ACR Incidental Findings Committee II on Vascular Findings. J Am Coll Radiol 2013; 10:789-794. Significant narrowing at the origin of the right renal artery cannot be excluded. Renal artery Doppler study may be helpful. Aorto bi-iliac graft repair is patent. Possible gallstones.  Ultrasound is recommended. Right nephrolithiasis. Ventral hernia. Electronically Signed   By: Marybelle Killings M.D.   On: 06/23/2015 13:06   Ct Angio Abd/pel W/  And/or W/o  06/23/2015  CLINICAL DATA:  Abdominal aortic aneurysm. EXAM: CT ANGIOGRAPHY CHEST, ABDOMEN AND PELVIS TECHNIQUE: Multidetector CT imaging through the chest, abdomen and pelvis was performed using the standard protocol during bolus administration of intravenous contrast. Multiplanar reconstructed images and MIPs were obtained and reviewed to evaluate the vascular anatomy. CONTRAST:  75 cc Isovue 370 COMPARISON:  06/11/2014 FINDINGS: CTA CHEST FINDINGS Aneurysmal dilatation of the thoracic aorta is present. Maximal diameter at the sinus of Valsalva, sino-tubular junction, and ascending aorta are 4.8 cm, 4.1 cm, and 5.5 cm. Aortic diameter in the distal arch is 3.9 cm. In the proximal descending aorta, it measures 4.3 cm. There is irregular plaque along the distal arch and descending thoracic aorta. There is also a penetrating ulcer with a saccular aneurysm extending inferiorly from the distal arch. The neck of the penetrating ulcer is 2.7 cm. The small aneurysm extends 11 mm beyond the expected lumen of the vessel. Tiny chronic appearing dissection flaps are seen at the proximal and distal extent of the penetrating ulcer. Atherosclerotic changes of the great vessels are noted without significant focal narrowing. Vertebral arteries are also patent within the confines of the examination. No central pulmonary thromboemboli are identified. Three vessel coronary artery calcification. No abnormal mediastinal adenopathy.  No axillary adenopathy. Thyroid gland is unremarkable. Prominent asymmetric density in the right breast measures 3.3 x 2.1 cm Dependent atelectasis bilaterally. Mild centrilobular emphysema is present. There is a linear area with central nodularity on image 28 at the left apex. This extends towards the central left upper lobe with cavitary areas and calcification. Other calcified granulomata in the left upper lobe are noted. No acute bony deformity. Review of the MIP images confirms the above  findings. CTA ABDOMEN AND PELVIS FINDINGS Aneurysmal dilatation of the abdominal aorta is present. At the diaphragmatic hiatus, maximal diameter is 4.3 cm. My direct measurements of the prior study result in in a previous measurement of 3.7 cm. Aneurysmal dilatation of the juxtarenal aorta measures 4.6 cm in transverse diameter and 4.2 cm in AP diameter. Previous maximal diameter was 4.0 cm. There is chronic smooth mural thrombus within the aneurysmal segment. Narrowing at the origin of the celiac axis is present. 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 Branch vessels are patent. SMA is patent. Calcified plaque at the origin of the right renal artery is noted. Significant narrowing cannot be excluded. Mild atherosclerotic changes at the origin of the left renal artery. IMA origin has been sacrificed. Branch vessels reconstitute and are diminutive. Graft anastomosis to the distal right common iliac artery is patent. Mild narrowing at the origin of the right external iliac artery. Atherosclerotic calcifications are present without significant focal narrowing. Diffuse atherosclerotic changes of the right internal iliac artery, which is mildly ectatic. Left graft to common iliac artery anastomosis is patent. Diffuse atherosclerotic calcifications of the left iliac arterial vasculature without significant focal narrowing. Left internal iliac artery is mildly ectatic. Gallstones are suspected. Pneumobilia is present suggesting previous biliary instrumentation. This is stable. No liver lesion. Spleen, pancreas, adrenal glands are within normal limits Several benign appearing cysts are present in the kidneys bilaterally. There are calculi within the right kidney. The largest is in the lower pole measuring 6 mm. Ventral abdominal hernia is not significantly changed with a wide neck. Transverse colon extends into the hernia sac. Bladder, uterus, and adnexa are within normal limits for age No  free-fluid.  No abnormal retroperitoneal adenopathy. L4 compression deformity is stable. Review of the MIP images confirms the above findings. IMPRESSION: Aneurysmal dilatation of the thoracic aorta measures 5.5 cm in the ascending aorta. Recommend semi-annual imaging followup by CTA or MRA and referral to cardiothoracic surgery if not already obtained. This recommendation follows 2010 ACCF/AHA/AATS/ACR/ASA/SCA/SCAI/SIR/STS/SVM Guidelines for the Diagnosis and Management of Patients With Thoracic Aortic Disease. Circulation. 2010; 121: [id number] There is a wide neck penetrating atherosclerotic ulcer in the distal aortic arch extending inferiorly that is relatively prominent as described. Asymmetric density in the right breast as described. Correlation with mammography is recommended Pulmonary parenchymal changes at the left apex are most consistent with chronic granulomatous disease. Underlying malignancy is not entirely excluded without prior imaging for comparison. Initial three-month follow-up and subsequent annual follow-up is recommended. Juxtarenal aneurysmal dilatation of the abdominal aorta has increased from 4.0 cm to 4.6 cm. Recommend followup by abdomen and pelvis CTA in 6 months, and vascular surgery referral/consultation if not already obtained. This recommendation follows ACR consensus guidelines: White Paper of the ACR Incidental Findings Committee II on Vascular Findings. J Am Coll Radiol 2013; 10:789-794. Significant narrowing at the origin of the right renal artery cannot be excluded. Renal artery Doppler study may be helpful. Aorto bi-iliac graft repair is patent. Possible gallstones.  Ultrasound is recommended. Right  nephrolithiasis. Ventral hernia. Electronically Signed   By: Marybelle Killings M.D.   On: 06/23/2015 13:06     Assessment & Plan:  Plan I have discontinued Ms. Hudlow's cephALEXin, losartan, and celecoxib. I am also having her start on losartan and traMADol. Additionally, I am  having her maintain her diclofenac sodium and fenofibrate.  Meds ordered this encounter  Medications  . losartan (COZAAR) 100 MG tablet    Sig: Take 1 tablet (100 mg total) by mouth daily.    Dispense:  90 tablet    Refill:  3  . traMADol (ULTRAM) 50 MG tablet    Sig: Take 1 tablet (50 mg total) by mouth every 6 (six) hours as needed.    Dispense:  30 tablet    Refill:  1    Problem List Items Addressed This Visit      Unprioritized   Essential hypertension    Uncontrolled Inc cozaar to 100 mg  rto 2-3 weeks       Relevant Medications   losartan (COZAAR) 100 MG tablet    Other Visit Diagnoses    Need for pneumococcal vaccination    -  Primary    Relevant Orders    Pneumococcal conjugate vaccine 13-valent (Completed)    Keratosis        Relevant Orders    Ambulatory referral to Dermatology    Osteoarthritis of both knees, unspecified osteoarthritis type        Relevant Medications    traMADol (ULTRAM) 50 MG tablet       Follow-up: Return in about 3 weeks (around 07/16/2015), or if symptoms worsen or fail to improve.  Garnet Koyanagi, DO

## 2015-06-28 NOTE — Assessment & Plan Note (Signed)
Uncontrolled Inc cozaar to 100 mg  rto 2-3 weeks

## 2015-07-09 ENCOUNTER — Encounter: Payer: Self-pay | Admitting: Family Medicine

## 2015-07-09 ENCOUNTER — Ambulatory Visit (INDEPENDENT_AMBULATORY_CARE_PROVIDER_SITE_OTHER): Payer: Medicare Other | Admitting: Family Medicine

## 2015-07-09 VITALS — BP 134/90 | HR 94 | Temp 97.7°F | Ht 68.0 in | Wt 211.6 lb

## 2015-07-09 DIAGNOSIS — W19XXXA Unspecified fall, initial encounter: Secondary | ICD-10-CM | POA: Diagnosis not present

## 2015-07-09 DIAGNOSIS — R29898 Other symptoms and signs involving the musculoskeletal system: Secondary | ICD-10-CM | POA: Diagnosis not present

## 2015-07-09 DIAGNOSIS — I10 Essential (primary) hypertension: Secondary | ICD-10-CM | POA: Diagnosis not present

## 2015-07-09 MED ORDER — NONFORMULARY OR COMPOUNDED ITEM: Status: DC

## 2015-07-09 NOTE — Progress Notes (Signed)
Patient ID: Deanna [name], female    DOB: Mar 11, 1930  Age: 82 y.o. MRN: [id number]    Subjective:  Subjective HPI Deanna [name] presents for bp f/u.  No complaints.    Her daughter is with her.  She has been falling more.  They are requesting a wheelchair to make it easier to get around with her.   Review of Systems  Constitutional: Negative for diaphoresis, appetite change, fatigue and unexpected weight change.  Eyes: Negative for pain, redness and visual disturbance.  Respiratory: Negative for cough, chest tightness, shortness of breath and wheezing.   Cardiovascular: Negative for chest pain, palpitations and leg swelling.  Endocrine: Negative for cold intolerance, heat intolerance, polydipsia, polyphagia and polyuria.  Genitourinary: Negative for dysuria, frequency and difficulty urinating.  Neurological: Negative for dizziness, light-headedness, numbness and headaches.    History Past Medical History  Diagnosis Date  . HLD (hyperlipidemia)   . HTN (hypertension)   . AAA (abdominal aortic aneurysm) [facility]) 2011    Dr Early repaired this  . 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 Fatigue   . Epistaxis   . OSA (obstructive sleep apnea)     mild on ss of 12/2008  . Leg pain     bilateral  . GERD (gastroesophageal reflux disease)   . Pneumonia   . Bleeding ulcer 1980s    H. Pylori  . Acute ischemic colitis ([place]) 03/06/2012  . Anemia   . Arthritis     bilateral hands and knees  . Blood transfusion without reported diagnosis   . Cataract     In the past  . Diastolic CHF ([place]) [id number]    She has past surgical history that includes Iliac artery - femoral artery bypass graft (2011); Cataract extraction; Breast biopsy; Colonoscopy (03/08/2012); Abdominal aortic aneurysm repair (02/01/2010); and ERCP (N/A, 04/20/2013).   Her family history includes Cancer in her brother.She reports that she quit smoking about 5 years ago. Her smoking use included Cigarettes. She quit after  52 years of use. She has never used smokeless tobacco. She reports that she does not drink alcohol or use illicit drugs.  Current Outpatient Prescriptions on File Prior to Visit  Medication Sig Dispense Refill  . diclofenac sodium (VOLTAREN) 1 % GEL Apply 2 g topically 3 (three) times daily as needed (knee pain and swelling). 100 g 5  . fenofibrate 160 MG tablet Take 1 tablet (160 mg total) by mouth daily. 90 tablet 1  . losartan (COZAAR) 100 MG tablet Take 1 tablet (100 mg total) by mouth daily. 90 tablet 3  . traMADol (ULTRAM) 50 MG tablet Take 1 tablet (50 mg total) by mouth every 6 (six) hours as needed. 30 tablet 1   No current facility-administered medications on file prior to visit.     Objective:  Objective Physical Exam  Constitutional: She is oriented to person, place, and time. She appears well-developed and well-nourished.  HENT:  Head: Normocephalic and atraumatic.  Eyes: Conjunctivae and EOM are normal.  Neck: Normal range of motion. Neck supple. No JVD present. Carotid bruit is not present. No thyromegaly present.  Cardiovascular: Normal rate, regular rhythm and normal heart sounds.   No murmur heard. Pulmonary/Chest: Effort normal and breath sounds normal. No respiratory distress. She has no wheezes. She has no rales. She exhibits no tenderness.  Musculoskeletal: She exhibits no edema.  Neurological: She is alert and oriented to person, place, and time.  Psychiatric: She has a normal mood and  affect.  Nursing note and vitals reviewed.  BP 134/90 mmHg  Pulse 94  Temp(Src) 97.7 F (36.5 C) (Oral)  Ht 5\' 8"  (1.727 m)  Wt 211 lb 9.6 oz (95.981 kg)  BMI 32.18 kg/m2  SpO2 96% Wt Readings from Last 3 Encounters:  07/09/15 211 lb 9.6 oz (95.981 kg)  06/25/15 211 lb 12.8 oz (96.072 kg)  06/23/15 210 lb 8 oz (95.482 kg)     Lab Results  Component Value Date   WBC 9.1 06/11/2014   HGB 14.6 06/11/2014   HCT 43.6 06/11/2014   PLT 288.0 06/11/2014   GLUCOSE 97  11/27/2014   CHOL 163 11/27/2014   TRIG 201.0* 11/27/2014   HDL 28.80* 11/27/2014   LDLDIRECT 94.0 11/27/2014   LDLCALC 62 07/16/2012   ALT 12 11/27/2014   AST 21 11/27/2014   NA 136 11/27/2014   K 4.2 11/27/2014   CL 101 11/27/2014   CREATININE 1.27* 06/22/2015   BUN 24* 11/27/2014   CO2 27 11/27/2014   TSH 4.70* 02/24/2014   INR 1.15 04/20/2013   HGBA1C 5.5 06/24/2011    Ct Angio Chest W/cm &/or Wo Cm  06/23/2015  CLINICAL DATA:  Abdominal aortic aneurysm. EXAM: CT ANGIOGRAPHY CHEST, ABDOMEN AND PELVIS TECHNIQUE: Multidetector CT imaging through the chest, abdomen and pelvis was performed using the standard protocol during bolus administration of intravenous contrast. Multiplanar reconstructed images and MIPs were obtained and reviewed to evaluate the vascular anatomy. CONTRAST:  75 cc Isovue 370 COMPARISON:  06/11/2014 FINDINGS: CTA CHEST FINDINGS Aneurysmal dilatation of the thoracic aorta is present. Maximal diameter at the sinus of Valsalva, sino-tubular junction, and ascending aorta are 4.8 cm, 4.1 cm, and 5.5 cm. Aortic diameter in the distal arch is 3.9 cm. In the proximal descending aorta, it measures 4.3 cm. There is irregular plaque along the distal arch and descending thoracic aorta. There is also a penetrating ulcer with a saccular aneurysm extending inferiorly from the distal arch. The neck of the penetrating ulcer is 2.7 cm. The small aneurysm extends 11 mm beyond the expected lumen of the vessel. Tiny chronic appearing dissection flaps are seen at the proximal and distal extent of the penetrating ulcer. Atherosclerotic changes of the great vessels are noted without significant focal narrowing. Vertebral arteries are also patent within the confines of the examination. No central pulmonary thromboemboli are identified. Three vessel coronary artery calcification. No abnormal mediastinal adenopathy. No axillary adenopathy. Thyroid gland is unremarkable. Prominent asymmetric density  in the right breast measures 3.3 x 2.1 cm Dependent atelectasis bilaterally. Mild centrilobular emphysema is present. There is a linear area with central nodularity on image 28 at the left apex. This extends towards the central left upper lobe with cavitary areas and calcification. Other calcified granulomata in the left upper lobe are noted. No acute bony deformity. Review of the MIP images confirms the above findings. CTA ABDOMEN AND PELVIS FINDINGS Aneurysmal dilatation of the abdominal aorta is present. At the diaphragmatic hiatus, maximal diameter is 4.3 cm. My direct measurements of the prior study result in in a previous measurement of 3.7 cm. Aneurysmal dilatation of the juxtarenal aorta measures 4.6 cm in transverse diameter and 4.2 cm in AP diameter. Previous maximal diameter was 4.0 cm. There is chronic smooth mural thrombus within the aneurysmal segment. Narrowing at the origin of the celiac axis is present. 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 Branch vessels are patent. SMA is patent. Calcified plaque at the origin of the right  renal artery is noted. Significant narrowing cannot be excluded. Mild atherosclerotic changes at the origin of the left renal artery. IMA origin has been sacrificed. Branch vessels reconstitute and are diminutive. Graft anastomosis to the distal right common iliac artery is patent. Mild narrowing at the origin of the right external iliac artery. Atherosclerotic calcifications are present without significant focal narrowing. Diffuse atherosclerotic changes of the right internal iliac artery, which is mildly ectatic. Left graft to common iliac artery anastomosis is patent. Diffuse atherosclerotic calcifications of the left iliac arterial vasculature without significant focal narrowing. Left internal iliac artery is mildly ectatic. Gallstones are suspected. Pneumobilia is present suggesting previous biliary instrumentation. This is stable. No  liver lesion. Spleen, pancreas, adrenal glands are within normal limits Several benign appearing cysts are present in the kidneys bilaterally. There are calculi within the right kidney. The largest is in the lower pole measuring 6 mm. Ventral abdominal hernia is not significantly changed with a wide neck. Transverse colon extends into the hernia sac. Bladder, uterus, and adnexa are within normal limits for age No free-fluid.  No abnormal retroperitoneal adenopathy. L4 compression deformity is stable. Review of the MIP images confirms the above findings. IMPRESSION: Aneurysmal dilatation of the thoracic aorta measures 5.5 cm in the ascending aorta. Recommend semi-annual imaging followup by CTA or MRA and referral to cardiothoracic surgery if not already obtained. This recommendation follows 2010 ACCF/AHA/AATS/ACR/ASA/SCA/SCAI/SIR/STS/SVM Guidelines for the Diagnosis and Management of Patients With Thoracic Aortic Disease. Circulation. 2010; 121: [id number] There is a wide neck penetrating atherosclerotic ulcer in the distal aortic arch extending inferiorly that is relatively prominent as described. Asymmetric density in the right breast as described. Correlation with mammography is recommended Pulmonary parenchymal changes at the left apex are most consistent with chronic granulomatous disease. Underlying malignancy is not entirely excluded without prior imaging for comparison. Initial three-month follow-up and subsequent annual follow-up is recommended. Juxtarenal aneurysmal dilatation of the abdominal aorta has increased from 4.0 cm to 4.6 cm. Recommend followup by abdomen and pelvis CTA in 6 months, and vascular surgery referral/consultation if not already obtained. This recommendation follows ACR consensus guidelines: White Paper of the ACR Incidental Findings Committee II on Vascular Findings. J Am Coll Radiol 2013; 10:789-794. Significant narrowing at the origin of the right renal artery cannot be excluded.  Renal artery Doppler study may be helpful. Aorto bi-iliac graft repair is patent. Possible gallstones.  Ultrasound is recommended. Right nephrolithiasis. Ventral hernia. Electronically Signed   By: Marybelle Killings M.D.   On: 06/23/2015 13:06   Ct Angio Abd/pel W/ And/or W/o  06/23/2015  CLINICAL DATA:  Abdominal aortic aneurysm. EXAM: CT ANGIOGRAPHY CHEST, ABDOMEN AND PELVIS TECHNIQUE: Multidetector CT imaging through the chest, abdomen and pelvis was performed using the standard protocol during bolus administration of intravenous contrast. Multiplanar reconstructed images and MIPs were obtained and reviewed to evaluate the vascular anatomy. CONTRAST:  75 cc Isovue 370 COMPARISON:  06/11/2014 FINDINGS: CTA CHEST FINDINGS Aneurysmal dilatation of the thoracic aorta is present. Maximal diameter at the sinus of Valsalva, sino-tubular junction, and ascending aorta are 4.8 cm, 4.1 cm, and 5.5 cm. Aortic diameter in the distal arch is 3.9 cm. In the proximal descending aorta, it measures 4.3 cm. There is irregular plaque along the distal arch and descending thoracic aorta. There is also a penetrating ulcer with a saccular aneurysm extending inferiorly from the distal arch. The neck of the penetrating ulcer is 2.7 cm. The small aneurysm extends 11 mm beyond the expected lumen of the  vessel. Tiny chronic appearing dissection flaps are seen at the proximal and distal extent of the penetrating ulcer. Atherosclerotic changes of the great vessels are noted without significant focal narrowing. Vertebral arteries are also patent within the confines of the examination. No central pulmonary thromboemboli are identified. Three vessel coronary artery calcification. No abnormal mediastinal adenopathy. No axillary adenopathy. Thyroid gland is unremarkable. Prominent asymmetric density in the right breast measures 3.3 x 2.1 cm Dependent atelectasis bilaterally. Mild centrilobular emphysema is present. There is a linear area with central  nodularity on image 28 at the left apex. This extends towards the central left upper lobe with cavitary areas and calcification. Other calcified granulomata in the left upper lobe are noted. No acute bony deformity. Review of the MIP images confirms the above findings. CTA ABDOMEN AND PELVIS FINDINGS Aneurysmal dilatation of the abdominal aorta is present. At the diaphragmatic hiatus, maximal diameter is 4.3 cm. My direct measurements of the prior study result in in a previous measurement of 3.7 cm. Aneurysmal dilatation of the juxtarenal aorta measures 4.6 cm in transverse diameter and 4.2 cm in AP diameter. Previous maximal diameter was 4.0 cm. There is chronic smooth mural thrombus within the aneurysmal segment. Narrowing at the origin of the celiac axis is present. 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 Branch vessels are patent. SMA is patent. Calcified plaque at the origin of the right renal artery is noted. Significant narrowing cannot be excluded. Mild atherosclerotic changes at the origin of the left renal artery. IMA origin has been sacrificed. Branch vessels reconstitute and are diminutive. Graft anastomosis to the distal right common iliac artery is patent. Mild narrowing at the origin of the right external iliac artery. Atherosclerotic calcifications are present without significant focal narrowing. Diffuse atherosclerotic changes of the right internal iliac artery, which is mildly ectatic. Left graft to common iliac artery anastomosis is patent. Diffuse atherosclerotic calcifications of the left iliac arterial vasculature without significant focal narrowing. Left internal iliac artery is mildly ectatic. Gallstones are suspected. Pneumobilia is present suggesting previous biliary instrumentation. This is stable. No liver lesion. Spleen, pancreas, adrenal glands are within normal limits Several benign appearing cysts are present in the kidneys bilaterally. There are  calculi within the right kidney. The largest is in the lower pole measuring 6 mm. Ventral abdominal hernia is not significantly changed with a wide neck. Transverse colon extends into the hernia sac. Bladder, uterus, and adnexa are within normal limits for age No free-fluid.  No abnormal retroperitoneal adenopathy. L4 compression deformity is stable. Review of the MIP images confirms the above findings. IMPRESSION: Aneurysmal dilatation of the thoracic aorta measures 5.5 cm in the ascending aorta. Recommend semi-annual imaging followup by CTA or MRA and referral to cardiothoracic surgery if not already obtained. This recommendation follows 2010 ACCF/AHA/AATS/ACR/ASA/SCA/SCAI/SIR/STS/SVM Guidelines for the Diagnosis and Management of Patients With Thoracic Aortic Disease. Circulation. 2010; 121: [id number] There is a wide neck penetrating atherosclerotic ulcer in the distal aortic arch extending inferiorly that is relatively prominent as described. Asymmetric density in the right breast as described. Correlation with mammography is recommended Pulmonary parenchymal changes at the left apex are most consistent with chronic granulomatous disease. Underlying malignancy is not entirely excluded without prior imaging for comparison. Initial three-month follow-up and subsequent annual follow-up is recommended. Juxtarenal aneurysmal dilatation of the abdominal aorta has increased from 4.0 cm to 4.6 cm. Recommend followup by abdomen and pelvis CTA in 6 months, and vascular surgery referral/consultation if not already obtained. This  recommendation follows ACR consensus guidelines: White Paper of the ACR Incidental Findings Committee II on Vascular Findings. J Am Coll Radiol 2013; 10:789-794. Significant narrowing at the origin of the right renal artery cannot be excluded. Renal artery Doppler study may be helpful. Aorto bi-iliac graft repair is patent. Possible gallstones.  Ultrasound is recommended. Right nephrolithiasis.  Ventral hernia. Electronically Signed   By: Marybelle Killings M.D.   On: 06/23/2015 13:06     Assessment & Plan:  Plan I am having Deanna [name] start on NONFORMULARY OR COMPOUNDED ITEM. I am also having her maintain her diclofenac sodium, fenofibrate, losartan, and traMADol.  Meds ordered this encounter  Medications  . NONFORMULARY OR COMPOUNDED ITEM    Sig: Wheelchair  #1 as directed Dx    Dispense:  1 each    Refill:  0    Dx weakness in legs--- hx falls    Problem List Items Addressed This Visit      Unprioritized   Essential hypertension - Primary    con't losartan rto 3 months      Leg weakness, bilateral    Finishing up PT Use walker Wheelchair when she is tired      Relevant Medications   NONFORMULARY OR COMPOUNDED ITEM    Other Visit Diagnoses    Falls, initial encounter        Relevant Medications    NONFORMULARY OR COMPOUNDED ITEM       Follow-up: Return in about 3 months (around 10/08/2015), or if symptoms worsen or fail to improve, for hypertension.  Ann Held, DO

## 2015-07-09 NOTE — Progress Notes (Signed)
Pre visit review using our clinic review tool, if applicable. No additional management support is needed unless otherwise documented below in the visit note. 

## 2015-07-09 NOTE — Patient Instructions (Signed)
Hypertension Hypertension, commonly called high blood pressure, is when the force of blood pumping through your arteries is too strong. Your arteries are the blood vessels that carry blood from your heart throughout your body. A blood pressure reading consists of a higher number over a lower number, such as 110/72. The higher number (systolic) is the pressure inside your arteries when your heart pumps. The lower number (diastolic) is the pressure inside your arteries when your heart relaxes. Ideally you want your blood pressure below 120/80. Hypertension forces your heart to work harder to pump blood. Your arteries may become narrow or stiff. Having untreated or uncontrolled hypertension can cause heart attack, stroke, kidney disease, and other problems. RISK FACTORS Some risk factors for high blood pressure are controllable. Others are not.  Risk factors you cannot control include:   Race. You may be at higher risk if you are African American.  Age. Risk increases with age.  Gender. Men are at higher risk than women before age 45 years. After age 65, women are at higher risk than men. Risk factors you can control include:  Not getting enough exercise or physical activity.  Being overweight.  Getting too much fat, sugar, calories, or salt in your diet.  Drinking too much alcohol. SIGNS AND SYMPTOMS Hypertension does not usually cause signs or symptoms. Extremely high blood pressure (hypertensive crisis) may cause headache, anxiety, shortness of breath, and nosebleed. DIAGNOSIS To check if you have hypertension, your health care provider will measure your blood pressure while you are seated, with your arm held at the level of your heart. It should be measured at least twice using the same arm. Certain conditions can cause a difference in blood pressure between your right and left arms. A blood pressure reading that is higher than normal on one occasion does not mean that you need treatment. If  it is not clear whether you have high blood pressure, you may be asked to return on a different day to have your blood pressure checked again. Or, you may be asked to monitor your blood pressure at home for 1 or more weeks. TREATMENT Treating high blood pressure includes making lifestyle changes and possibly taking medicine. Living a healthy lifestyle can help lower high blood pressure. You may need to change some of your habits. Lifestyle changes may include:  Following the DASH diet. This diet is high in fruits, vegetables, and whole grains. It is low in salt, red meat, and added sugars.  Keep your sodium intake below 2,300 mg per day.  Getting at least 30-45 minutes of aerobic exercise at least 4 times per week.  Losing weight if necessary.  Not smoking.  Limiting alcoholic beverages.  Learning ways to reduce stress. Your health care provider may prescribe medicine if lifestyle changes are not enough to get your blood pressure under control, and if one of the following is true:  You are 18-59 years of age and your systolic blood pressure is above 140.  You are 60 years of age or older, and your systolic blood pressure is above 150.  Your diastolic blood pressure is above 90.  You have diabetes, and your systolic blood pressure is over 140 or your diastolic blood pressure is over 90.  You have kidney disease and your blood pressure is above 140/90.  You have heart disease and your blood pressure is above 140/90. Your personal target blood pressure may vary depending on your medical conditions, your age, and other factors. HOME CARE INSTRUCTIONS    Have your blood pressure rechecked as directed by your health care provider.   Take medicines only as directed by your health care provider. Follow the directions carefully. Blood pressure medicines must be taken as prescribed. The medicine does not work as well when you skip doses. Skipping doses also puts you at risk for  problems.  Do not smoke.   Monitor your blood pressure at home as directed by your health care provider. SEEK MEDICAL CARE IF:   You think you are having a reaction to medicines taken.  You have recurrent headaches or feel dizzy.  You have swelling in your ankles.  You have trouble with your vision. SEEK IMMEDIATE MEDICAL CARE IF:  You develop a severe headache or confusion.  You have unusual weakness, numbness, or feel faint.  You have severe chest or abdominal pain.  You vomit repeatedly.  You have trouble breathing. MAKE SURE YOU:   Understand these instructions.  Will watch your condition.  Will get help right away if you are not doing well or get worse.   This information is not intended to replace advice given to you by your health care provider. Make sure you discuss any questions you have with your health care provider.   Document Released: 03/21/2005 Document Revised: 08/05/2014 Document Reviewed: 01/11/2013 Elsevier Interactive Patient Education 2016 [employer]  

## 2015-07-11 NOTE — Assessment & Plan Note (Signed)
Finishing up PT Use walker Wheelchair when she is tired

## 2015-07-11 NOTE — Assessment & Plan Note (Signed)
con't losartan rto 3 months

## 2015-08-03 DIAGNOSIS — S065XAA Traumatic subdural hemorrhage with loss of consciousness status unknown, initial encounter: Secondary | ICD-10-CM

## 2015-08-03 DIAGNOSIS — S065X9A Traumatic subdural hemorrhage with loss of consciousness of unspecified duration, initial encounter: Secondary | ICD-10-CM

## 2015-08-03 HISTORY — DX: Traumatic subdural hemorrhage with loss of consciousness of unspecified duration, initial encounter: S06.5X9A

## 2015-08-03 HISTORY — DX: Traumatic subdural hemorrhage with loss of consciousness status unknown, initial encounter: S06.5XAA

## 2015-08-29 ENCOUNTER — Encounter: Payer: Self-pay | Admitting: Family Medicine

## 2015-08-29 DIAGNOSIS — S065XAA Traumatic subdural hemorrhage with loss of consciousness status unknown, initial encounter: Secondary | ICD-10-CM | POA: Insufficient documentation

## 2015-08-29 DIAGNOSIS — S065X9A Traumatic subdural hemorrhage with loss of consciousness of unspecified duration, initial encounter: Secondary | ICD-10-CM | POA: Insufficient documentation

## 2015-08-29 DIAGNOSIS — T1490XA Injury, unspecified, initial encounter: Secondary | ICD-10-CM | POA: Insufficient documentation

## 2015-08-29 DIAGNOSIS — I6200 Nontraumatic subdural hemorrhage, unspecified: Secondary | ICD-10-CM | POA: Insufficient documentation

## 2015-09-02 DIAGNOSIS — K839 Disease of biliary tract, unspecified: Secondary | ICD-10-CM | POA: Insufficient documentation

## 2015-09-02 DIAGNOSIS — I712 Thoracic aortic aneurysm, without rupture: Secondary | ICD-10-CM | POA: Insufficient documentation

## 2015-09-02 DIAGNOSIS — I7123 Aneurysm of the descending thoracic aorta, without rupture: Secondary | ICD-10-CM | POA: Insufficient documentation

## 2015-09-04 DIAGNOSIS — N390 Urinary tract infection, site not specified: Secondary | ICD-10-CM | POA: Insufficient documentation

## 2015-09-04 DIAGNOSIS — N201 Calculus of ureter: Secondary | ICD-10-CM | POA: Insufficient documentation

## 2015-09-08 NOTE — Telephone Encounter (Addendum)
Message sent to grand daughter.   KP

## 2015-09-08 NOTE — Telephone Encounter (Signed)
We need to make sure I see the d/c summary and its not stuck in the middle of all the paperwork.  We can put referral in once I see the d/c summary.

## 2015-09-09 ENCOUNTER — Encounter: Payer: Self-pay | Admitting: Internal Medicine

## 2015-09-09 ENCOUNTER — Non-Acute Institutional Stay (SKILLED_NURSING_FACILITY): Payer: Medicare Other | Admitting: Internal Medicine

## 2015-09-09 DIAGNOSIS — N201 Calculus of ureter: Secondary | ICD-10-CM | POA: Diagnosis not present

## 2015-09-09 DIAGNOSIS — I6200 Nontraumatic subdural hemorrhage, unspecified: Secondary | ICD-10-CM | POA: Diagnosis not present

## 2015-09-09 DIAGNOSIS — S065X9A Traumatic subdural hemorrhage with loss of consciousness of unspecified duration, initial encounter: Secondary | ICD-10-CM

## 2015-09-09 DIAGNOSIS — R630 Anorexia: Secondary | ICD-10-CM

## 2015-09-09 DIAGNOSIS — N3000 Acute cystitis without hematuria: Secondary | ICD-10-CM

## 2015-09-09 DIAGNOSIS — S065XAA Traumatic subdural hemorrhage with loss of consciousness status unknown, initial encounter: Secondary | ICD-10-CM

## 2015-09-09 DIAGNOSIS — I10 Essential (primary) hypertension: Secondary | ICD-10-CM

## 2015-09-09 DIAGNOSIS — R400 Somnolence: Secondary | ICD-10-CM

## 2015-09-09 DIAGNOSIS — R531 Weakness: Secondary | ICD-10-CM | POA: Diagnosis not present

## 2015-09-09 DIAGNOSIS — K5900 Constipation, unspecified: Secondary | ICD-10-CM | POA: Diagnosis not present

## 2015-09-09 NOTE — Progress Notes (Signed)
LOCATION: [facility]  PCP: Ann Held, DO   Code Status: DNR  Goals of care: Advanced Directive information Advanced Directives 09/09/2015  Does patient have an advance directive? Yes  Type of Advance Directive Out of facility DNR (pink MOST or yellow form)  Does patient want to make changes to advanced directive? No - Patient declined  Copy of advanced directive(s) in chart? Yes       Extended Emergency Contact Information Primary Emergency Contact: Lipke,Angela Address: [place], [place] [address] [place] Phone: [phone] Relation: Daughter Secondary Emergency Contact: Jana [place] Phone: [phone] Relation: Relative   Allergies  Allergen Reactions  . Crestor [Rosuvastatin]   . Ezetimibe Other (See Comments)    Chief Complaint  Patient presents with  . New Admit To SNF    New Admission     HPI:  Patient is a 82 y.o. female seen today for short term rehabilitation post hospital admission from 08/29/15-09/08/15 with right SDH. She underwent right craniotomy and evacuation of SDH on 09/01/15. She was treated for UTI this admission. Post op, she had AMS and had imaging of her brain, EEG done that were negative for acute findings. She was noted to have incomplete bladder emptying with ureteral stones and urology was consulted. She had foley placed and underwent cystoscopy on 09/04/15. She then had right ureteral stent placed and had her foley catheter removed and was voiding well. She is on a total 2 week antibiotic course for UTI. She is seen in her room today with her daughter at bedside. She has been somnolent post therapy this am and is not participating in HPI and ROS. Per staff, no fever reported. Has poor po intake. No fall reported.   Review of Systems: unable to obtain    Past Medical History  Diagnosis Date  . HLD (hyperlipidemia)   . HTN (hypertension)   . AAA (abdominal  aortic aneurysm) [facility]) 2011    Dr Early repaired this  . Cirrhosis, biliary ([place])     liver bx 01/2008  . Fatigue   . Epistaxis   . OSA (obstructive sleep apnea)     mild on ss of 12/2008  . Leg pain     bilateral  . GERD (gastroesophageal reflux disease)   . Pneumonia   . Bleeding ulcer 1980s    H. Pylori  . Acute ischemic colitis ([place]) 03/06/2012  . Anemia   . Arthritis     bilateral hands and knees  . Blood transfusion without reported diagnosis   . Cataract     In the past  . Diastolic CHF ([place]) [id number]   Past Surgical History  Procedure Laterality Date  . 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 Cataract extraction    . Breast biopsy    . Colonoscopy  03/08/2012    Procedure: COLONOSCOPY;  Surgeon: Gatha Mayer, MD;  Location: [facility];  Service: Endoscopy;  Laterality: N/A;  . Abdominal aortic aneurysm repair  02/01/2010  . Ercp N/A 04/20/2013    Procedure: ENDOSCOPIC RETROGRADE CHOLANGIOPANCREATOGRAPHY (ERCP);  Surgeon: Ladene Artist, MD;  Location: [facility];  Service: Endoscopy;  Laterality: N/A;   Social History:   reports that she quit smoking about 5 years ago. Her smoking use included Cigarettes. She quit after 52 years of use. She has never used  smokeless tobacco. She reports that she does not drink alcohol or use illicit drugs.  Family History  Problem Relation Age of Onset  . Cancer Brother     prostate    Medications:   Medication List       This list is accurate as of: 09/09/15  1:23 PM.  Always use your most recent med list.               acetaminophen 500 MG tablet  Commonly known as:  TYLENOL  Take 500 mg by mouth every 4 (four) hours as needed for mild pain.     amLODipine 5 MG tablet  Commonly known as:  NORVASC  Take 5 mg by mouth daily.     bisacodyl 10 MG suppository  Commonly known as:  DULCOLAX  Place 10 mg rectally daily as needed for moderate constipation.     carboxymethylcellulose 0.5 % Soln    Commonly known as:  REFRESH PLUS  Place 1 drop into both eyes every 6 (six) hours as needed.     docusate sodium 100 MG capsule  Commonly known as:  COLACE  Take 100 mg by mouth daily.     fluorouracil 5 % cream  Commonly known as:  EFUDEX  Apply 1 application topically daily.     Heparin (Porcine) in NaCl 15000-0.9 UT/500ML-% Soln  Inject 1 mL into the vein 2 (two) times daily.     levofloxacin 750 MG tablet  Commonly known as:  LEVAQUIN  Take 750 mg by mouth daily.     losartan 100 MG tablet  Commonly known as:  COZAAR  Take 1 tablet (100 mg total) by mouth daily.     magnesium hydroxide 400 MG/5ML suspension  Commonly known as:  MILK OF MAGNESIA  Take 30 mLs by mouth daily as needed for mild constipation.     Melatonin 3 MG Tabs  Take 2 tablets by mouth at bedtime.     NONFORMULARY OR COMPOUNDED ITEM  Wheelchair  #1 as directed Dx        Immunizations: Immunization History  Administered Date(s) Administered  . Hepatitis B 01/08/2010  . Influenza Whole 02/21/2007, 12/20/2007, 12/11/2009, 11/30/2010, 12/19/2012  . Influenza, High Dose Seasonal PF 12/14/2013  . Influenza-Unspecified 12/04/2014  . Pneumococcal Conjugate-13 06/25/2015  . Pneumococcal Polysaccharide-23 04/04/2004, 04/20/2005  . Td 04/04/2004     Physical Exam: Filed Vitals:   09/09/15 1315  BP: 151/75  Pulse: 82  Temp: 96.8 F (36 C)  TempSrc: Oral  Resp: 16  Height: 5\' 8"  (1.727 m)  Weight: 211 lb 9.6 oz (95.981 kg)  SpO2: 96%   Body mass index is 32.18 kg/(m^2).  General- elderly female, obese, in no acute distress Head- normocephalic, surgical incision to right scalp with sutures in place Nose- no maxillary or frontal sinus tenderness, no nasal discharge Throat- moist mucus membrane Eyes- PERRLA, EOMI, no pallor, no icterus, no discharge, normal conjunctiva, normal sclera Neck- no cervical lymphadenopathy Cardiovascular- normal s1,s2, no murmur, trace leg edema Respiratory-  bilateral clear to auscultation, no wheeze, no rhonchi, no crackles, no use of accessory muscles Abdomen- bowel sounds present, soft, non tender Musculoskeletal- able to move all 4 extremities, generalized weakness Neurological- answers some question with one word answer, somnolent Skin- warm and dry   Labs reviewed: Basic Metabolic Panel:  Recent Labs  11/27/14 1602 06/22/15 1609  NA 136  --   K 4.2  --   CL 101  --   CO2 27  --  GLUCOSE 97  --   BUN 24*  --   CREATININE 1.19 1.27*  CALCIUM 9.8  --    Liver Function Tests:  Recent Labs  11/27/14 1602  AST 21  ALT 12  ALKPHOS 72  BILITOT 0.6  PROT 7.6  ALBUMIN 3.5     Assessment/Plan  Generalized weakness From deconditioning. Will have her work with physical therapy and occupational therapy team to help with gait training and muscle strengthening exercises.fall precautions. Skin care. Encourage to be out of bed.   SDH S/p craniotomy and evacuation/ will have her sutures removed tomorrow. Has f/u with neurosurgery. will monitor her neurologically. Avoid sedative drugs. Check VS q shift x 1 week. Continue tylenol as needed for headache. Currently on heparin for dvt prophylaxis  somnolence With some confusion. Per daughter she has some confusion since post surgery. Per therapy team, she worked with them this am. Get lab work to rule out metabolic etiology. Monitor VS. To provide assistance with feed for now and will have SLP to evaluate  Ureteral stones S/p stent. Monitor urine output. Need urology follow up  UTI Continue and complete her course of levaquin for now, maintain hydration  Poor po intake Get dietary consult  HTN Monitor bp, continue amlodipine 5 mg daily and losartan 100 mg daily, check bmp  Constipation Continue current bowel regimen and monitor   Goals of care: short term rehabilitation   Labs/tests ordered: cbc, cmp, tsh 09/10/15  Family/ staff Communication: reviewed care plan with  patient and nursing supervisor    Blanchie Serve, MD Internal Medicine [place], [place] [address] Cell Phone (Monday-Friday 8 am - 5 pm): [phone] On Call: [phone] and follow prompts after 5 pm and on weekends Office Phone: [phone] Office Fax: [phone]

## 2015-09-10 LAB — BASIC METABOLIC PANEL
BUN: 23 mg/dL — AB (ref 4–21)
CREATININE: 1.2 mg/dL — AB (ref 0.5–1.1)
GLUCOSE: 101 mg/dL
POTASSIUM: 4 mmol/L (ref 3.4–5.3)
SODIUM: 137 mmol/L (ref 137–147)

## 2015-09-10 LAB — TSH: TSH: 1.54 u[IU]/mL (ref 0.41–5.90)

## 2015-09-10 LAB — HEPATIC FUNCTION PANEL
ALT: 11 U/L (ref 7–35)
AST: 19 U/L (ref 13–35)
Alkaline Phosphatase: 226 U/L — AB (ref 25–125)
Bilirubin, Total: 0.8 mg/dL

## 2015-09-10 LAB — CBC AND DIFFERENTIAL
HEMATOCRIT: 36 % (ref 36–46)
HEMOGLOBIN: 11.5 g/dL — AB (ref 12.0–16.0)
Neutrophils Absolute: 8 /uL
Platelets: 332 10*3/uL (ref 150–399)
WBC: 10.6 10^3/mL

## 2015-09-10 NOTE — Telephone Encounter (Signed)
To MD to review,    KP

## 2015-09-25 ENCOUNTER — Non-Acute Institutional Stay (SKILLED_NURSING_FACILITY): Payer: Medicare Other | Admitting: Adult Health

## 2015-09-25 ENCOUNTER — Encounter: Payer: Self-pay | Admitting: Adult Health

## 2015-09-25 DIAGNOSIS — I6200 Nontraumatic subdural hemorrhage, unspecified: Secondary | ICD-10-CM

## 2015-09-25 DIAGNOSIS — I10 Essential (primary) hypertension: Secondary | ICD-10-CM | POA: Diagnosis not present

## 2015-09-25 DIAGNOSIS — N201 Calculus of ureter: Secondary | ICD-10-CM

## 2015-09-25 DIAGNOSIS — G4700 Insomnia, unspecified: Secondary | ICD-10-CM

## 2015-09-25 DIAGNOSIS — S065XAA Traumatic subdural hemorrhage with loss of consciousness status unknown, initial encounter: Secondary | ICD-10-CM

## 2015-09-25 DIAGNOSIS — R531 Weakness: Secondary | ICD-10-CM | POA: Diagnosis not present

## 2015-09-25 DIAGNOSIS — K5900 Constipation, unspecified: Secondary | ICD-10-CM

## 2015-09-25 DIAGNOSIS — S065X9A Traumatic subdural hemorrhage with loss of consciousness of unspecified duration, initial encounter: Secondary | ICD-10-CM

## 2015-09-25 NOTE — Progress Notes (Signed)
Patient ID: Jamyla Montera, female   DOB: 1930/03/29, 82 y.o.   MRN: [id number]     DATE:  09/25/2015   MRN:  [id number]  BIRTHDAY: [birth year]  Facility:  [facility] Location:  [facility] Room Number: 307-P  LEVEL OF CARE:  SNF (31)  Contact Information    Name Relation Home Work Mobile   Lipke,Angela Daughter [phone]     Lipke,Bob Relative [phone]         Code Status History    Date Active Date Inactive Code Status Order ID Comments User Context   04/20/2013  8:13 AM 04/23/2013  8:58 PM DNR [id number]  Caren Griffins, MD ED   03/06/2012  9:34 PM 03/08/2012  9:21 PM Full Code [id number]  Ethel Rana, RN Inpatient    Advance Directive Documentation        Most Recent Value   Type of Advance Directive  Out of facility DNR (pink MOST or yellow form)   Pre-existing out of facility DNR order (yellow form or pink MOST form)     "MOST" Form in Place?         Chief Complaint  Patient presents with  . Discharge Note    HISTORY OF PRESENT ILLNESS:  This is an 82 year old female who is for discharge home with home health OT, PT, CNA and nurse for heparin injections. DME:  Standard wheelchair and rolling walker.  She has been admitted to [facility] on 09/08/15 from [facility] with right SDH she underwent right craniotomy and evacuation of SDH on 09/01/15. She was treated for UTI this admission. Postop, she has AMS and had imaging of her brain, EEG done were negative for acute findings. She was noted to have incomplete bladder emptying with ureteral stones and urology was consulted. She had Foley catheter placed and underwent cystoscopy on 09/04/15. She then had right ureteral stent placed and had her Foley catheter removed and was voiding well. She had a total 2 week course of antibiotic for UTI.  Patient was admitted to this facility for short-term rehabilitation after the patient's recent hospitalization.  Patient has  completed SNF rehabilitation and therapy has cleared the patient for discharge.  PAST MEDICAL HISTORY:  Past Medical History  Diagnosis Date  . HLD (hyperlipidemia)   . HTN (hypertension)   . AAA (abdominal aortic aneurysm) [facility]) 2011    Dr Early repaired this  . Cirrhosis, biliary ([place])     liver bx 01/2008  . Fatigue   . Epistaxis   . OSA (obstructive sleep apnea)     mild on ss of 12/2008  . Leg pain     bilateral  . GERD (gastroesophageal reflux disease)   . Pneumonia   . Bleeding ulcer 1980s    H. Pylori  . Acute ischemic colitis ([place]) 03/06/2012  . Anemia   . Arthritis     bilateral hands and knees  . Blood transfusion without reported diagnosis   . Cataract     In the past  . Diastolic CHF ([place]) XX123456     CURRENT MEDICATIONS: Reviewed  Patient's Medications  New Prescriptions   No medications on file  Previous Medications   ACETAMINOPHEN (TYLENOL) 500 MG TABLET    Take 500 mg by mouth every 4 (four) hours as needed for mild pain.   AMLODIPINE (NORVASC) 5 MG TABLET    Take 5 mg by mouth daily.   BISACODYL (DULCOLAX) 10 MG SUPPOSITORY  Place 10 mg rectally daily as needed for moderate constipation.   CARBOXYMETHYLCELLULOSE (REFRESH PLUS) 0.5 % SOLN    Place 1 drop into both eyes every 6 (six) hours as needed.   DOCUSATE SODIUM (COLACE) 100 MG CAPSULE    Take 100 mg by mouth daily.   FLUOROURACIL (EFUDEX) 5 % CREAM    Apply 1 application topically daily.   HEPARIN, PORCINE, IN NACL 15000-0.9 UT/500ML-% SOLN    Inject 1 mL into the vein 2 (two) times daily.   LOSARTAN (COZAAR) 100 MG TABLET    Take 1 tablet (100 mg total) by mouth daily.   MAGNESIUM HYDROXIDE (MILK OF MAGNESIA) 400 MG/5ML SUSPENSION    Take 30 mLs by mouth daily as needed for mild constipation.   MELATONIN 3 MG TABS    Take 2 tablets by mouth at bedtime.   NONFORMULARY OR COMPOUNDED ITEM    Wheelchair  #1 as directed Dx  Modified Medications   No medications on file  Discontinued Medications    LEVOFLOXACIN (LEVAQUIN) 750 MG TABLET    Take 750 mg by mouth daily.     Allergies  Allergen Reactions  . Crestor [Rosuvastatin]   . Ezetimibe Other (See Comments)     REVIEW OF SYSTEMS:  GENERAL: no change in appetite, no fatigue, no weight changes, no fever, chills or weakness EYES: Denies change in vision, dry eyes, eye pain, itching or discharge EARS: Denies change in hearing, ringing in ears, or earache NOSE: Denies nasal congestion or epistaxis MOUTH and THROAT: Denies oral discomfort, gingival pain or bleeding, pain from teeth or hoarseness   RESPIRATORY: no cough, SOB, DOE, wheezing, hemoptysis CARDIAC: no chest pain, edema or palpitations GI: no abdominal pain, diarrhea, constipation, heart burn, nausea or vomiting GU: Denies dysuria, frequency, hematuria, incontinence, or discharge PSYCHIATRIC: Denies feeling of depression or anxiety. No report of hallucinations, insomnia, paranoia, or agitation     PHYSICAL EXAMINATION  GENERAL APPEARANCE: Well nourished. In no acute distress. Obese SKIN:  Right side of head scalp surgical incision is dry, no erythema, left forehead with dry scaly area HEAD: Normal in size and contour. No evidence of trauma EYES: Lids open and close normally. No blepharitis, entropion or ectropion. PERRL. Conjunctivae are clear and sclerae are white. Lenses are without opacity EARS: Pinnae are normal. Patient hears normal voice tunes of the examiner MOUTH and THROAT: Lips are without lesions. Oral mucosa is moist and without lesions. Tongue is normal in shape, size, and color and without lesions NECK: supple, trachea midline, no neck masses, no thyroid tenderness, no thyromegaly LYMPHATICS: no LAN in the neck, no supraclavicular LAN RESPIRATORY: breathing is even & unlabored, BS CTAB CARDIAC: RRR, no murmur,no extra heart sounds, no edema GI: abdomen soft, normal BS, no masses, no tenderness, no hepatomegaly, no splenomegaly EXTREMITIES:  Able to  move 4 extremities PSYCHIATRIC: Alert and oriented X 3. Affect and behavior are appropriate  LABS/RADIOLOGY: Labs reviewed: Basic Metabolic Panel:  Recent Labs  11/27/14 1602 06/22/15 1609 09/10/15  NA 136  --  137  K 4.2  --  4.0  CL 101  --   --   CO2 27  --   --   GLUCOSE 97  --   --   BUN 24*  --  23*  CREATININE 1.19 1.27* 1.2*  CALCIUM 9.8  --   --    Liver Function Tests:  Recent Labs  11/27/14 1602 09/10/15  AST 21 19  ALT 12 11  ALKPHOS  72 226*  BILITOT 0.6  --   PROT 7.6  --   ALBUMIN 3.5  --    CBC:  Recent Labs  09/10/15  WBC 10.6  NEUTROABS 8  HGB 11.5*  HCT 36  PLT 332   Lipid Panel:  Recent Labs  11/27/14 1602  HDL 28.80*     ASSESSMENT/PLAN:  Generalized weakness - for home health OT, PT, CNA and Nurse  SDH S/P craniotomy and evacuation - follow-up with neurosurgery; continue heparin for DVT prophylaxis; acetaminophen 500 mg 1 tab by mouth every 4 hours when necessary for pain  Ureteral  Stones S/P stent - follow-up with urology; no complaints in voiding  Hypertension - well controlled; continue Norvasc 5 mg 1 tab by mouth daily and losartan potassium 100 mg 1 tab by mouth daily  Constipation - continue Dulcolax 10 mg suppository 1 per rectum daily when necessary and Colace 100 mg 1 capsule by mouth daily  Insomnia - continue melatonin 3 mg take 2 tabs = 6 mg by mouth daily at bedtime     I have filled out patient's discharge paperwork and written prescriptions.  Patient will receive home health PT, OT, Nursing and CNA.  DME provided:  Standard wheelchair and rolling walker  Total discharge time: Greater than 30 minutes  Discharge time involved coordination of the discharge process with social worker, nursing staff and therapy department. Medical justification for home health services/DME verified.     Durenda Age, NP [employer] [phone]

## 2015-09-27 DIAGNOSIS — I110 Hypertensive heart disease with heart failure: Secondary | ICD-10-CM | POA: Diagnosis not present

## 2015-09-27 DIAGNOSIS — M170 Bilateral primary osteoarthritis of knee: Secondary | ICD-10-CM

## 2015-09-27 DIAGNOSIS — I5030 Unspecified diastolic (congestive) heart failure: Secondary | ICD-10-CM

## 2015-09-27 DIAGNOSIS — I6200 Nontraumatic subdural hemorrhage, unspecified: Secondary | ICD-10-CM

## 2015-09-29 ENCOUNTER — Encounter: Payer: Self-pay | Admitting: Family Medicine

## 2015-09-29 ENCOUNTER — Ambulatory Visit (INDEPENDENT_AMBULATORY_CARE_PROVIDER_SITE_OTHER): Payer: Medicare Other | Admitting: Family Medicine

## 2015-09-29 VITALS — BP 100/60 | HR 87 | Temp 97.7°F | Wt 200.0 lb

## 2015-09-29 DIAGNOSIS — R7989 Other specified abnormal findings of blood chemistry: Secondary | ICD-10-CM

## 2015-09-29 DIAGNOSIS — S065X9D Traumatic subdural hemorrhage with loss of consciousness of unspecified duration, subsequent encounter: Secondary | ICD-10-CM | POA: Diagnosis not present

## 2015-09-29 DIAGNOSIS — R748 Abnormal levels of other serum enzymes: Secondary | ICD-10-CM | POA: Diagnosis not present

## 2015-09-29 DIAGNOSIS — E785 Hyperlipidemia, unspecified: Secondary | ICD-10-CM

## 2015-09-29 DIAGNOSIS — D500 Iron deficiency anemia secondary to blood loss (chronic): Secondary | ICD-10-CM

## 2015-09-29 DIAGNOSIS — N200 Calculus of kidney: Secondary | ICD-10-CM

## 2015-09-29 DIAGNOSIS — S065X9A Traumatic subdural hemorrhage with loss of consciousness of unspecified duration, initial encounter: Secondary | ICD-10-CM

## 2015-09-29 LAB — COMPREHENSIVE METABOLIC PANEL
ALT: 16 U/L (ref 0–35)
AST: 19 U/L (ref 0–37)
Albumin: 3.5 g/dL (ref 3.5–5.2)
Alkaline Phosphatase: 350 U/L — ABNORMAL HIGH (ref 39–117)
BUN: 23 mg/dL (ref 6–23)
CALCIUM: 10.3 mg/dL (ref 8.4–10.5)
CHLORIDE: 103 meq/L (ref 96–112)
CO2: 29 meq/L (ref 19–32)
CREATININE: 1.08 mg/dL (ref 0.40–1.20)
GFR: 51.36 mL/min — ABNORMAL LOW (ref >60.00)
GLUCOSE: 113 mg/dL — AB (ref 70–99)
Potassium: 4.6 mEq/L (ref 3.5–5.1)
SODIUM: 138 meq/L (ref 135–145)
Total Bilirubin: 0.5 mg/dL (ref 0.2–1.2)
Total Protein: 7.4 g/dL (ref 6.0–8.3)

## 2015-09-29 LAB — CBC WITH DIFFERENTIAL/PLATELET
BASOS ABS: 0 10*3/uL (ref 0.0–0.1)
Basophils Relative: 0.4 % (ref 0.0–3.0)
EOS ABS: 0 10*3/uL (ref 0.0–0.7)
Eosinophils Relative: 0.3 % (ref 0.0–5.0)
HEMATOCRIT: 39.9 % (ref 36.0–46.0)
Hemoglobin: 13 g/dL (ref 12.0–15.0)
LYMPHS PCT: 32.3 % (ref 12.0–46.0)
Lymphs Abs: 2.4 10*3/uL (ref 0.7–4.0)
MCHC: 32.5 g/dL (ref 30.0–36.0)
MCV: 88.2 fl (ref 78.0–100.0)
Monocytes Absolute: 0.6 10*3/uL (ref 0.1–1.0)
Monocytes Relative: 7.8 % (ref 3.0–12.0)
NEUTROS ABS: 4.4 10*3/uL (ref 1.4–7.7)
NEUTROS PCT: 59.2 % (ref 43.0–77.0)
PLATELETS: 291 10*3/uL (ref 150.0–400.0)
RBC: 4.53 Mil/uL (ref 3.87–5.11)
RDW: 16.4 % — ABNORMAL HIGH (ref 11.5–15.5)
WBC: 7.5 10*3/uL (ref 4.0–10.5)

## 2015-09-29 LAB — LDL CHOLESTEROL, DIRECT: Direct LDL: 125 mg/dL

## 2015-09-29 LAB — LIPID PANEL
Cholesterol: 221 mg/dL — ABNORMAL HIGH (ref 0–200)
HDL: 35.5 mg/dL — AB (ref >39.00)
NONHDL: 185.64
TRIGLYCERIDES: 322 mg/dL — AB (ref 0.0–149.0)
Total CHOL/HDL Ratio: 6
VLDL: 64.4 mg/dL — ABNORMAL HIGH (ref 0.0–40.0)

## 2015-09-29 NOTE — Progress Notes (Signed)
Patient ID: Deanna [name], female    DOB: 09-Mar-1930  Age: 82 y.o. MRN: [id number]    Subjective:  Subjective HPI Kalyana Fluhr presents for f/u from hospital.  Pt tripped over her nieces dog and hit her head.  She was unconscious and had a subdural hematoma per pt .   She was told she would only have a few hours to live but she woke up while family was with her--- she states she saw her dead family members while she was unconscious but was told it was not time for her yet.  Pt went to [facility] from the hospital in [place] but was kicked out per daughter -- they told her she was noncompliant with therapy.  Daughter was upset because she had a brain injury and felt she needed more time.  Pt was d/c with heparin but no instructions on when to stop.  She was just told she needed f/u with a urologist for stents and neurosurgery to f/u from surgery.  No further complaints.    Review of Systems  Constitutional: Negative for diaphoresis, appetite change, fatigue and unexpected weight change.  Eyes: Negative for pain, redness and visual disturbance.  Respiratory: Negative for cough, chest tightness, shortness of breath and wheezing.   Cardiovascular: Negative for chest pain, palpitations and leg swelling.  Endocrine: Negative for cold intolerance, heat intolerance, polydipsia, polyphagia and polyuria.  Genitourinary: Negative for dysuria, frequency and difficulty urinating.  Neurological: Negative for dizziness, light-headedness, numbness and headaches.    History Past Medical History  Diagnosis Date  . HLD (hyperlipidemia)   . HTN (hypertension)   . AAA (abdominal aortic aneurysm) [facility] 2011    Dr Early repaired this  . 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 Fatigue   . Epistaxis   . OSA (obstructive sleep apnea)     mild on ss of 12/2008  . Leg pain     bilateral  . GERD (gastroesophageal reflux disease)   . Pneumonia   . Bleeding ulcer 1980s    H. Pylori  . Acute ischemic  colitis ([place]) 03/06/2012  . Anemia   . Arthritis     bilateral hands and knees  . Blood transfusion without reported diagnosis   . Cataract     In the past  . Diastolic CHF ([place]) [id number]    She has past surgical history that includes Iliac artery - femoral artery bypass graft (2011); Cataract extraction; Breast biopsy; Colonoscopy (03/08/2012); Abdominal aortic aneurysm repair (02/01/2010); and ERCP (N/A, 04/20/2013).   Her family history includes Cancer in her brother.She reports that she quit smoking about 5 years ago. Her smoking use included Cigarettes. She quit after 52 years of use. She has never used smokeless tobacco. She reports that she does not drink alcohol or use illicit drugs.  Current Outpatient Prescriptions on File Prior to Visit  Medication Sig Dispense Refill  . acetaminophen (TYLENOL) 500 MG tablet Take 500 mg by mouth every 4 (four) hours as needed for mild pain.    Marland Kitchen amLODipine (NORVASC) 5 MG tablet Take 5 mg by mouth daily.    Marland Kitchen docusate sodium (COLACE) 100 MG capsule Take 100 mg by mouth daily.    . Heparin, Porcine, in NaCl 15000-0.9 UT/500ML-% SOLN Inject 1 mL into the skin 2 (two) times daily.     Marland Kitchen losartan (COZAAR) 100 MG tablet Take 1 tablet (100 mg total) by mouth daily. 90 tablet 3  . magnesium hydroxide (MILK OF  MAGNESIA) 400 MG/5ML suspension Take 30 mLs by mouth daily as needed for mild constipation.     No current facility-administered medications on file prior to visit.     Objective:  Objective Physical Exam  Constitutional: She appears well-developed and well-nourished.  HENT:  Head: Normocephalic.    Eyes: Conjunctivae and EOM are normal.  Neck: Normal range of motion. Neck supple. No JVD present. Carotid bruit is not present. No thyromegaly present.  Cardiovascular: Normal rate, regular rhythm and normal heart sounds.   No murmur heard. Pulmonary/Chest: Effort normal and breath sounds normal. No respiratory distress. She has no wheezes.  She has no rales. She exhibits no tenderness.  Musculoskeletal: She exhibits edema.  Pt iin wheelchair   Neurological: She is alert.  Psychiatric: She has a normal mood and affect.  Nursing note and vitals reviewed.  BP 100/60 mmHg  Pulse 87  Temp(Src) 97.7 F (36.5 C) (Oral)  Wt 200 lb (90.719 kg)  SpO2 95% Wt Readings from Last 3 Encounters:  09/29/15 200 lb (90.719 kg)  09/25/15 212 lb 1.3 oz (96.199 kg)  09/09/15 211 lb 9.6 oz (95.981 kg)     Lab Results  Component Value Date   WBC 7.5 09/29/2015   HGB 13.0 09/29/2015   HCT 39.9 09/29/2015   PLT 291.0 09/29/2015   GLUCOSE 113* 09/29/2015   CHOL 221* 09/29/2015   TRIG 322.0* 09/29/2015   HDL 35.50* 09/29/2015   LDLDIRECT 125.0 09/29/2015   LDLCALC 62 07/16/2012   ALT 16 09/29/2015   AST 19 09/29/2015   NA 138 09/29/2015   K 4.6 09/29/2015   CL 103 09/29/2015   CREATININE 1.08 09/29/2015   BUN 23 09/29/2015   CO2 29 09/29/2015   TSH 1.54 09/10/2015   INR 1.15 04/20/2013   HGBA1C 5.5 06/24/2011    Ct Angio Chest W/cm &/or Wo Cm  06/23/2015  CLINICAL DATA:  Abdominal aortic aneurysm. EXAM: CT ANGIOGRAPHY CHEST, ABDOMEN AND PELVIS TECHNIQUE: Multidetector CT imaging through the chest, abdomen and pelvis was performed using the standard protocol during bolus administration of intravenous contrast. Multiplanar reconstructed images and MIPs were obtained and reviewed to evaluate the vascular anatomy. CONTRAST:  75 cc Isovue 370 COMPARISON:  06/11/2014 FINDINGS: CTA CHEST FINDINGS Aneurysmal dilatation of the thoracic aorta is present. Maximal diameter at the sinus of Valsalva, sino-tubular junction, and ascending aorta are 4.8 cm, 4.1 cm, and 5.5 cm. Aortic diameter in the distal arch is 3.9 cm. In the proximal descending aorta, it measures 4.3 cm. There is irregular plaque along the distal arch and descending thoracic aorta. There is also a penetrating ulcer with a saccular aneurysm extending inferiorly from the distal  arch. The neck of the penetrating ulcer is 2.7 cm. The small aneurysm extends 11 mm beyond the expected lumen of the vessel. Tiny chronic appearing dissection flaps are seen at the proximal and distal extent of the penetrating ulcer. Atherosclerotic changes of the great vessels are noted without significant focal narrowing. Vertebral arteries are also patent within the confines of the examination. No central pulmonary thromboemboli are identified. Three vessel coronary artery calcification. No abnormal mediastinal adenopathy. No axillary adenopathy. Thyroid gland is unremarkable. Prominent asymmetric density in the right breast measures 3.3 x 2.1 cm Dependent atelectasis bilaterally. Mild centrilobular emphysema is present. There is a linear area with central nodularity on image 28 at the left apex. This extends towards the central left upper lobe with cavitary areas and calcification. Other calcified granulomata in the left upper  lobe are noted. No acute bony deformity. Review of the MIP images confirms the above findings. CTA ABDOMEN AND PELVIS FINDINGS Aneurysmal dilatation of the abdominal aorta is present. At the diaphragmatic hiatus, maximal diameter is 4.3 cm. My direct measurements of the prior study result in in a previous measurement of 3.7 cm. Aneurysmal dilatation of the juxtarenal aorta measures 4.6 cm in transverse diameter and 4.2 cm in AP diameter. Previous maximal diameter was 4.0 cm. There is chronic smooth mural thrombus within the aneurysmal segment. Narrowing at the origin of the celiac axis is present. 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 Branch vessels are patent. SMA is patent. Calcified plaque at the origin of the right renal artery is noted. Significant narrowing cannot be excluded. Mild atherosclerotic changes at the origin of the left renal artery. IMA origin has been sacrificed. Branch vessels reconstitute and are diminutive. Graft anastomosis to the  distal right common iliac artery is patent. Mild narrowing at the origin of the right external iliac artery. Atherosclerotic calcifications are present without significant focal narrowing. Diffuse atherosclerotic changes of the right internal iliac artery, which is mildly ectatic. Left graft to common iliac artery anastomosis is patent. Diffuse atherosclerotic calcifications of the left iliac arterial vasculature without significant focal narrowing. Left internal iliac artery is mildly ectatic. Gallstones are suspected. Pneumobilia is present suggesting previous biliary instrumentation. This is stable. No liver lesion. Spleen, pancreas, adrenal glands are within normal limits Several benign appearing cysts are present in the kidneys bilaterally. There are calculi within the right kidney. The largest is in the lower pole measuring 6 mm. Ventral abdominal hernia is not significantly changed with a wide neck. Transverse colon extends into the hernia sac. Bladder, uterus, and adnexa are within normal limits for age No free-fluid.  No abnormal retroperitoneal adenopathy. L4 compression deformity is stable. Review of the MIP images confirms the above findings. IMPRESSION: Aneurysmal dilatation of the thoracic aorta measures 5.5 cm in the ascending aorta. Recommend semi-annual imaging followup by CTA or MRA and referral to cardiothoracic surgery if not already obtained. This recommendation follows 2010 ACCF/AHA/AATS/ACR/ASA/SCA/SCAI/SIR/STS/SVM Guidelines for the Diagnosis and Management of Patients With Thoracic Aortic Disease. Circulation. 2010; 121: [id number] There is a wide neck penetrating atherosclerotic ulcer in the distal aortic arch extending inferiorly that is relatively prominent as described. Asymmetric density in the right breast as described. Correlation with mammography is recommended Pulmonary parenchymal changes at the left apex are most consistent with chronic granulomatous disease. Underlying  malignancy is not entirely excluded without prior imaging for comparison. Initial three-month follow-up and subsequent annual follow-up is recommended. Juxtarenal aneurysmal dilatation of the abdominal aorta has increased from 4.0 cm to 4.6 cm. Recommend followup by abdomen and pelvis CTA in 6 months, and vascular surgery referral/consultation if not already obtained. This recommendation follows ACR consensus guidelines: White Paper of the ACR Incidental Findings Committee II on Vascular Findings. J Am Coll Radiol 2013; 10:789-794. Significant narrowing at the origin of the right renal artery cannot be excluded. Renal artery Doppler study may be helpful. Aorto bi-iliac graft repair is patent. Possible gallstones.  Ultrasound is recommended. Right nephrolithiasis. Ventral hernia. Electronically Signed   By: Marybelle Killings M.D.   On: 06/23/2015 13:06   Ct Angio Abd/pel W/ And/or W/o  06/23/2015  CLINICAL DATA:  Abdominal aortic aneurysm. EXAM: CT ANGIOGRAPHY CHEST, ABDOMEN AND PELVIS TECHNIQUE: Multidetector CT imaging through the chest, abdomen and pelvis was performed using the standard protocol during bolus administration of intravenous  contrast. Multiplanar reconstructed images and MIPs were obtained and reviewed to evaluate the vascular anatomy. CONTRAST:  75 cc Isovue 370 COMPARISON:  06/11/2014 FINDINGS: CTA CHEST FINDINGS Aneurysmal dilatation of the thoracic aorta is present. Maximal diameter at the sinus of Valsalva, sino-tubular junction, and ascending aorta are 4.8 cm, 4.1 cm, and 5.5 cm. Aortic diameter in the distal arch is 3.9 cm. In the proximal descending aorta, it measures 4.3 cm. There is irregular plaque along the distal arch and descending thoracic aorta. There is also a penetrating ulcer with a saccular aneurysm extending inferiorly from the distal arch. The neck of the penetrating ulcer is 2.7 cm. The small aneurysm extends 11 mm beyond the expected lumen of the vessel. Tiny chronic appearing  dissection flaps are seen at the proximal and distal extent of the penetrating ulcer. Atherosclerotic changes of the great vessels are noted without significant focal narrowing. Vertebral arteries are also patent within the confines of the examination. No central pulmonary thromboemboli are identified. Three vessel coronary artery calcification. No abnormal mediastinal adenopathy. No axillary adenopathy. Thyroid gland is unremarkable. Prominent asymmetric density in the right breast measures 3.3 x 2.1 cm Dependent atelectasis bilaterally. Mild centrilobular emphysema is present. There is a linear area with central nodularity on image 28 at the left apex. This extends towards the central left upper lobe with cavitary areas and calcification. Other calcified granulomata in the left upper lobe are noted. No acute bony deformity. Review of the MIP images confirms the above findings. CTA ABDOMEN AND PELVIS FINDINGS Aneurysmal dilatation of the abdominal aorta is present. At the diaphragmatic hiatus, maximal diameter is 4.3 cm. My direct measurements of the prior study result in in a previous measurement of 3.7 cm. Aneurysmal dilatation of the juxtarenal aorta measures 4.6 cm in transverse diameter and 4.2 cm in AP diameter. Previous maximal diameter was 4.0 cm. There is chronic smooth mural thrombus within the aneurysmal segment. Narrowing at the origin of the celiac axis is present. 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 Branch vessels are patent. SMA is patent. Calcified plaque at the origin of the right renal artery is noted. Significant narrowing cannot be excluded. Mild atherosclerotic changes at the origin of the left renal artery. IMA origin has been sacrificed. Branch vessels reconstitute and are diminutive. Graft anastomosis to the distal right common iliac artery is patent. Mild narrowing at the origin of the right external iliac artery. Atherosclerotic calcifications are  present without significant focal narrowing. Diffuse atherosclerotic changes of the right internal iliac artery, which is mildly ectatic. Left graft to common iliac artery anastomosis is patent. Diffuse atherosclerotic calcifications of the left iliac arterial vasculature without significant focal narrowing. Left internal iliac artery is mildly ectatic. Gallstones are suspected. Pneumobilia is present suggesting previous biliary instrumentation. This is stable. No liver lesion. Spleen, pancreas, adrenal glands are within normal limits Several benign appearing cysts are present in the kidneys bilaterally. There are calculi within the right kidney. The largest is in the lower pole measuring 6 mm. Ventral abdominal hernia is not significantly changed with a wide neck. Transverse colon extends into the hernia sac. Bladder, uterus, and adnexa are within normal limits for age No free-fluid.  No abnormal retroperitoneal adenopathy. L4 compression deformity is stable. Review of the MIP images confirms the above findings. IMPRESSION: Aneurysmal dilatation of the thoracic aorta measures 5.5 cm in the ascending aorta. Recommend semi-annual imaging followup by CTA or MRA and referral to cardiothoracic surgery if not already obtained.  This recommendation follows 2010 ACCF/AHA/AATS/ACR/ASA/SCA/SCAI/SIR/STS/SVM Guidelines for the Diagnosis and Management of Patients With Thoracic Aortic Disease. Circulation. 2010; 121: [id number] There is a wide neck penetrating atherosclerotic ulcer in the distal aortic arch extending inferiorly that is relatively prominent as described. Asymmetric density in the right breast as described. Correlation with mammography is recommended Pulmonary parenchymal changes at the left apex are most consistent with chronic granulomatous disease. Underlying malignancy is not entirely excluded without prior imaging for comparison. Initial three-month follow-up and subsequent annual follow-up is recommended.  Juxtarenal aneurysmal dilatation of the abdominal aorta has increased from 4.0 cm to 4.6 cm. Recommend followup by abdomen and pelvis CTA in 6 months, and vascular surgery referral/consultation if not already obtained. This recommendation follows ACR consensus guidelines: White Paper of the ACR Incidental Findings Committee II on Vascular Findings. J Am Coll Radiol 2013; 10:789-794. Significant narrowing at the origin of the right renal artery cannot be excluded. Renal artery Doppler study may be helpful. Aorto bi-iliac graft repair is patent. Possible gallstones.  Ultrasound is recommended. Right nephrolithiasis. Ventral hernia. Electronically Signed   By: Marybelle Killings M.D.   On: 06/23/2015 13:06     Assessment & Plan:  Plan I have discontinued Ms. Heinkel's NONFORMULARY OR COMPOUNDED ITEM, carboxymethylcellulose, bisacodyl, fluorouracil, and Melatonin. I am also having her maintain her losartan, acetaminophen, magnesium hydroxide, amLODipine, docusate sodium, and Heparin (Porcine) in NaCl.  No orders of the defined types were placed in this encounter.    Problem List Items Addressed This Visit      Unprioritized   Hyperlipidemia   Relevant Orders   Lipid panel (Completed)   LDL cholesterol, direct (Completed)    Other Visit Diagnoses    Kidney stones    -  Primary    Relevant Orders    Ambulatory referral to Urology    Traumatic subdural hemorrhage with loss of consciousness, initial encounter [facility])        Relevant Orders    Ambulatory referral to Neurosurgery    Iron deficiency anemia due to chronic blood loss        Relevant Orders    Comprehensive metabolic panel (Completed)    CBC with Differential/Platelet (Completed)    Elevated serum creatinine        Relevant Orders    Comprehensive metabolic panel (Completed)       Follow-up: Return in about 3 months (around 12/30/2015), or if symptoms worsen or fail to improve.  Ann Held, DO

## 2015-09-29 NOTE — Patient Instructions (Signed)
Traumatic Brain Injury Traumatic brain injury (TBI) is an injury to your brain from a blow to your head (closed injury) or an object penetrating your skull and entering your brain (open injury). The severity of TBI varies significantly from one person to the next. Some TBIs cause you to pass out (lose consciousness) immediately and for a long period of time. Other TBIs do not cause any loss of consciousness.  Symptoms of any type of TBI can be long lasting (chronic). TBI can interfere with memory and speech. TBI can also cause chronic symptoms like headache or dizziness. CAUSES  TBI is caused by a closed or open injury. RISK FACTORS You may be at higher risk for TBI if you:  Are 75 or older.  Are a man.  Are in a car accident.  Play a contact sport, especially football, hockey, or soccer.  Do not wear protective gear while playing sports.  Are in the [employer]  Are a victim of violence.  Abuse drugs or alcohol.  Have had a previous TBI. SIGNS AND SYMPTOMS  Signs and symptoms of TBI may occur right away or not until days, weeks, or months after the injury. They may last for days, weeks, months, or years. Symptoms may include:  Loss of consciousness.  Headache.  Confusion.  Fatigue.  Changes in sleep.  Dizziness.  Mood or personality changes.  Memory problems.  Nausea or vomiting or both.  Seizures.  Clumsiness.  Slurred speech.  Depression and anxiety.  Anger.  Inability to control emotions or actions (impulse control).  Loss of or dulling of your senses, such as hearing, vision, and touch. This can include:  Blurred vision.  Ringing in your ears. DIAGNOSIS  TBI may be diagnosed by a medical history and physical exam. Your health care provider will also do a neurologic exam to check your:  Reflexes.  Sensations.  Alertness.  Memory.  Vision.  Hearing.  Coordination. Your health care provider will also do tests to diagnose the extent of  your TBI, such as a CT scan of your brain and skull. One way to determine the severity of your TBI is with a scoring system called the Glasgow Coma Scale (GCS). It measures eye opening, motor response, and verbal response. The higher the score, the milder the TBI.  Your TBI may be described as mild, moderate, or severe:   Mild TBI (concussion).  Symptoms of mild TBI usually go away on their own. This can take weeks or months, depending on the type of concussion.  Your GCS will be 13-15.  Your brain CT scan will be normal.  You may or may not have a short hospital stay.  Moderate TBI.  Your GCS will be 9-12.  Your brain CT scan will be abnormal.  You will likely need a short hospital stay.  Severe TBI.  Your GCS will be 3-8.  Your brain CT scan will be abnormal.  You may need a long stay in the hospital. TREATMENT Emergency treatment of TBI may involve measures to maintain a clear airway and stable blood pressure. Brain surgery may be needed to:  Remove a blood clot.  Repair bleeding.  Remove an object that has penetrated the brain, such as a skull fragment or a bullet. Other treatments depend on your chronic signs and symptoms. These treatments include the following types of therapy:  Physical.  Occupational.  Speech and language.  Mental health.  Social support. HOME CARE INSTRUCTIONS  Carefully follow all your health care  provider's instructions.  Work closely with all your therapists, if necessary.   Take medicines only as directed by your health care provider. Do not take aspirin or other anti-inflammatory medicines such as ibuprofen or naproxen unless approved by your health care provider.  Do not abuse illegal drugs.   Limit alcohol intake to no more than 1 drink per day for nonpregnant women and 2 drinks per day for men. One drink equals 12 ounces of beer, 5 ounces of wine, or 1 ounces of hard liquor.  Avoid any situation where there is potential  for another head injury, such as football, hockey, soccer, basketball, martial arts, downhill snow sports, and horseback riding. Do not do these activities until your health care provider approves.   Rest. Rest helps the brain to heal. Make sure you:   Get plenty of sleep at night. Avoid staying up late at night.   Keep the same bedtime hours on weekends and weekdays.   Rest during the day. Take daytime naps or rest breaks when you feel tired.  Avoid excessive visual stimulation while recovering from a TBI. This includes work on the computer, watching TV, and reading.  Try to avoid activities that cause physical or mental stress. Stay home from work or school as directed by your health care provider.  Make lists to plan your day and help your memory.  Do not drive, ride a bicycle, or operate heavy machinery until your health care provider approves.  Seek support from friends and family.  Keep all follow-up visits as directed by your health care provider. This is important.  Watch your symptoms and tell others to do the same. Complications sometimes occur after a TBI. PREVENTION   Wear a helmet while biking, skiing, skateboarding, skating, or doing similar activities. Wear your seatbelt while driving.  Do not abuse alcohol or drugs.  Do not drink and drive.  Prevent falls at home by:  Removing clutter and tripping hazards, including loose rugs, from floors and stairways.  Using grab bars in bathrooms and handrails by stairs.   Placing nonslip mats on floors and in bathtubs.   Improving lighting in dim areas.  SEEK MEDICAL CARE IF: Seek medical care if you have any of the following symptoms for more than two weeks after your injury:  Chronic headaches.   Dizziness or balance problems.   Nausea.   Vision problems.   Increased sensitivity to noise or light.   Depression or mood swings.   Anxiety or irritability.   Memory problems.   Difficulty  concentrating or paying attention.   Sleep problems.   Feeling tired all the time.  SEEK IMMEDIATE MEDICAL CARE IF:  You have confusion or unusual drowsiness.  It is difficult to wake you up.   You have nausea or persistent, forceful vomiting.   You feel like you are moving when you are not (vertigo). Your eyes may move rapidly back and forth.  You have convulsions or faint.   You have severe, persistent headaches that are not relieved by medicine.   You cannot use your arms or legs normally.   One of your pupils is larger than the other.   You have clear or bloody discharge from your nose or ears.  Your problems are getting worse, not better.    This information is not intended to replace advice given to you by your health care provider. Make sure you discuss any questions you have with your health care provider.   Document Released:  03/11/2002 Document Revised: 04/11/2014 Document Reviewed: 07/04/2013 Elsevier Interactive Patient Education [employer].

## 2015-09-29 NOTE — Progress Notes (Signed)
Pre visit review using our clinic review tool, if applicable. No additional management support is needed unless otherwise documented below in the visit note. 

## 2015-10-01 ENCOUNTER — Telehealth: Payer: Self-pay

## 2015-10-01 NOTE — Telephone Encounter (Signed)
Rosalita Chessman Chase, DO  Rudene Anda, RN           Don't worry about last message ---  D/c summary is in care everywhere--- need more info from neurosurgeon--- (see care everywhere) We have referral in for f/u for ns here but need to know how long to con't heparin     Called Dr. Marygrace Drought office at [phone] and left a message with his assistance Brooke.  Awaiting call back.

## 2015-10-07 ENCOUNTER — Encounter: Payer: Self-pay | Admitting: Family Medicine

## 2015-10-08 ENCOUNTER — Ambulatory Visit: Payer: Medicare Other | Admitting: Family Medicine

## 2015-10-12 NOTE — Telephone Encounter (Signed)
Per Anderson Malta, office will fax over a copy of medical records and last CT scan.

## 2015-10-12 NOTE — Telephone Encounter (Signed)
They would be unable to say, patient will CT to determine if their is still any bleeding

## 2015-10-14 ENCOUNTER — Other Ambulatory Visit: Payer: Self-pay | Admitting: Urology

## 2015-10-15 NOTE — Telephone Encounter (Signed)
Referral was sent to Neurosurgery on 09/29/2015, can we get an update for Pt?

## 2015-10-15 NOTE — Telephone Encounter (Signed)
Records were faxed to neurosurgeon

## 2015-10-16 ENCOUNTER — Other Ambulatory Visit: Payer: Self-pay | Admitting: Internal Medicine

## 2015-10-16 ENCOUNTER — Ambulatory Visit: Payer: Medicare Other | Admitting: Family Medicine

## 2015-10-20 MED ORDER — HEPARIN (PORCINE) IN NACL 15000-0.9 UT/500ML-% IV SOLN: 1.0000 mL | Freq: Two times a day (BID) | INTRAVENOUS | Status: DC

## 2015-10-20 NOTE — Telephone Encounter (Signed)
Spoke with Delsa Sale (POC), she has left message with [facility] to call back re: appt status and she is waiting on their call. Please advise re: pt's request?

## 2015-10-20 NOTE — Addendum Note (Signed)
Addended by: Ewing Schlein on: 10/20/2015 02:48 PM   Modules accepted: Orders, Medications

## 2015-10-20 NOTE — Telephone Encounter (Signed)
Please review dose and advised if what is in the chart is appropriate for this patient.

## 2015-10-20 NOTE — Telephone Encounter (Signed)
Please review and advise on referral.

## 2015-10-20 NOTE — Telephone Encounter (Signed)
Ok to renew lovenox ----they may have it downstairs Any news on neurosurgery consult

## 2015-10-22 MED ORDER — AMLODIPINE BESYLATE 5 MG PO TABS: 5.0000 mg | ORAL_TABLET | Freq: Every day | ORAL | Status: DC

## 2015-10-22 NOTE — Addendum Note (Signed)
Addended byDamita Dunnings D on: 10/22/2015 08:48 AM   Modules accepted: Orders

## 2015-10-24 ENCOUNTER — Other Ambulatory Visit: Payer: Self-pay | Admitting: Adult Health

## 2015-10-26 ENCOUNTER — Telehealth: Payer: Self-pay | Admitting: Family Medicine

## 2015-10-26 MED ORDER — HEPARIN (PORCINE) IN NACL 15000-0.9 UT/500ML-% IV SOLN: 1.0000 mL | Freq: Two times a day (BID) | INTRAVENOUS | 0 refills | Status: DC

## 2015-10-26 NOTE — Telephone Encounter (Signed)
Rx has been re-faxed.   KP

## 2015-10-26 NOTE — Addendum Note (Signed)
Addended by: Ewing Schlein on: 10/26/2015 03:09 PM   Modules accepted: Orders

## 2015-10-26 NOTE — Telephone Encounter (Signed)
°  Relationship to patient: Self Can be reached: [phone] Pharmacy:  [facility], [place] - [address] [phone] (Phone) [phone] (Fax)     Reason for call: Needs Heparin call in to [facility] so that she can pick it up. [facility] not [facility] it and patient has one dose left

## 2015-10-28 ENCOUNTER — Other Ambulatory Visit: Payer: Self-pay | Admitting: Internal Medicine

## 2015-10-28 ENCOUNTER — Encounter (HOSPITAL_COMMUNITY): Payer: Self-pay

## 2015-10-29 ENCOUNTER — Telehealth: Payer: Self-pay | Admitting: Family Medicine

## 2015-10-29 NOTE — Telephone Encounter (Signed)
Verbal given.     KP 

## 2015-10-29 NOTE — Telephone Encounter (Signed)
Caller name:[facility] Relationship to patient: Can be [id number] Pharmacy:  Reason for call:Needs verbal order to continue PT

## 2015-10-30 ENCOUNTER — Other Ambulatory Visit (HOSPITAL_COMMUNITY): Payer: Self-pay | Admitting: Neurological Surgery

## 2015-10-30 DIAGNOSIS — S065X9A Traumatic subdural hemorrhage with loss of consciousness of unspecified duration, initial encounter: Secondary | ICD-10-CM

## 2015-10-30 DIAGNOSIS — S065XAA Traumatic subdural hemorrhage with loss of consciousness status unknown, initial encounter: Secondary | ICD-10-CM

## 2015-11-02 ENCOUNTER — Telehealth: Payer: Self-pay | Admitting: *Deleted

## 2015-11-02 NOTE — Telephone Encounter (Signed)
Received order paperwork via fax from [facility].  Placed in folder for Dr. Etter Sjogren Chase.//AB/CMA

## 2015-11-02 NOTE — Patient Instructions (Addendum)
Deanna [name]  11/02/2015   Your procedure is scheduled on: 11/05/2015    Report to [facility] Main  Entrance take [facility]  elevators to 3rd floor to  [place] at   1100 AM.  Call this number if you have problems the morning of surgery [phone]   Remember: ONLY 1 PERSON MAY GO WITH YOU TO SHORT STAY TO GET  READY MORNING OF [place].  Do not eat food or drink liquids :After Midnight.     Take these medicines the morning of surgery with A SIP OF WATER: Amlodipine ( Norvasc)                                You may not have any metal on your body including hair pins and              piercings  Do not wear jewelry, make-up, lotions, powders or perfumes, deodorant             Do not wear nail polish.  Do not shave  48 hours prior to surgery.     Do not bring valuables to the hospital. [place].  Contacts, dentures or bridgework may not be worn into surgery.      Patients discharged the day of surgery will not be allowed to drive home.  Name and phone number of your driver:  Special Instructions: coughing and deep breathing exercises, leg exercises               Please read over the following fact sheets you were given: _____________________________________________________________________             [facility] - Preparing for Surgery Before surgery, you can play an important role.  Because skin is not sterile, your skin needs to be as free of germs as possible.  You can reduce the number of germs on your skin by washing with CHG (chlorahexidine gluconate) soap before surgery.  CHG is an antiseptic cleaner which kills germs and bonds with the skin to continue killing germs even after washing. Please DO NOT use if you have an allergy to CHG or antibacterial soaps.  If your skin becomes reddened/irritated stop using the CHG and inform your nurse when you arrive at Short Stay. Do not shave (including  legs and underarms) for at least 48 hours prior to the first CHG shower.  You may shave your face/neck. Please follow these instructions carefully:  1.  Shower with CHG Soap the night before surgery and the  morning of Surgery.  2.  If you choose to wash your hair, wash your hair first as usual with your  normal  shampoo.  3.  After you shampoo, rinse your hair and body thoroughly to remove the  shampoo.                           4.  Use CHG as you would any other liquid soap.  You can apply chg directly  to the skin and wash                       Gently with a scrungie or clean washcloth.  5.  Apply the CHG Soap to your body ONLY FROM THE NECK DOWN.   Do not use on face/ open                           Wound or open sores. Avoid contact with eyes, ears mouth and genitals (private parts).                       Wash face,  Genitals (private parts) with your normal soap.             6.  Wash thoroughly, paying special attention to the area where your surgery  will be performed.  7.  Thoroughly rinse your body with warm water from the neck down.  8.  DO NOT shower/wash with your normal soap after using and rinsing off  the CHG Soap.                9.  Pat yourself dry with a clean towel.            10.  Wear clean pajamas.            11.  Place clean sheets on your bed the night of your first shower and do not  sleep with pets. Day of Surgery : Do not apply any lotions/deodorants the morning of surgery.  Please wear clean clothes to the hospital/surgery center.  FAILURE TO FOLLOW THESE INSTRUCTIONS MAY RESULT IN THE CANCELLATION OF YOUR SURGERY PATIENT SIGNATURE_________________________________  NURSE SIGNATURE__________________________________  ________________________________________________________________________

## 2015-11-03 ENCOUNTER — Ambulatory Visit (HOSPITAL_COMMUNITY)
Admission: RE | Admit: 2015-11-03 | Discharge: 2015-11-03 | Disposition: A | Discharge status: Home / Self Care | Payer: Medicare Other | Source: Ambulatory Visit | Attending: Neurological Surgery | Admitting: Neurological Surgery

## 2015-11-03 ENCOUNTER — Encounter (HOSPITAL_COMMUNITY): Payer: Self-pay

## 2015-11-03 ENCOUNTER — Encounter (HOSPITAL_COMMUNITY)
Admission: RE | Admit: 2015-11-03 | Discharge: 2015-11-03 | Disposition: A | Discharge status: Home / Self Care | Payer: Medicare Other | Source: Ambulatory Visit | Attending: Urology | Admitting: Urology

## 2015-11-03 DIAGNOSIS — I6200 Nontraumatic subdural hemorrhage, unspecified: Secondary | ICD-10-CM | POA: Diagnosis present

## 2015-11-03 DIAGNOSIS — S065XAA Traumatic subdural hemorrhage with loss of consciousness status unknown, initial encounter: Secondary | ICD-10-CM

## 2015-11-03 DIAGNOSIS — S065X9A Traumatic subdural hemorrhage with loss of consciousness of unspecified duration, initial encounter: Secondary | ICD-10-CM

## 2015-11-03 DIAGNOSIS — I10 Essential (primary) hypertension: Secondary | ICD-10-CM | POA: Diagnosis not present

## 2015-11-03 HISTORY — DX: Essential (primary) hypertension: I10

## 2015-11-03 HISTORY — DX: Malignant (primary) neoplasm, unspecified: C80.1

## 2015-11-03 HISTORY — DX: Other amnesia: R41.3

## 2015-11-03 HISTORY — DX: Thoracic aortic aneurysm, without rupture, unspecified: I71.20

## 2015-11-03 HISTORY — DX: Traumatic subdural hemorrhage with loss of consciousness of unspecified duration, initial encounter: S06.5X9A

## 2015-11-03 HISTORY — DX: Thoracic aortic aneurysm, without rupture: I71.2

## 2015-11-03 HISTORY — DX: Urinary tract infection, site not specified: N39.0

## 2015-11-03 LAB — CBC
HCT: 46.7 % — ABNORMAL HIGH (ref 36.0–46.0)
HEMOGLOBIN: 15.1 g/dL — AB (ref 12.0–15.0)
MCH: 29.1 pg (ref 26.0–34.0)
MCHC: 32.3 g/dL (ref 30.0–36.0)
MCV: 90 fL (ref 78.0–100.0)
Platelets: 235 10*3/uL (ref 150–400)
RBC: 5.19 MIL/uL — AB (ref 3.87–5.11)
RDW: 14.4 % (ref 11.5–15.5)
WBC: 8.1 10*3/uL (ref 4.0–10.5)

## 2015-11-03 LAB — BASIC METABOLIC PANEL
ANION GAP: 9 (ref 5–15)
BUN: 18 mg/dL (ref 6–20)
CALCIUM: 9.9 mg/dL (ref 8.9–10.3)
CO2: 26 mmol/L (ref 22–32)
Chloride: 103 mmol/L (ref 101–111)
Creatinine, Ser: 1.02 mg/dL — ABNORMAL HIGH (ref 0.44–1.00)
GFR, EST AFRICAN AMERICAN: 57 mL/min — AB (ref >60)
GFR, EST NON AFRICAN AMERICAN: 49 mL/min — AB (ref >60)
Glucose, Bld: 108 mg/dL — ABNORMAL HIGH (ref 65–99)
Potassium: 4.4 mmol/L (ref 3.5–5.1)
Sodium: 138 mmol/L (ref 135–145)

## 2015-11-03 LAB — PROTIME-INR
INR: 1.07
PROTHROMBIN TIME: 13.9 s (ref 11.4–15.2)

## 2015-11-03 LAB — APTT: APTT: 28 s (ref 24–36)

## 2015-11-03 NOTE — Progress Notes (Signed)
BMP done 11/03/15 faxed via EPIC to Dr Jeffie Pollock.

## 2015-11-03 NOTE — Progress Notes (Addendum)
ECHo- 2015- EPIC  06/23/15- CT angio chest- EPIC LOV-06/23/15- Vascular- EP[facility]  Requested 12 lead EKG tracing from [facility] by fax with medical release form.  CT of head done 11/03/15- [facility]

## 2015-11-04 ENCOUNTER — Encounter (HOSPITAL_COMMUNITY): Payer: Self-pay

## 2015-11-04 MED ORDER — GENTAMICIN SULFATE 40 MG/ML IJ SOLN
5.0000 mg/kg | INTRAVENOUS | Status: DC
  Filled 2015-11-04: qty 11

## 2015-11-04 MED ORDER — GENTAMICIN SULFATE 40 MG/ML IJ SOLN
5.0000 mg/kg | INTRAVENOUS | Status: AC
  Administered 2015-11-05: 440 mg via INTRAVENOUS
  Filled 2015-11-04: qty 11

## 2015-11-04 NOTE — Progress Notes (Signed)
EKG done 09/05/2015- on chart from [facility].

## 2015-11-04 NOTE — Progress Notes (Signed)
Anesthesia ( Dr Landry Dyke and Dr Delma Post) made aware of recent history ( 08/2015 subdural hematoma and craniotomy with residual short term memory loss) and CT of head done 11/03/2015.  No new orders given.

## 2015-11-04 NOTE — H&P (Signed)
CC: I have kidney stones.  HPI: Deanna [name] is a 82 year-old female patient who was referred by Dr. Garnet Koyanagi, DO who is here for renal calculi.  The problem is on the right side. She first stated noticing pain on approximately 06/03/2015. This is her first kidney stone. She is not currently having flank pain, back pain, groin pain, nausea, vomiting, fever or chills.   She has never had surgical treatment for calculi in the past.   Deanna [name] presents today with a stone. She had a subdural in May that requred a craniotomy. She had a UTI and got an [place] and was found to have renal cysts and right renal stones. 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 She had no pain or obstruction but she has had resistant UTI's with 3 in the past. She has no voiding symptoms, hematuria or flank pain. She has had no prior stone treatments. She has had no other GU surgery. She had a CT in March in our system for a thoracic aneurysm and had 2 right renal stones.      ALLERGIES: Crestor TABS Keppra - Trouble Breathing, cough Zetia TABS    MEDICATIONS: Colace 1 PO Daily  Heparin Sodium 1 PO Daily  Losartan Potassium 100 mg tablet 1 tablet PO Daily  Norvasc 5 mg tablet 1 tablet PO Daily  Tylenol 1 PO Daily     GU PSH: None     PSH Notes: craniotomy 2017   NON-GU PSH: Abdominal Aortic Aneurysm Repair    GU PMH: Kidney Stone      PMH Notes: subdural hematoma   NON-GU PMH: Essential (primary) hypertension Primary biliary cirrhosis Pure hypercholesterolemia, unspecified Unspecified osteoarthritis, unspecified site    FAMILY HISTORY: Colon Cancer - Sister Death of parent - Mother, Father liver cancer - Sister Prostate Cancer - Brother   SOCIAL HISTORY: Marital Status: Widowed Current Smoking Status: Patient does not smoke anymore.  Has never drank.  Drinks 1 caffeinated drink per day. 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    REVIEW OF SYSTEMS:    GU Review Female:   Patient reports hard to postpone urination. Patient denies frequent urination, burning /pain with urination, get up at night to urinate, leakage of urine, stream starts and stops, trouble starting your stream, have to strain to urinate, and currently pregnant.  Gastrointestinal (Upper):   Patient denies nausea, vomiting, and indigestion/ heartburn.  Gastrointestinal (Lower):   Patient denies diarrhea and constipation.  Constitutional:   Patient denies fever, night sweats, weight loss, and fatigue.  Skin:   Patient denies skin rash/ lesion and itching.  Eyes:   Patient denies double vision and blurred vision.  Ears/ Nose/ Throat:   Patient denies sore throat and sinus problems.  Hematologic/Lymphatic:   Patient denies swollen glands and easy bruising.  Cardiovascular:   Patient denies leg swelling and chest pains.  Respiratory:   Patient reports shortness of breath. Patient denies cough.  Endocrine:   Patient denies excessive thirst.  Musculoskeletal:   Patient reports joint pain. Patient denies back pain.  Neurological:   short term memory loss. Patient reports headaches. Patient denies dizziness.  Psychologic:   Patient denies depression and anxiety.   VITAL SIGNS:      10/08/2015 02:38 PM  Weight 200 lb / 90.72 kg  Height 68 in / 172.72 cm  BP 130/83 mmHg  Pulse 84 /min  Temperature 97.4  F / 36 C  BMI 30.4 kg/m   MULTI-SYSTEM PHYSICAL EXAMINATION:    Constitutional: Well-nourished. No physical deformities. Normally developed. Good grooming. She has a recent right craniotomy scar.   Neck: Neck symmetrical, not swollen. Normal tracheal position.  Respiratory: No labored breathing, no use of accessory muscles.   Cardiovascular: Normal temperature, normal extremity pulses, no swelling, no varicosities. RRR without murmur   Lymphatic: No enlargement, no tenderness of supraclavicula or neck lymph nodes.  Skin: No  paleness, no jaundice, no cyanosis. No lesion, no ulcer, no rash.  Neurologic / Psychiatric: Oriented to time, oriented to place, oriented to person. No depression, no anxiety, no agitation.  Gastrointestinal: Obese abdomen. No mass, no tenderness, no rigidity.   Eyes: Normal conjunctivae. Normal eyelids.  Ears, Nose, Mouth, and Throat: Left ear no scars, no lesions, no masses. Right ear no scars, no lesions, no masses. Nose no scars, no lesions, no masses. Normal hearing. Normal lips.  Musculoskeletal: Normal gait and station of head and neck.     PAST DATA REVIEWED:  Source Of History:  Patient  Records Review:   Previous Doctor Records, Previous Hospital Records  Urine Test Review:   Urinalysis  X-Ray Review: KUB: Reviewed Films.  C.T. Chest/ Abd/Pelvis: Reviewed Films. Reviewed Report. Study from march showed a 5.32mm RLP stone and multiple bilateral cysts. The stone was only 42mm in 3/16.   Notes:                     Records from Dr. Etter Sjogren reviewed. Recent hospital records in [facility] reviewed.  CT films from [facility] in [place] are being loaded on the system for review.   PROCEDURES:         KUB - 74000  A single view of the abdomen is obtained.               Urinalysis w/Scope - 81001 Dipstick Dipstick Cont'd Micro  Specimen: Voided Bilirubin: Neg WBC/hpf: 20-40/hpf  Color: Yellow Ketones: Neg RBC/hpf: 3-10/hpf  Appearance: Clear Blood: Trace Intact Bacteria: Many (>50/hpf)  Specific Gravity: 1.025 Protein: Neg Cystals: NS (Not Seen)  pH: 6.0 Urobilinogen: 0.2 Casts: Hyaline  Glucose: Neg Nitrites: Positive Trichomonas: Not Present    Leukocyte Esterase: 1+ Mucous: Not Present      Epithelial Cells: 10-20/hpf      Yeast: Moderate (5-10/hpf)      Sperm: Not Present    ASSESSMENT:      ICD-10 Details  1 GU:   Kidney Stone - N20.0 Right, 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   2   Cystitis, Unspec w/o hematuria - N30.90 Her UA  looks infected today but she has the stents. She has had prior resistant bacteria.    PLAN:           Orders Labs Urine Culture and Sensitivity  Lab Notes:           Schedule Return Visit: 4-6 Weeks - Schedule Surgery  Return Notes: She has renal stones and stents with a possible UTI today. She needs ureteroscopy for her stone but will need to be seen by neurosurgery first.           Document Letter(s):  Created for Patient: Clinical Summary         Notes:   I am having her films from [facility] loaded for review.  Urine culture today.  I discussed the options for the stones including  ESWL and ureteroscopy and since she has the stents and the largest stone is in the RLP I think ureteroscopy would be best.  I reviewed the risks of bleeding, infection, ureteral and bladder injury, need for additional stents or secondary procedures, thrombotic events and anesthetic complications.  She needs to be evaluated by neurosurgery before we can proceed with the ureteroscopy and the daughter would like to shoot for mid August.

## 2015-11-05 ENCOUNTER — Encounter (HOSPITAL_COMMUNITY)
Admission: RE | Disposition: A | Discharge status: Home / Self Care | Payer: Self-pay | Source: Ambulatory Visit | Attending: Urology

## 2015-11-05 ENCOUNTER — Ambulatory Visit (HOSPITAL_COMMUNITY): Payer: Medicare Other | Admitting: Anesthesiology

## 2015-11-05 ENCOUNTER — Ambulatory Visit (HOSPITAL_COMMUNITY)
Admission: RE | Admit: 2015-11-05 | Discharge: 2015-11-05 | Disposition: A | Discharge status: Home / Self Care | Payer: Medicare Other | Source: Ambulatory Visit | Attending: Urology | Admitting: Urology

## 2015-11-05 ENCOUNTER — Encounter (HOSPITAL_COMMUNITY): Payer: Self-pay | Admitting: *Deleted

## 2015-11-05 DIAGNOSIS — N202 Calculus of kidney with calculus of ureter: Secondary | ICD-10-CM | POA: Insufficient documentation

## 2015-11-05 DIAGNOSIS — I10 Essential (primary) hypertension: Secondary | ICD-10-CM | POA: Insufficient documentation

## 2015-11-05 DIAGNOSIS — E669 Obesity, unspecified: Secondary | ICD-10-CM | POA: Insufficient documentation

## 2015-11-05 DIAGNOSIS — E7800 Pure hypercholesterolemia, unspecified: Secondary | ICD-10-CM | POA: Insufficient documentation

## 2015-11-05 DIAGNOSIS — Z87891 Personal history of nicotine dependence: Secondary | ICD-10-CM | POA: Insufficient documentation

## 2015-11-05 DIAGNOSIS — N3090 Cystitis, unspecified without hematuria: Secondary | ICD-10-CM | POA: Diagnosis not present

## 2015-11-05 DIAGNOSIS — G4730 Sleep apnea, unspecified: Secondary | ICD-10-CM | POA: Insufficient documentation

## 2015-11-05 DIAGNOSIS — N200 Calculus of kidney: Secondary | ICD-10-CM | POA: Diagnosis present

## 2015-11-05 DIAGNOSIS — Q638 Other specified congenital malformations of kidney: Secondary | ICD-10-CM | POA: Insufficient documentation

## 2015-11-05 DIAGNOSIS — M1990 Unspecified osteoarthritis, unspecified site: Secondary | ICD-10-CM | POA: Diagnosis not present

## 2015-11-05 DIAGNOSIS — I739 Peripheral vascular disease, unspecified: Secondary | ICD-10-CM | POA: Diagnosis not present

## 2015-11-05 DIAGNOSIS — Z6830 Body mass index (BMI) 30.0-30.9, adult: Secondary | ICD-10-CM | POA: Insufficient documentation

## 2015-11-05 DIAGNOSIS — Z79899 Other long term (current) drug therapy: Secondary | ICD-10-CM | POA: Insufficient documentation

## 2015-11-05 HISTORY — PX: CYSTOSCOPY WITH HOLMIUM LASER LITHOTRIPSY: SHX6639

## 2015-11-05 HISTORY — PX: CYSTOSCOPY WITH URETEROSCOPY, STONE BASKETRY AND STENT PLACEMENT: SHX6378

## 2015-11-05 SURGERY — CYSTOSCOPY, WITH CALCULUS MANIPULATION OR REMOVAL
Anesthesia: General | Laterality: Right

## 2015-11-05 MED ORDER — SODIUM CHLORIDE 0.9 % IJ SOLN
INTRAMUSCULAR | Status: AC
  Filled 2015-11-05: qty 10

## 2015-11-05 MED ORDER — MEPERIDINE HCL 50 MG/ML IJ SOLN: 6.2500 mg | INTRAMUSCULAR | Status: DC | PRN

## 2015-11-05 MED ORDER — CEFAZOLIN SODIUM-DEXTROSE 2-4 GM/100ML-% IV SOLN
2.0000 g | INTRAVENOUS | Status: AC
  Administered 2015-11-05: 2 g via INTRAVENOUS
  Filled 2015-11-05: qty 100

## 2015-11-05 MED ORDER — FENTANYL CITRATE (PF) 100 MCG/2ML IJ SOLN
INTRAMUSCULAR | Status: DC | PRN
  Administered 2015-11-05: 50 ug via INTRAVENOUS
  Administered 2015-11-05 (×3): 25 ug via INTRAVENOUS

## 2015-11-05 MED ORDER — EPHEDRINE SULFATE 50 MG/ML IJ SOLN
INTRAMUSCULAR | Status: AC
  Filled 2015-11-05: qty 1

## 2015-11-05 MED ORDER — OXYCODONE HCL 5 MG PO TABS: 5.0000 mg | ORAL_TABLET | ORAL | Status: DC | PRN

## 2015-11-05 MED ORDER — HYDROCODONE-ACETAMINOPHEN 5-325 MG PO TABS: 1.0000 | ORAL_TABLET | Freq: Four times a day (QID) | ORAL | 0 refills | Status: DC | PRN

## 2015-11-05 MED ORDER — EPHEDRINE SULFATE 50 MG/ML IJ SOLN
INTRAMUSCULAR | Status: DC | PRN
  Administered 2015-11-05: 10 mg via INTRAVENOUS
  Administered 2015-11-05: 5 mg via INTRAVENOUS
  Administered 2015-11-05: 10 mg via INTRAVENOUS

## 2015-11-05 MED ORDER — ACETAMINOPHEN 325 MG PO TABS: 650.0000 mg | ORAL_TABLET | ORAL | Status: DC | PRN

## 2015-11-05 MED ORDER — SODIUM CHLORIDE 0.9% FLUSH: 3.0000 mL | INTRAVENOUS | Status: DC | PRN

## 2015-11-05 MED ORDER — SODIUM CHLORIDE 0.9 % IR SOLN
Status: DC | PRN
  Administered 2015-11-05: 3000 mL

## 2015-11-05 MED ORDER — BELLADONNA ALKALOIDS-OPIUM 16.2-60 MG RE SUPP
RECTAL | Status: AC
  Filled 2015-11-05: qty 1

## 2015-11-05 MED ORDER — ONDANSETRON HCL 4 MG/2ML IJ SOLN
INTRAMUSCULAR | Status: DC | PRN
  Administered 2015-11-05: 4 mg via INTRAVENOUS

## 2015-11-05 MED ORDER — LACTATED RINGERS IV SOLN
INTRAVENOUS | Status: DC
  Administered 2015-11-05: 1000 mL via INTRAVENOUS

## 2015-11-05 MED ORDER — PROPOFOL 10 MG/ML IV BOLUS
INTRAVENOUS | Status: AC
  Filled 2015-11-05: qty 20

## 2015-11-05 MED ORDER — PROMETHAZINE HCL 25 MG/ML IJ SOLN: 6.2500 mg | INTRAMUSCULAR | Status: DC | PRN

## 2015-11-05 MED ORDER — BELLADONNA ALKALOIDS-OPIUM 16.2-60 MG RE SUPP
RECTAL | Status: DC | PRN
  Administered 2015-11-05: 1 via RECTAL

## 2015-11-05 MED ORDER — ACETAMINOPHEN 650 MG RE SUPP
650.0000 mg | RECTAL | Status: DC | PRN
  Filled 2015-11-05: qty 1

## 2015-11-05 MED ORDER — PROPOFOL 10 MG/ML IV BOLUS
INTRAVENOUS | Status: DC | PRN
  Administered 2015-11-05: 200 mg via INTRAVENOUS

## 2015-11-05 MED ORDER — CEFAZOLIN SODIUM-DEXTROSE 2-4 GM/100ML-% IV SOLN
INTRAVENOUS | Status: AC
  Filled 2015-11-05: qty 100

## 2015-11-05 MED ORDER — PHENYLEPHRINE HCL 10 MG/ML IJ SOLN
INTRAMUSCULAR | Status: DC | PRN
  Administered 2015-11-05 (×4): 40 ug via INTRAVENOUS
  Administered 2015-11-05: 80 ug via INTRAVENOUS

## 2015-11-05 MED ORDER — SODIUM CHLORIDE 0.9 % IV SOLN
250.0000 mL | INTRAVENOUS | Status: DC | PRN
Start: 2015-11-05 — End: 2015-11-05

## 2015-11-05 MED ORDER — FENTANYL CITRATE (PF) 100 MCG/2ML IJ SOLN: 25.0000 ug | INTRAMUSCULAR | Status: DC | PRN

## 2015-11-05 MED ORDER — LIDOCAINE HCL (CARDIAC) 20 MG/ML IV SOLN
INTRAVENOUS | Status: DC | PRN
  Administered 2015-11-05: 100 mg via INTRAVENOUS

## 2015-11-05 MED ORDER — FENTANYL CITRATE (PF) 100 MCG/2ML IJ SOLN
INTRAMUSCULAR | Status: AC
  Filled 2015-11-05: qty 2

## 2015-11-05 MED ORDER — MIDAZOLAM HCL 2 MG/2ML IJ SOLN: 0.5000 mg | Freq: Once | INTRAMUSCULAR | Status: DC | PRN

## 2015-11-05 MED ORDER — LIDOCAINE HCL (CARDIAC) 20 MG/ML IV SOLN
INTRAVENOUS | Status: AC
  Filled 2015-11-05: qty 5

## 2015-11-05 MED ORDER — PHENAZOPYRIDINE HCL 200 MG PO TABS: 200.0000 mg | ORAL_TABLET | Freq: Three times a day (TID) | ORAL | 0 refills | Status: DC | PRN

## 2015-11-05 MED ORDER — SODIUM CHLORIDE 0.9% FLUSH: 3.0000 mL | Freq: Two times a day (BID) | INTRAVENOUS | Status: DC

## 2015-11-05 SURGICAL SUPPLY — 16 items
BAG URO CATCHER STRL LF (MISCELLANEOUS) ×3 IMPLANT
BASKET ZERO TIP NITINOL 2.4FR (BASKET) ×2 IMPLANT
BSKT STON RTRVL ZERO TP 2.4FR (BASKET) ×1
CATH URET 5FR 28IN OPEN ENDED (CATHETERS) IMPLANT
CLOTH BEACON ORANGE TIMEOUT ST (SAFETY) ×3 IMPLANT
EXTRACTOR STONE NITINOL NGAGE (UROLOGICAL SUPPLIES) ×3 IMPLANT
FIBER LASER TRAC TIP (UROLOGICAL SUPPLIES) ×2 IMPLANT
GLOVE SURG SS PI 8.0 STRL IVOR (GLOVE) ×3 IMPLANT
GOWN STRL REUS W/TWL XL LVL3 (GOWN DISPOSABLE) ×3 IMPLANT
GUIDEWIRE STR DUAL SENSOR (WIRE) ×5 IMPLANT
MANIFOLD NEPTUNE II (INSTRUMENTS) ×3 IMPLANT
PACK CYSTO (CUSTOM PROCEDURE TRAY) ×3 IMPLANT
SHEATH ACCESS URETERAL 38CM (SHEATH) ×2 IMPLANT
STENT URET 6FRX24 CONTOUR (STENTS) ×2 IMPLANT
TUBING CONNECTING 10 (TUBING) ×2 IMPLANT
TUBING CONNECTING 10' (TUBING) ×1

## 2015-11-05 NOTE — Anesthesia Procedure Notes (Addendum)
Procedure Name: LMA Insertion Date/Time: 11/05/2015 1:04 PM Performed by: Maxwell Caul Pre-anesthesia Checklist: Suction available, Patient being monitored, Patient identified and Emergency Drugs available Patient Re-evaluated:Patient Re-evaluated prior to inductionOxygen Delivery Method: Circle system utilized Preoxygenation: Pre-oxygenation with 100% oxygen Intubation Type: IV induction Ventilation: Mask ventilation without difficulty LMA: LMA inserted LMA Size: 4.0 Number of attempts: 1 Placement Confirmation: ETT inserted through vocal cords under direct vision,  positive ETCO2 and breath sounds checked- equal and bilateral Tube secured with: Tape Dental Injury: Teeth and Oropharynx as per pre-operative assessment  Comments: LMA placed by SRNA.

## 2015-11-05 NOTE — Anesthesia Preprocedure Evaluation (Signed)
Anesthesia Evaluation  Patient identified by MRN, date of birth, ID band Patient awake    Reviewed: Allergy & Precautions, NPO status , Patient's Chart, lab work & pertinent test results  History of Anesthesia Complications Negative for: history of anesthetic complications  Airway Mallampati: II  TM Distance: >3 FB Neck ROM: Full    Dental  (+) Upper Dentures, Lower Dentures, Dental Advisory Given   Pulmonary sleep apnea (mild) , former smoker (quit 2011),    breath sounds clear to auscultation       Cardiovascular hypertension, Pt. on medications + Peripheral Vascular Disease (AAA 2011)   Rhythm:Regular Rate:Normal  '15 ECHO: EF 60-65%, mild AI   Neuro/Psych H/o SDH: s/p crani Short term memory loss    GI/Hepatic GERD  ,(+) Cirrhosis       ,   Endo/Other  negative endocrine ROS  Renal/GU negative Renal ROS     Musculoskeletal  (+) Arthritis , Osteoarthritis,    Abdominal (+) + obese,   Peds  Hematology negative hematology ROS (+)   Anesthesia Other Findings   Reproductive/Obstetrics                             Anesthesia Physical Anesthesia Plan  ASA: III  Anesthesia Plan: General   Post-op Pain Management:    Induction: Intravenous  Airway Management Planned: LMA  Additional Equipment:   Intra-op Plan:   Post-operative Plan:   Informed Consent: I have reviewed the patients History and Physical, chart, labs and discussed the procedure including the risks, benefits and alternatives for the proposed anesthesia with the patient or authorized representative who has indicated his/her understanding and acceptance.   Dental advisory given and Consent reviewed with POA  Plan Discussed with: CRNA and Surgeon  Anesthesia Plan Comments: (Plan routine monitors, GA- LMA OK Discussed with pt, daughter, including peri-op suspension of DNR)        Anesthesia Quick  Evaluation

## 2015-11-05 NOTE — Interval H&P Note (Signed)
History and Physical Interval Note:  11/05/2015 12:49 PM  Deanna [name]  has presented today for surgery, with the diagnosis of RIGHT RENAL STONES  The various methods of treatment have been discussed with the patient and family. After consideration of risks, benefits and other options for treatment, the patient has consented to  Procedure(s): CYSTOSCOPY WITH URETEROSCOPY, STONE BASKETRY AND STENT EXCHANGE (Right) CYSTOSCOPY WITH HOLMIUM LASER LITHOTRIPSY (Right) as a surgical intervention .  The patient's history has been reviewed, patient examined, no change in status, stable for surgery.  I have reviewed the patient's chart and labs.  Questions were answered to the patient's satisfaction.     Gonzalo Waymire J

## 2015-11-05 NOTE — Interval H&P Note (Signed)
History and Physical Interval Note:  11/05/2015 12:48 PM  Deanna [name]  has presented today for surgery, with the diagnosis of RIGHT RENAL STONES  The various methods of treatment have been discussed with the patient and family. After consideration of risks, benefits and other options for treatment, the patient has consented to  Procedure(s): CYSTOSCOPY WITH URETEROSCOPY, STONE BASKETRY AND STENT EXCHANGE (Right) CYSTOSCOPY WITH HOLMIUM LASER LITHOTRIPSY (Right) as a surgical intervention .  The patient's history has been reviewed, patient examined, no change in status, stable for surgery.  I have reviewed the patient's chart and labs.  Questions were answered to the patient's satisfaction.     Kc Summerson J

## 2015-11-05 NOTE — Brief Op Note (Signed)
11/05/2015  2:38 PM  PATIENT:  Rhodia Albright  82 y.o. female  PRE-OPERATIVE DIAGNOSIS:  RIGHT RENAL STONES  POST-OPERATIVE DIAGNOSIS:  RIGHT RENAL STONES  PROCEDURE:  Procedure(s): CYSTOSCOPY WITH URETEROSCOPY, STONE BASKETRY AND STENT EXCHANGE (Right) CYSTOSCOPY WITH HOLMIUM LASER LITHOTRIPSY (Right)  SURGEON:  Surgeon(s) and Role:    * Irine Seal, MD - Primary  PHYSICIAN ASSISTANT:   ASSISTANTS: none   ANESTHESIA:   general  EBL:  No intake/output data recorded.  BLOOD ADMINISTERED:none  DRAINS: 6 x 24 right JJ stent   LOCAL MEDICATIONS USED:  NONE  SPECIMEN:  Source of Specimen:  renal and ureteral stones.   DISPOSITION OF SPECIMEN:  to family  COUNTS:  YES  TOURNIQUET:  * No tourniquets in log *  DICTATION: .Other Dictation: Dictation Number [id number]  PLAN OF CARE: Discharge to home after PACU  PATIENT DISPOSITION:  PACU - hemodynamically stable.   Delay start of Pharmacological VTE agent (>24hrs) due to surgical blood loss or risk of bleeding: not applicable

## 2015-11-05 NOTE — Anesthesia Postprocedure Evaluation (Signed)
Anesthesia Post Note  Patient: Kourtny Guzzetta  Procedure(s) Performed: Procedure(s) (LRB): CYSTOSCOPY WITH URETEROSCOPY, STONE BASKETRY AND STENT EXCHANGE (Right) CYSTOSCOPY WITH HOLMIUM LASER LITHOTRIPSY (Right)  Patient location during evaluation: PACU Anesthesia Type: General Level of consciousness: awake and alert, oriented and patient cooperative Pain management: pain level controlled Vital Signs Assessment: post-procedure vital signs reviewed and stable Respiratory status: spontaneous breathing, nonlabored ventilation and respiratory function stable Cardiovascular status: blood pressure returned to baseline and stable Postop Assessment: no signs of nausea or vomiting Anesthetic complications: no    Last Vitals:  Vitals:   11/05/15 1515 11/05/15 1523  BP: 110/71 101/61  Pulse: 80 77  Resp: (!) 22 16  Temp: 36.4 C 36.4 C    Last Pain:  Vitals:   11/05/15 1500  TempSrc:   PainSc: 2                  Taygen Newsome,E. Kawika Bischoff

## 2015-11-05 NOTE — Transfer of Care (Signed)
Immediate Anesthesia Transfer of Care Note  Patient: Deanna [name]  Procedure(s) Performed: Procedure(s): CYSTOSCOPY WITH URETEROSCOPY, STONE BASKETRY AND STENT EXCHANGE (Right) CYSTOSCOPY WITH HOLMIUM LASER LITHOTRIPSY (Right)  Patient Location: PACU  Anesthesia Type:General  Level of Consciousness:  sedated, patient cooperative and responds to stimulation  Airway & Oxygen Therapy:Patient Spontanous Breathing and Patient connected to face mask oxgen  Post-op Assessment:  Report given to PACU RN and Post -op Vital signs reviewed and stable  Post vital signs:  Reviewed and stable  Last Vitals:  Vitals:   11/05/15 1024  BP: (!) 115/53  Pulse: 83  Resp: 16  Temp: [id number] C    Complications: No apparent anesthesia complications

## 2015-11-05 NOTE — Discharge Instructions (Addendum)
° ° °  General Anesthesia, Adult, Care After Refer to this sheet in the next few weeks. These instructions provide you with information on caring for yourself after your procedure. Your health care provider may also give you more specific instructions. Your treatment has been planned according to current medical practices, but problems sometimes occur. Call your health care provider if you have any problems or questions after your procedure. WHAT TO EXPECT AFTER THE PROCEDURE After the procedure, it is typical to experience:  Sleepiness.  Nausea and vomiting. HOME CARE INSTRUCTIONS  For the first 24 hours after general anesthesia:  Have a responsible person with you.  Do not drive a car. If you are alone, do not take public transportation.  Do not drink alcohol.  Do not take medicine that has not been prescribed by your health care provider.  Do not sign important papers or make important decisions.  You may resume a normal diet and activities as directed by your health care provider.  Change bandages (dressings) as directed.  If you have questions or problems that seem related to general anesthesia, call the hospital and ask for the anesthetist or anesthesiologist on call. SEEK MEDICAL CARE IF:  You have nausea and vomiting that continue the day after anesthesia.  You develop a rash. SEEK IMMEDIATE MEDICAL CARE IF:   You have difficulty breathing.  You have chest pain.  You have any allergic problems.   This information is not intended to replace advice given to you by your health care provider. Make sure you discuss any questions you have with your health care provider.   Document Released: 06/27/2000 Document Revised: 04/11/2014 Document Reviewed: 07/20/2011 Elsevier Interactive Patient Education 2016 [place] CARE INSTRUCTIONS  Activity: Rest for the remainder of the day.  Do not drive or operate equipment today.  You may resume normal  activities in one to two days as instructed by your physician.   Meals: Drink plenty of liquids and eat light foods such as gelatin or soup this evening.  You may return to a normal meal plan tomorrow.  Return to Work: You may return to work in one to two days or as instructed by your physician.  Special Instructions / Symptoms: Call your physician if any of these symptoms occur:   -persistent or heavy bleeding  -bleeding which continues after first few urination  -large blood clots that are difficult to pass  -urine stream diminishes or stops completely  -fever equal to or higher than 101 degrees Farenheit.  -cloudy urine with a strong, foul odor  -severe pain  Females should always wipe from front to back after elimination.  You may feel some burning pain when you urinate.  This should disappear with time.  Applying moist heat to the lower abdomen or a hot tub bath may help relieve the pain. \  You may remove your stent on Sunday morning by pulling the attached string.  If you don't feel comfortable with that you may come to the office Monday morning.   Bring your stone fragments to the office.   Patient Signature:  ________________________________________________________  Nurse's Signature:  ________________________________________________________

## 2015-11-05 NOTE — Op Note (Signed)
Deanna [name], Deanna [name]               ACCOUNT NO.:  [id number]  MEDICAL RECORD NO.:  [id number]  LOCATION:  [facility]                         FACILITY:  [facility]  PHYSICIAN:  Marshall Cork. Jeffie Pollock, M.D.    DATE OF BIRTH:  1929-12-26  DATE OF PROCEDURE:  11/05/2015 DATE OF DISCHARGE:  11/05/2015                              OPERATIVE REPORT   PATIENT OF:  Marshall Cork. Jeffie Pollock, M.D.  PROCEDURES: 1. Cystoscopy with removal of right double-J stents. 2. Right ureteroscopic stone extraction with holmium lasertripsy and     placement of right double-J stent in the lower pole moiety. 3. Right ureteroscopic stone extraction of a stone in the right upper     pole moiety of a duplicated system.  PREOPERATIVE DIAGNOSIS:  Right renal stones.  POSTOPERATIVE DIAGNOSIS:  Right renal and ureteral stones.  SURGEON:  Marshall Cork. Jeffie Pollock, M.D.  ANESTHESIA:  General.  SPECIMEN:  Stone fragments.  DRAINS:  A 6-French 24 cm contour double-J stent with a tether in the lower pole moiety of a duplicated right collecting system.  BLOOD LOSS:  None.  COMPLICATIONS:  None.  INDICATIONS:  Deanna [name] is an 82 year old white female who was admitted to the hospital on [address] for a subdural hematoma.  During her hospital stay, she underwent placement of right ureteral stents in her duplicated collecting system for stones.  She returns now for definitive procedure.  FINDINGS OF PROCEDURE:  The patient was given Ancef and gentamicin, was taken to the operating room where general anesthetic was induced.  She was placed in a lithotomy position, was prepped with Betadine solution. She was draped in the usual sterile fashion.  Cystoscopy was performed with a 23-French scope and 30-degree lens. Examination revealed two stents coming from the right ureteral orifice with encrustation.  The left ureteral orifice was unremarkable.  There was some mucosal erythema from the stents in the trigone, but no other bladder wall lesions were  identified.  The stents were removed sequentially and initial attempts to cannulate the stents with a guidewire were unsuccessful due to internal encrustation.  The stents were then removed, and a 6.5-French semi-rigid ureteroscope was then passed per urethra.  It was able to easily traverse the ureteral orifice on the right.  I came to the bifurcation of the orifice.  The ureter to the upper pole was initially cannulated, and a 4- 5 mm stone was identified.  It was grasped with a Zero Tip basket and removed.  A ureteroscope was then reinserted, and wires were sequentially placed to the upper and lower pole collecting system.  A 38 cm 14-French digital access sheath was placed over the wire to the lower pole, and the digital flexible scope was inserted.  Several small stones were identified in the upper portion of the lower pole caliceal system.  These were removed with the basket.  In the lower portion of the collecting system was the larger stone approximately 7-8 mm that was apparent on a KUB.  This stone was engaged with the 200 micron laser fiber, set on 0.5 watts initially with an increase to 1, with an initial frequency of 5 that was increased to 50.  The stone  was eventually reduced to dust.  Of note, on original passage of the scope, additional stones were identified in the proximal ureter.  They were also engaged with the laser and broken into manageable fragments.  Initial attempts at removal with the Nitinol basket were unsuccessful.  However, subsequent to fragmentation of the lower pole stone, the access sheath and ureteroscope were removed and were replaced with a semi-rigid scope. The fragments in the mid to proximal ureter were then removed using an NGage basket with a semi rigid scope.  Final pass with the flexible scope demonstrated complete fragmentation of the renal stones with only a small amount of fine dust which was irrigated out to a large degree.   Inspection of the ureter revealed no additional significant fragments.  A guidewire was then reinserted to the kidney.  I then made a pass to the upper pole moiety with the flexible scope and inspected the calyceal system.  There were some very small stone remnants that likely flushed retrograde from the lower pole treatment or possibly were bits of crush that came off the stent, but they were not sufficiently large to retrieve; and with the ureter being widely patent after prolonged standing, they should flush out easily.  The guidewire to the upper pole was removed.  The cystoscope was then reinserted over the lower pole wire, and a 6-French 24 cm stent with tether was passed to the lower pole.  The wire was removed leaving good coil in the kidney and good coil in the bladder.  The bladder was drained.  The stent string was tied close to the meatus, trimmed and tucked vaginally in a tampon string fashion.  B and O suppository was placed.  She was taken down from lithotomy position.  Her anesthetic was reversed.  She was moved to the recovery room in stable condition.  There were no complications.     Marshall Cork. Jeffie Pollock, M.D.     JJW/MEDQ  D:  11/05/2015  T:  11/05/2015  Job:  [id number]

## 2015-11-24 ENCOUNTER — Telehealth: Payer: Self-pay | Admitting: Family Medicine

## 2015-11-24 NOTE — Telephone Encounter (Signed)
VO given.     KP

## 2015-11-24 NOTE — Telephone Encounter (Signed)
Caller name: Deanna [name] with [facility] Can be reached: [phone]  Reason for call: Requesting VO for continuation of HH PT, 1x week for 6 weeks. OK to leave VM.

## 2015-12-21 ENCOUNTER — Encounter: Payer: Self-pay | Admitting: Family Medicine

## 2015-12-21 ENCOUNTER — Telehealth: Payer: Self-pay

## 2015-12-21 ENCOUNTER — Telehealth: Payer: Self-pay | Admitting: Family Medicine

## 2015-12-21 NOTE — Telephone Encounter (Signed)
Call from [place] from [facility], and she stated the patient's daughter was out of town for 3 days and the grandson forgot to give the patient her BP med's. She said the BP this morning was 172/100 all other vitals are WNL, the only symptom is a HA. The daughter gave her the amlodipie 5 and losartan 100 mg last night and now they want to know what they should do. Please advise     KP

## 2015-12-21 NOTE — Telephone Encounter (Signed)
Caller name: Cecille Rubin Relationship to patient: Ovid Can be reached: [phone] Pharmacy:  Reason for call: Need order to continue home care for 1 more visit this week and  2 visits per week for  additional 2 weeks. Also wanted to inform that patients BP was 154/80 today

## 2015-12-21 NOTE — Telephone Encounter (Signed)
Error:[id number] ° °

## 2015-12-21 NOTE — Telephone Encounter (Signed)
Deanna [name] has been made aware and she verbalized understanding, she will give her the medication for tonight and continue to check her BP and will give [place] a call with the BP's.   KP

## 2015-12-21 NOTE — Telephone Encounter (Signed)
Her BP has come down. Please advise    KP

## 2015-12-21 NOTE — Telephone Encounter (Signed)
She takes it at night, had them last night at bedtime. Do you want her to take them again right now and none tonight?

## 2015-12-21 NOTE — Telephone Encounter (Signed)
Take meds daily as usual

## 2015-12-21 NOTE — Telephone Encounter (Signed)
That is fine 

## 2015-12-21 NOTE — Telephone Encounter (Signed)
Give her her meds--- itll take a few days to come down

## 2015-12-22 ENCOUNTER — Other Ambulatory Visit: Payer: Self-pay | Admitting: Family Medicine

## 2015-12-22 DIAGNOSIS — R059 Cough, unspecified: Secondary | ICD-10-CM

## 2015-12-22 DIAGNOSIS — R05 Cough: Secondary | ICD-10-CM

## 2015-12-23 ENCOUNTER — Ambulatory Visit (HOSPITAL_BASED_OUTPATIENT_CLINIC_OR_DEPARTMENT_OTHER)
Admission: RE | Admit: 2015-12-23 | Discharge: 2015-12-23 | Disposition: A | Discharge status: Home / Self Care | Payer: Medicare Other | Source: Ambulatory Visit | Attending: Family Medicine | Admitting: Family Medicine

## 2015-12-23 DIAGNOSIS — R918 Other nonspecific abnormal finding of lung field: Secondary | ICD-10-CM | POA: Diagnosis not present

## 2015-12-23 DIAGNOSIS — R05 Cough: Secondary | ICD-10-CM | POA: Diagnosis not present

## 2015-12-23 DIAGNOSIS — R059 Cough, unspecified: Secondary | ICD-10-CM

## 2015-12-31 ENCOUNTER — Encounter: Payer: Self-pay | Admitting: Family Medicine

## 2015-12-31 ENCOUNTER — Ambulatory Visit (INDEPENDENT_AMBULATORY_CARE_PROVIDER_SITE_OTHER): Payer: Medicare Other | Admitting: Family Medicine

## 2015-12-31 VITALS — BP 154/81 | HR 80 | Temp 98.1°F | Resp 17 | Ht 68.0 in | Wt 197.0 lb

## 2015-12-31 DIAGNOSIS — Z23 Encounter for immunization: Secondary | ICD-10-CM

## 2015-12-31 DIAGNOSIS — E785 Hyperlipidemia, unspecified: Secondary | ICD-10-CM | POA: Diagnosis not present

## 2015-12-31 DIAGNOSIS — I10 Essential (primary) hypertension: Secondary | ICD-10-CM | POA: Diagnosis not present

## 2015-12-31 LAB — POCT URINALYSIS DIPSTICK
BILIRUBIN UA: NEGATIVE
Blood, UA: NEGATIVE
Glucose, UA: NEGATIVE
KETONES UA: NEGATIVE
LEUKOCYTES UA: NEGATIVE
Nitrite, UA: NEGATIVE
PH UA: 6
Protein, UA: NEGATIVE
SPEC GRAV UA: 1.025
Urobilinogen, UA: 0.2

## 2015-12-31 LAB — COMPREHENSIVE METABOLIC PANEL
ALT: 30 U/L (ref 0–35)
AST: 33 U/L (ref 0–37)
Albumin: 3.4 g/dL — ABNORMAL LOW (ref 3.5–5.2)
Alkaline Phosphatase: 326 U/L — ABNORMAL HIGH (ref 39–117)
BILIRUBIN TOTAL: 0.6 mg/dL (ref 0.2–1.2)
BUN: 21 mg/dL (ref 6–23)
CHLORIDE: 103 meq/L (ref 96–112)
CO2: 28 meq/L (ref 19–32)
Calcium: 9.6 mg/dL (ref 8.4–10.5)
Creatinine, Ser: 0.97 mg/dL (ref 0.40–1.20)
GFR: 58.1 mL/min — ABNORMAL LOW (ref >60.00)
GLUCOSE: 97 mg/dL (ref 70–99)
POTASSIUM: 4.8 meq/L (ref 3.5–5.1)
SODIUM: 139 meq/L (ref 135–145)
TOTAL PROTEIN: 7.8 g/dL (ref 6.0–8.3)

## 2015-12-31 LAB — LIPID PANEL
Cholesterol: 232 mg/dL — ABNORMAL HIGH (ref 0–200)
HDL: 35.6 mg/dL — AB (ref >39.00)
NONHDL: 196.63
TRIGLYCERIDES: 228 mg/dL — AB (ref 0.0–149.0)
Total CHOL/HDL Ratio: 7
VLDL: 45.6 mg/dL — AB (ref 0.0–40.0)

## 2015-12-31 LAB — LDL CHOLESTEROL, DIRECT: Direct LDL: 140 mg/dL

## 2015-12-31 NOTE — Progress Notes (Signed)
Pre visit review using our clinic review tool, if applicable. No additional management support is needed unless otherwise documented below in the visit note. 

## 2015-12-31 NOTE — Progress Notes (Signed)
Patient ID: Deanna [name], female    DOB: 11-10-1929  Age: 82 y.o. MRN: [id number]    Subjective:  Subjective  HPI Aungelique Declark presents for f/u with her daughter.  No new complaints.   Review of Systems  Constitutional: Negative for appetite change, diaphoresis, fatigue and unexpected weight change.  Eyes: Negative for pain, redness and visual disturbance.  Respiratory: Negative for cough, chest tightness, shortness of breath and wheezing.   Cardiovascular: Negative for chest pain, palpitations and leg swelling.  Endocrine: Negative for cold intolerance, heat intolerance, polydipsia, polyphagia and polyuria.  Genitourinary: Negative for difficulty urinating, dysuria and frequency.  Neurological: Positive for light-headedness and headaches. Negative for dizziness and numbness.    History Past Medical History:  Diagnosis Date  . AAA (abdominal aortic aneurysm) [facility]) 2011   Dr Early repaired this  . Acute ischemic colitis ([place]) 03/06/2012  . Anemia    patient denies   . Arthritis    bilateral hands and knees  . Bleeding ulcer 1980s   H. Pylori  . Blood transfusion without reported diagnosis   . Cancer ([place])    hx of skin cancer on face   . Cataract    In the past  . Chronic urinary tract infection   . Cirrhosis, biliary ([place])    liver bx 01/2008  . Diastolic CHF ([place]) [id number]  . Epistaxis   . Fatigue   . GERD (gastroesophageal reflux disease)   . HLD (hyperlipidemia)   . HTN (hypertension)   . Hypertension   . Leg pain    bilateral  . OSA (obstructive sleep apnea)    mild on ss of 12/2008  . Pneumonia    hx of years ago   . Short-term memory loss    related to trauma of 08/2015   . Subdural hematoma ([place]) 08/2015  . Thoracic aortic aneurysm [facility])     She has a past surgical history that includes Iliac artery - femoral artery bypass graft  (2011); Cataract extraction; Breast biopsy; Colonoscopy (03/08/2012); Abdominal aortic aneurysm repair (02/01/2010); ERCP (N/A, 04/20/2013); Craniotomy; Cystoscopy with ureteroscopy, stone basketry and stent placement (Right, 11/05/2015); and Cystoscopy with holmium laser lithotripsy (Right, 11/05/2015).   Her family history includes Cancer in her brother.She reports that she quit smoking about 5 years ago. Her smoking use included Cigarettes. She quit after 52.00 years of use. She has never used smokeless tobacco. She reports that she does not drink alcohol or use drugs.  Current Outpatient Prescriptions on File Prior to Visit  Medication Sig Dispense Refill  . acetaminophen (TYLENOL) 500 MG tablet Take 500 mg by mouth every 4 (four) hours as needed for mild pain.    Marland Kitchen docusate sodium (COLACE) 100 MG capsule Take 100 mg by mouth daily.    . magnesium hydroxide (MILK OF MAGNESIA) 400 MG/5ML suspension Take 30 mLs by mouth daily as needed for mild constipation.    . Heparin, Porcine, in NaCl 15000-0.9 UT/500ML-% SOLN Inject 1 mL into the skin 2 (two) times daily. (Patient not taking: Reported on 12/31/2015) 500 mL 0  . HYDROcodone-acetaminophen (NORCO) 5-325 MG tablet Take 1 tablet by mouth every 6 (  six) hours as needed for moderate pain. (Patient not taking: Reported on 12/31/2015) 15 tablet 0   No current facility-administered medications on file prior to visit.      Objective:  Objective  Physical Exam  Constitutional: She is oriented to person, place, and time. She appears well-developed and well-nourished.  HENT:  Head: Normocephalic and atraumatic.  Eyes: Conjunctivae and EOM are normal.  Neck: Normal range of motion. Neck supple. No JVD present. Carotid bruit is not present. No thyromegaly present.  Cardiovascular: Normal rate, regular rhythm and normal heart sounds.   No murmur heard. Pulmonary/Chest: Effort normal and breath sounds normal. No respiratory distress. She has no wheezes. She has  no rales. She exhibits no tenderness.  Musculoskeletal: She exhibits no edema.  Neurological: She is alert and oriented to person, place, and time.  Psychiatric: She has a normal mood and affect. Her behavior is normal.  Nursing note and vitals reviewed.  BP (!) 154/81 (BP Location: Right Arm, Patient Position: Sitting, Cuff Size: Normal)   Pulse 80   Temp 98.1 F (36.7 C) (Oral)   Resp 17   Ht 5\' 8"  (1.727 m)   Wt 197 lb (89.4 kg)   SpO2 95%   BMI 29.95 kg/m  Wt Readings from Last 3 Encounters:  12/31/15 197 lb (89.4 kg)  11/05/15 194 lb (88 kg)  11/03/15 194 lb (88 kg)     Lab Results  Component Value Date   WBC 8.1 11/03/2015   HGB 15.1 (H) 11/03/2015   HCT 46.7 (H) 11/03/2015   PLT 235 11/03/2015   GLUCOSE 97 12/31/2015   CHOL 232 (H) 12/31/2015   TRIG 228.0 (H) 12/31/2015   HDL 35.60 (L) 12/31/2015   LDLDIRECT 140.0 12/31/2015   LDLCALC 62 07/16/2012   ALT 30 12/31/2015   AST 33 12/31/2015   NA 139 12/31/2015   K 4.8 12/31/2015   CL 103 12/31/2015   CREATININE 0.97 12/31/2015   BUN 21 12/31/2015   CO2 28 12/31/2015   TSH 1.54 09/10/2015   INR 1.07 11/03/2015   HGBA1C 5.5 06/24/2011    Dg Chest 2 View  Result Date: 12/23/2015 CLINICAL DATA:  Elevated blood pressure EXAM: CHEST  2 VIEW COMPARISON:  02/03/2015 FINDINGS: Cardiomediastinal silhouette is stable. Tortuous thoracic aorta with atherosclerotic calcifications again noted. No infiltrate or pulmonary edema. Mild elevation of the right hemidiaphragm again noted. There is left basilar linear atelectasis or scarring. IMPRESSION: No infiltrate or pulmonary edema. Mild elevation of the right hemidiaphragm again noted. There is left basilar linear atelectasis or scarring. Electronically Signed   By: Lahoma Crocker M.D.   On: 12/23/2015 10:50     Assessment & Plan:  Plan  I have discontinued Ms. Marzano's ciprofloxacin and phenazopyridine. I am also having her maintain her acetaminophen, magnesium hydroxide, docusate  sodium, Heparin (Porcine) in NaCl, HYDROcodone-acetaminophen, losartan, and amLODipine.  Meds ordered this encounter  Medications  . losartan (COZAAR) 100 MG tablet    Sig: Take 1 tablet (100 mg total) by mouth daily.    Dispense:  90 tablet    Refill:  3  . amLODipine (NORVASC) 5 MG tablet    Sig: Take 1 tablet (5 mg total) by mouth daily.    Dispense:  90 tablet    Refill:  0    Problem List Items Addressed This Visit      Unprioritized   Essential hypertension - Primary   Relevant Medications   losartan (COZAAR) 100 MG tablet  amLODipine (NORVASC) 5 MG tablet   Other Relevant Orders   Comprehensive metabolic panel (Completed)   Hyperlipidemia    On no meds Check labs      Relevant Medications   losartan (COZAAR) 100 MG tablet   amLODipine (NORVASC) 5 MG tablet    Other Visit Diagnoses    Hyperlipidemia LDL goal <100       Relevant Medications   losartan (COZAAR) 100 MG tablet   amLODipine (NORVASC) 5 MG tablet   Other Relevant Orders   Lipid panel (Completed)   POCT urinalysis dipstick (Completed)   Encounter for immunization       Relevant Orders   Comprehensive metabolic panel (Completed)   Lipid panel (Completed)   POCT urinalysis dipstick (Completed)   Flu vaccine HIGH DOSE PF (Completed)   LDL cholesterol, direct (Completed)      Follow-up: No Follow-up on file.  Ann Held, DO

## 2015-12-31 NOTE — Patient Instructions (Signed)
Hypertension Hypertension, commonly called high blood pressure, is when the force of blood pumping through your arteries is too strong. Your arteries are the blood vessels that carry blood from your heart throughout your body. A blood pressure reading consists of a higher number over a lower number, such as 110/72. The higher number (systolic) is the pressure inside your arteries when your heart pumps. The lower number (diastolic) is the pressure inside your arteries when your heart relaxes. Ideally you want your blood pressure below 120/80. Hypertension forces your heart to work harder to pump blood. Your arteries may become narrow or stiff. Having untreated or uncontrolled hypertension can cause heart attack, stroke, kidney disease, and other problems. RISK FACTORS Some risk factors for high blood pressure are controllable. Others are not.  Risk factors you cannot control include:   Race. You may be at higher risk if you are African American.  Age. Risk increases with age.  Gender. Men are at higher risk than women before age 45 years. After age 65, women are at higher risk than men. Risk factors you can control include:  Not getting enough exercise or physical activity.  Being overweight.  Getting too much fat, sugar, calories, or salt in your diet.  Drinking too much alcohol. SIGNS AND SYMPTOMS Hypertension does not usually cause signs or symptoms. Extremely high blood pressure (hypertensive crisis) may cause headache, anxiety, shortness of breath, and nosebleed. DIAGNOSIS To check if you have hypertension, your health care provider will measure your blood pressure while you are seated, with your arm held at the level of your heart. It should be measured at least twice using the same arm. Certain conditions can cause a difference in blood pressure between your right and left arms. A blood pressure reading that is higher than normal on one occasion does not mean that you need treatment. If  it is not clear whether you have high blood pressure, you may be asked to return on a different day to have your blood pressure checked again. Or, you may be asked to monitor your blood pressure at home for 1 or more weeks. TREATMENT Treating high blood pressure includes making lifestyle changes and possibly taking medicine. Living a healthy lifestyle can help lower high blood pressure. You may need to change some of your habits. Lifestyle changes may include:  Following the DASH diet. This diet is high in fruits, vegetables, and whole grains. It is low in salt, red meat, and added sugars.  Keep your sodium intake below 2,300 mg per day.  Getting at least 30-45 minutes of aerobic exercise at least 4 times per week.  Losing weight if necessary.  Not smoking.  Limiting alcoholic beverages.  Learning ways to reduce stress. Your health care provider may prescribe medicine if lifestyle changes are not enough to get your blood pressure under control, and if one of the following is true:  You are 18-59 years of age and your systolic blood pressure is above 140.  You are 60 years of age or older, and your systolic blood pressure is above 150.  Your diastolic blood pressure is above 90.  You have diabetes, and your systolic blood pressure is over 140 or your diastolic blood pressure is over 90.  You have kidney disease and your blood pressure is above 140/90.  You have heart disease and your blood pressure is above 140/90. Your personal target blood pressure may vary depending on your medical conditions, your age, and other factors. HOME CARE INSTRUCTIONS    Have your blood pressure rechecked as directed by your health care provider.   Take medicines only as directed by your health care provider. Follow the directions carefully. Blood pressure medicines must be taken as prescribed. The medicine does not work as well when you skip doses. Skipping doses also puts you at risk for  problems.  Do not smoke.   Monitor your blood pressure at home as directed by your health care provider. SEEK MEDICAL CARE IF:   You think you are having a reaction to medicines taken.  You have recurrent headaches or feel dizzy.  You have swelling in your ankles.  You have trouble with your vision. SEEK IMMEDIATE MEDICAL CARE IF:  You develop a severe headache or confusion.  You have unusual weakness, numbness, or feel faint.  You have severe chest or abdominal pain.  You vomit repeatedly.  You have trouble breathing. MAKE SURE YOU:   Understand these instructions.  Will watch your condition.  Will get help right away if you are not doing well or get worse.   This information is not intended to replace advice given to you by your health care provider. Make sure you discuss any questions you have with your health care provider.   Document Released: 03/21/2005 Document Revised: 08/05/2014 Document Reviewed: 01/11/2013 Elsevier Interactive Patient Education 2016 [employer]  

## 2016-01-02 MED ORDER — AMLODIPINE BESYLATE 5 MG PO TABS: 5.0000 mg | ORAL_TABLET | Freq: Every day | ORAL | 0 refills | Status: DC

## 2016-01-02 MED ORDER — LOSARTAN POTASSIUM 100 MG PO TABS: 100.0000 mg | ORAL_TABLET | Freq: Every day | ORAL | 3 refills | Status: DC

## 2016-01-02 NOTE — Assessment & Plan Note (Signed)
On no meds Check labs  

## 2016-01-05 ENCOUNTER — Other Ambulatory Visit: Payer: Self-pay

## 2016-01-05 MED ORDER — FENOFIBRATE 160 MG PO TABS: 160.0000 mg | ORAL_TABLET | Freq: Every day | ORAL | 2 refills | Status: DC

## 2016-01-18 ENCOUNTER — Telehealth: Payer: Self-pay | Admitting: Family Medicine

## 2016-01-18 NOTE — Telephone Encounter (Signed)
Caller name: Andriane  Relation to pt: from  [facility] long term disability Call back number:[phone] case # [id number]   Reason for call:  Long term disability form faxed to [phone] as pet [place] rep form faxed over 2x to [phone].

## 2016-01-18 NOTE — Telephone Encounter (Signed)
Have either of you received these forms?

## 2016-01-19 ENCOUNTER — Other Ambulatory Visit: Payer: Self-pay | Admitting: Family Medicine

## 2016-01-20 NOTE — Telephone Encounter (Signed)
Forms has been received from [employer], Forms are for memory impairment and behavioral symptoms. The patient has not seen Neurology but she has seen a Neurosurgeon, however I called Dr.Ditty's office and they will not complete the paperwork. They said the PCP or a Neurologist would need to complete the paperwork. I call the patient and left a VM to schedule an apt. The document is on my desk.   KP

## 2016-01-26 ENCOUNTER — Telehealth: Payer: Self-pay | Admitting: Family Medicine

## 2016-01-26 ENCOUNTER — Other Ambulatory Visit: Payer: Self-pay | Admitting: Family Medicine

## 2016-01-26 NOTE — Telephone Encounter (Signed)
Caller name: Verdis Frederickson  Relation to pt: from [place] Call back number: [phone] case # [id number] fax # [phone] Attention case #    Reason for call:  As per Verdis Frederickson (rep) stated forms were received by you and would like to know the status of cognitive history and cognitive testing forms? Please advise

## 2016-01-27 NOTE — Telephone Encounter (Signed)
Paperwork faxed.    KP 

## 2016-01-28 NOTE — Telephone Encounter (Signed)
[employer] is stating that they have not received the cognitive testing forms, only the cognitive history forms and would like to have those faxed please. Please advise.   Fax to: [phone] Attn: Case # [id number]

## 2016-02-04 NOTE — Telephone Encounter (Signed)
[place] Specialist [phone] ext: [phone]  [employer] is calling in to follow up on paperwork. He would like a call back as soon as nurse is able.   ALSO, he would like to have paperwork faxed to: [phone]

## 2016-02-05 NOTE — Telephone Encounter (Signed)
Left a message on Specialist phone to give the office a call back. LB

## 2016-02-10 NOTE — Telephone Encounter (Signed)
Deanna [name] called in returning nurse call   He would like a follow up call back as soon as possible.

## 2016-02-11 NOTE — Telephone Encounter (Signed)
Returning call.

## 2016-02-15 NOTE — Telephone Encounter (Signed)
Left a message on Specialist voicemail to give the office a call back.

## 2016-02-17 NOTE — Telephone Encounter (Signed)
Specialist returning call

## 2016-02-18 NOTE — Telephone Encounter (Signed)
Left message on [employer] Specialist vm to give the office a call back, also called pt and left message on vm in regards to the paperwork needed by Minna Merritts the [place] Specialist. LB

## 2016-02-18 NOTE — Telephone Encounter (Signed)
Spoke with Boris Sharper Specialist, he will fax a Cognitive Testing form to filled out by PCP. LB

## 2016-02-22 ENCOUNTER — Telehealth: Payer: Self-pay

## 2016-02-22 NOTE — Telephone Encounter (Signed)
Left message on Deanna [name]'s vm, number listed to call in pt's chart, left a message stating we need a call back in regards to further cognitive testing. [facility] is requesting records from PCP of cognitive testing and pt has not had this testing done before. In order for pt to get cognitive testing done, pt has to have a referral for Neurological pysc, [facility], per PCP.

## 2016-02-23 NOTE — Telephone Encounter (Signed)
Daughter returning call best # [phone]

## 2016-02-24 NOTE — Telephone Encounter (Addendum)
Notified pt's daughter, Levada Dy and she is agreeable to referral. Referral pended. Please review and advise if any further information and what diagnosis for referral?  Should we forward copy of disability form to neurology with referral for them to complete?

## 2016-02-24 NOTE — Addendum Note (Signed)
Addended by: Kelle Darting A on: 02/24/2016 01:46 PM   Modules accepted: Orders

## 2016-02-26 ENCOUNTER — Other Ambulatory Visit: Payer: Self-pay | Admitting: Family Medicine

## 2016-02-26 DIAGNOSIS — S065X9D Traumatic subdural hemorrhage with loss of consciousness of unspecified duration, subsequent encounter: Secondary | ICD-10-CM

## 2016-03-01 NOTE — Telephone Encounter (Signed)
Left a message on Rep Maria's vm from [place], in regards to cognitive test forms being filled. Per provider the pt has to be seen by Neurology and then once we get results of cognitive testing, then forms can be filled out, signed, and faxed. I have already voice this to previous Rep Adrienn from [employer]. LB

## 2016-03-01 NOTE — Telephone Encounter (Signed)
Caller name: Verdis Frederickson  Relation to pt: [facility]  Call back number: [phone]    Reason for call:  Please reference case # [id number] checking on the cognitive forms fax to [phone], please advise when returning call please reference case # to be transferred to [facility].

## 2016-03-02 NOTE — Addendum Note (Signed)
Addended by: Kelle Darting A on: 03/02/2016 09:49 AM   Modules accepted: Orders

## 2016-03-07 NOTE — Telephone Encounter (Signed)
[facility] called to check on the status of the cognative testing forms. Advise of the last note.    Phone: [phone] ext. [phone]

## 2016-03-08 NOTE — Telephone Encounter (Signed)
Left message on the Rep Verdis Frederickson from [place], stating pt have to be seen by Neurology and her appointment for Neurology is 04/07/16 and a follow up appointment to see provider 06/30/16, then at that time cognitive testing forms can be filled once seen by Neurology. LB

## 2016-03-10 NOTE — Telephone Encounter (Signed)
[facility] is calling to follow up on the status of the cognitive testing forms. Please advise   Phone: [phone] ext [phone]  Case number: [id number]

## 2016-03-21 NOTE — Telephone Encounter (Signed)
Left message on the Rep Verdis Frederickson from [place], stating pt have to be seen by Neurology and her appointment for Neurology is 04/07/16 and a follow up appointment to see provider 06/30/16, then at that time cognitive testing forms can be filled once seen by Neurology. LB

## 2016-04-03 ENCOUNTER — Other Ambulatory Visit: Payer: Self-pay | Admitting: Family Medicine

## 2016-04-06 ENCOUNTER — Other Ambulatory Visit: Payer: Self-pay | Admitting: *Deleted

## 2016-04-06 MED ORDER — FENOFIBRATE 160 MG PO TABS: 160.0000 mg | ORAL_TABLET | Freq: Every day | ORAL | 0 refills | Status: DC

## 2016-04-06 NOTE — Telephone Encounter (Signed)
Pharmacy request a 90 day supply, because of patients insurance.  90 day sent in for fenofibrate.

## 2016-04-07 ENCOUNTER — Encounter: Payer: Medicare Other | Admitting: Psychology

## 2016-04-11 ENCOUNTER — Encounter: Payer: Self-pay | Admitting: Family Medicine

## 2016-04-12 ENCOUNTER — Ambulatory Visit (INDEPENDENT_AMBULATORY_CARE_PROVIDER_SITE_OTHER): Payer: Medicare Other | Admitting: Psychology

## 2016-04-12 DIAGNOSIS — S065X0S Traumatic subdural hemorrhage without loss of consciousness, sequela: Secondary | ICD-10-CM

## 2016-04-12 DIAGNOSIS — R413 Other amnesia: Secondary | ICD-10-CM

## 2016-04-12 NOTE — Progress Notes (Signed)
NEUROPSYCHOLOGICAL INTERVIEW (CPT: [id number])  Name: Deanna [name] Date of Birth: November 14, 1929 Date of Interview: 04/12/2016  Reason for Referral:  Deanna [name] is a 83 y.o. female who is referred for neuropsychological evaluation by Dr. Roma Schanz of [facility] due to concerns about cognitive changes s/p subdural hematoma in May 2017. This patient is accompanied in the office by her daughter and primary caregiver, Deanna [name], who supplements the history.   History of Presenting Problem:  Deanna [name] was in her normal state of health (daughter reported there had been some mild and gradual cognitive decline over the prior few years but patient was still independent for all IADLs) when she tripped over a dog and fell while visiting family in [place] on 08/29/2015. There was no evidence of superficial head trauma. There was no LOC. The patient refused to go to the hospital and instead decided to rest in bed. She developed nausea and vomitting and her family called EMS. She was taken to the ED where CT of the head, according to report, showed "acute right subdural hemorrhage with 1 cm right to left midline shift. - Acute left frontal and middle cranial fossa hemorrhage measures up to 0.4 cm. - Acute subdural hemorrhage noted along the tentorium. - Moderate to severe burden of presumed chronic microvascular ischemic disease. - Upper limits of normal to borderline enlarged ophthalmic veins; differential includes increased intracranial pressure, carotid cavernous fistula or varix." GCS was 14 on arrival to ED but declined to M3 within hours of admission. She was seen by the neurosurgery team and the family initially elected for comfort care (patient has DNR); however, Deanna [name] began answering questions appropriately and they rescinded comfort care and elected for surgical intervention. She underwent right craniotomy and evacuation of SDH on 09/01/2015. On 5/31 she had an episode of altered mental status  and hypotension prompting return to the ICU. CT showed distal obstructing ureteral stones and issues with incomplete bladder emptying; she also had UTI. Mental status improved. Antibiotics were started and she underwent cytoscopy and right sided stent placement on 09/04/2015. She was discharged and transferred to SNF/short term rehabilitation at [facility] on 09/08/2015. Her daughter reported that Deanna [name] demonstrated agitation and variable participation in therapies while at [facility], due to her cognitive/behavioral symptoms. Her daughter felt [facility] was not a good fit for a patient with brain injury. She was discharged home with home health, OT, PT, nursing and CNA on 09/25/2015, to her daughter's home where she had been living for the past 12 years. Her home health ended two months ago. Currently the patient's daughter, Deanna [name], is her primary caregiver (provides bathing and does all IADLs for the patient). Deanna [name]'s son (the patient's grandson) has been providing supervision and assistance during the day while Deanna [name] is at work. However, he will be moving soon and Deanna [name] will need to hire assistance. The patient does have a Biochemist, clinical.  Deanna [name] reports that prior to her fall in 08/2015, the patient was very active and "on the go all the time". She was an avid reader. She had demonstrated some mild memory loss over the prior year, and she did get lost when driving on one occasion about 9 months prior to her head injury. Her family significantly limited her driving at that time. Prior to her fall/head injury, she was managing her own medications without difficulty. She was managing her finances without difficulty. She was doing her own laundry and the family's laundry on a daily  basis without difficulty.   In terms of basic ADLs, after returning home to her daughter's house, the patient was able to follow verbal cues to bathe herself, but in the past few weeks, she has required more hands-on  assistance because she is not thorough enough in her cleaning. Additionally in the past few weeks she has not been cleaning herself thoroughly enough after toileting. She is having some incontinence as well. She can still feed herself but the food needs to be placed in front of her. She often forgets she has just eaten and will eat another meal soon afterwards, which has led to some weight gain.  The patient is not currently able to manage any instrumental ADLs. She is not driving. Her daughter is managing her medications including daily administration. Her daughter is managing her finances. She had to take the patient's credit card away from her because she was buying random items she saw on TV such as $500 worth of diet pills. Her daughter manages her appointments and takes her to them. The patient does not cook or prepare food. She does not do any housekeeping or laundry.  Mrs. Klehr daughter reports that the patient's cognitive functioning and behavior has improved from time of hospitalization/rehab, but she is still clearly severely limited. The patient no longer is demonstrating agitation or irritability. She is not experiencing hallucinations. She is pleasant and generally agreeable and "her sense of humor is still there". Her daughter reports her short term memory is impaired and "new information doesn't seem to be going from short term to long term memory". After celebrating Christmas with her family, she asked when they would be having Christmas. She also embellishes/confabulates and tends to weave in stories from movies she has watched into what has happened in real life. Additionally, she has no prospective memory - she cannot remember to do things in the future that need to be done.  Upon direct questioning, the patient's daughter/caregiver reported the following with regard to current cognitive functioning:  Forgetting recent conversations/events: Yes. And insists something has happened when it  really hasn't. Repeating statements/questions: Yes, within minutes.  Misplacing/losing items: No, but she keeps everything around her that she uses, and she does not move around the house very much at all. Word-finding difficulty: Yes Word substitutions: Yes Comprehension difficulty: No   With regard to mood, the patient denies any depression or anxiety. Deanna [name] agrees that she is "in a good mood all the time now". Deanna [name] notes that the patient has been "even keel" her whole life. Psychiatric history was denied.   Unfortunately the patient is having difficulty sleeping at night. She reported, "I think I forgot how to sleep." She refuses to sleep in her bed at night and stays in her recliner. She is not sure why she does not want to sleep in her bed. She does not have a regular schedule during the day and has very little to no movement let alone exercise. She takes "cat naps" during the day.   The patient has not had any falls since the one in 08/2015. She does not walk around the house at all and she only leaves the house for doctor's appointments. She walks from the bathroom to her chair and that is it. She has a Marketing executive to use at home. She came to today's appointment in a wheelchair and had a cane with her. Her daughter noted that she discovered a wound on her mother's stomach yesterday. They have contacted Dr. Etter Sjogren about  this.   The patient primarily spends her time watching television and playing Candy Crush on her ipad. She refuses to do most activities that her family suggests.    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 4 children (2 sons, 2 daughters). 6 grands (all boys).  Alcohol/Tobacco/Substances: None. Never drank alcohol. Former smoker (smoked about 65 years) - just stopped about 6 years ago. No SA.      Medical History: Past Medical History:  Diagnosis Date  . AAA (abdominal aortic aneurysm) [facility]) 2011   Dr Early repaired this  . Acute ischemic colitis ([place]) 03/06/2012  . Anemia    patient denies   . Arthritis    bilateral hands and knees  . Bleeding ulcer 1980s   H. Pylori  . Blood transfusion without reported diagnosis   . Cancer ([place])    hx of skin cancer on face   . Cataract    In the past  . Chronic urinary tract infection   . Cirrhosis, biliary ([place])    liver bx 01/2008  . Diastolic CHF ([place]) [id number]  . Epistaxis   . Fatigue   . GERD (gastroesophageal reflux disease)   . HLD (hyperlipidemia)   . HTN (hypertension)   . Hypertension   . Leg pain    bilateral  . OSA (obstructive sleep apnea)    mild on ss of 12/2008  . Pneumonia    hx of years ago   . Short-term memory loss    related to trauma of 08/2015   . Subdural hematoma ([place]) 08/2015  . Thoracic aortic aneurysm [facility])     Current Medications:  Outpatient Encounter Prescriptions as of 04/12/2016  Medication Sig  . acetaminophen (TYLENOL) 500 MG tablet Take 500 mg by mouth every 4 (four) hours as needed for mild pain.  Marland Kitchen amLODipine (NORVASC) 5 MG tablet TAKE 1 TABLET EVERY DAY  . docusate sodium (COLACE) 100 MG capsule Take 100 mg by mouth daily.  . fenofibrate 160 MG tablet Take 1 tablet (160 mg total) by mouth daily.  . Heparin, Porcine, in NaCl 15000-0.9 UT/500ML-% SOLN Inject 1 mL into the skin 2 (two) times daily. (Patient not taking: Reported on 12/31/2015)  . HYDROcodone-acetaminophen (NORCO) 5-325 MG tablet Take 1 tablet by mouth every 6 (six) hours as needed for moderate pain. (Patient not taking: Reported on 12/31/2015)  . losartan (COZAAR) 100 MG tablet Take 1 tablet (100 mg total) by mouth daily.  . magnesium hydroxide (MILK OF MAGNESIA) 400 MG/5ML suspension Take 30 mLs by mouth daily as needed for mild constipation.   No facility-administered encounter medications on file as of 04/12/2016.       Behavioral Observations:   Appearance: Casually and appropriately dressed and groomed Gait: N/A, seated in wheelchair Speech: Fluent; normal rate, rhythm and volume. Repeats herself at times. Thought process: Seems linear Affect: Full, euthymic Interpersonal: Pleasant, charming, poor historian due to memory deficits   TESTING: There is medical necessity to proceed with neuropsychological assessment as the results will be used to aid in differential diagnosis and clinical decision-making and to inform specific treatment recommendations. The patient sustained a brain injury (SDH) in 08/2015. Per the patient's primary caregiver/daughter and medical records reviewed, there has been a significant change in cognitive functioning and a reasonable suspicion of dementia.   PLAN: The patient will  return for a full battery of neuropsychological testing with a psychometrician under my supervision. Education regarding testing procedures was provided. Subsequently, the patient will see this provider for a follow-up session at which time her test performances and my impressions and treatment recommendations will be reviewed in detail.   Full neuropsychological evaluation report to follow.

## 2016-04-12 NOTE — Telephone Encounter (Signed)
We can order wound care through home health Powder will help keep it dry--- send nystatin powder to the pharmacy--- apply bid

## 2016-04-13 ENCOUNTER — Encounter: Payer: Self-pay | Admitting: Psychology

## 2016-04-22 NOTE — Telephone Encounter (Signed)
Ok to send nystatin powder tid

## 2016-04-27 ENCOUNTER — Ambulatory Visit (INDEPENDENT_AMBULATORY_CARE_PROVIDER_SITE_OTHER): Payer: Medicare Other | Admitting: Psychology

## 2016-04-27 DIAGNOSIS — R413 Other amnesia: Secondary | ICD-10-CM

## 2016-04-27 DIAGNOSIS — S065X0S Traumatic subdural hemorrhage without loss of consciousness, sequela: Secondary | ICD-10-CM | POA: Diagnosis not present

## 2016-04-27 NOTE — Progress Notes (Signed)
   Neuropsychology Note  Deanna [name] returned today for 2 hours of neuropsychological testing with technician, Milana Kidney, BS, under the supervision of Dr. Macarthur Critchley. The patient did not appear overtly distressed by the testing session, per behavioral observation or via self-report to the technician. Rest breaks were offered. Deanna [name] will return within 2 weeks for a feedback session with Dr. Si Raider at which time her test performances, clinical impressions and treatment recommendations will be reviewed in detail. The patient understands she can contact our office should she require our assistance before this time.  Full report to follow.

## 2016-05-11 NOTE — Progress Notes (Signed)
NEUROPSYCHOLOGICAL EVALUATION   Name:    Deanna [name]  Date of Birth:   [birth year] Date of Interview:  04/12/2016 Date of Testing:  04/27/2016   Date of Feedback:  05/12/2016       Background Information:  Reason for Referral:  Deanna [name] is a 83 y.o. female referred by Dr. Roma [name]  to assess her current level of cognitive functioning and assist in differential diagnosis. The current evaluation consisted of a review of available medical records, an interview with the patient and her daughter and primary caregiver, Deanna [name], and the completion of a neuropsychological testing battery. Informed consent was obtained.  History of Presenting Problem:  Deanna [name] was in her normal state of health (daughter reported there had been some mild and gradual cognitive decline over the prior few years but patient was still independent for all IADLs) when she tripped over a dog and fell while visiting family in [place] on 08/29/2015. There was no evidence of superficial head trauma. There was no LOC. The patient refused to go to the hospital and instead decided to rest in bed. She developed nausea and vomitting and her family called EMS. She was taken to the ED where CT of the head, according to report, showed "acute right subdural hemorrhage with 1 cm right to left midline shift. - Acute left frontal and middle cranial fossa hemorrhage measures up to 0.4 cm. - Acute subdural hemorrhage noted along the tentorium. - Moderate to severe burden of presumed chronic microvascular ischemic disease. - Upper limits of normal to borderline enlarged ophthalmic veins; differential includes increased intracranial pressure, carotid cavernous fistula or varix." GCS was 14 on arrival to ED but declined to M3 within hours of admission. She was seen by the neurosurgery team and the family initially elected for comfort care (patient has DNR); however, Deanna [name] began answering questions appropriately and they rescinded  comfort care and elected for surgical intervention. She underwent right craniotomy and evacuation of SDH on 09/01/2015. On 5/31 she had an episode of altered mental status and hypotension prompting return to the ICU. CT showed distal obstructing ureteral stones and issues with incomplete bladder emptying; she also had UTI. Mental status improved. Antibiotics were started and she underwent cytoscopy and right sided stent placement on 09/04/2015. She was discharged and transferred to SNF/short term rehabilitation at [facility] on 09/08/2015. Her daughter reported that Deanna [name] demonstrated agitation and variable participation in therapies while at [facility], due to her cognitive/behavioral symptoms. Her daughter felt Deanna [name] was not a good fit for a patient with brain injury. She was discharged home with home health, OT, PT, nursing and CNA on 09/25/2015, to her daughter's home where she had been living for the past 12 years. Her home health ended two months ago. Currently the patient's daughter, Deanna [name], is her primary caregiver (provides bathing and does all IADLs for the patient). Deanna [name]'s son (the patient's grandson) has been providing supervision and assistance during the day while Deanna [name] is at work. However, he will be moving soon and Deanna [name] will need to hire assistance. The patient does have a Biochemist, clinical.  Deanna [name] reports that prior to her fall in 08/2015, the patient was very active and "on the go all the time". She was an avid reader. She had demonstrated some mild memory loss over the prior year, and she did get lost when driving on one occasion about 9 months prior to her head injury. Her family significantly limited her driving  at that time. Prior to her fall/head injury, she was managing her own medications without difficulty. She was managing her finances without difficulty. She was doing her own laundry and the family's laundry on a daily basis without difficulty.   In terms of basic  ADLs, after returning home to her daughter's house, the patient was able to follow verbal cues to bathe herself, but in the past few weeks, she has required more hands-on assistance because she is not thorough enough in her cleaning. Additionally in the past few weeks she has not been cleaning herself thoroughly enough after toileting. She is having some incontinence as well. She can still feed herself but the food needs to be placed in front of her. She often forgets she has just eaten and will eat another meal soon afterwards, which has led to some weight gain.  The patient is not currently able to manage any instrumental ADLs. She is not driving. Her daughter is managing her medications including daily administration. Her daughter is managing her finances. She had to take the patient's credit card away from her because she was buying random items she saw on TV such as $500 worth of diet pills. Her daughter manages her appointments and takes her to them. The patient does not cook or prepare food. She does not do any housekeeping or laundry.  Deanna [name]'s daughter reports that the patient's cognitive functioning and behavior has improved from time of hospitalization/rehab, but she is still clearly severely limited. The patient no longer is demonstrating agitation or irritability. She is not experiencing hallucinations. She is pleasant and generally agreeable and "her sense of humor is still there". Her daughter reports her short term memory is impaired and "new information doesn't seem to be going from short term to long term memory". After celebrating Christmas with her family, she asked when they would be having Christmas. She also embellishes/confabulates and tends to weave in stories from movies she has watched into what has happened in real life. Additionally, she has no prospective memory - she cannot remember to do things in the future that need to be done.  Upon direct questioning, the patient's  daughter/caregiver reported the following with regard to current cognitive functioning:  Forgetting recent conversations/events: Yes. And insists something has happened when it really hasn't. Repeating statements/questions: Yes, within minutes.  Misplacing/losing items: No, but she keeps everything around her that she uses, and she does not move around the house very much at all. Word-finding difficulty: Yes Word substitutions: Yes Comprehension difficulty: No   With regard to mood, the patient denies any depression or anxiety. Deanna [name] agrees that she is "in a good mood all the time now". Deanna [name] notes that the patient has been "even keel" her whole life. Psychiatric history was denied.   Unfortunately the patient is having difficulty sleeping at night. She reported, "I think I forgot how to sleep." She refuses to sleep in her bed at night and stays in her recliner. She is not sure why she does not want to sleep in her bed. She does not have a regular schedule during the day and has very little to no movement let alone exercise. She takes "cat naps" during the day.   The patient has not had any falls since the one in 08/2015. She does not walk around the house at all and she only leaves the house for doctor's appointments. She walks from the bathroom to her chair and that is it. She has a rollater to   use at home. She came to today's appointment in a wheelchair and had a cane with her. Her daughter noted that she discovered a wound on her mother's stomach the day prior to her initial interview appointment with me. They were in contact with Dr. Etter Sjogren about this.  The patient primarily spends her time watching television and playing Candy Crush on her ipad. She refuses to do most activities that her family suggests.    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 4 children (2 sons, 2 daughters). 6 grands (all boys).  Alcohol/Tobacco/Substances: None. Never drank alcohol. Former smoker (smoked about 65 years) - just stopped about 6 years ago. No SA.    Medical History:  Past Medical History:  Diagnosis Date  . AAA (abdominal aortic aneurysm) [facility]) 2011   Dr Early repaired this  . Acute ischemic colitis ([place]) 03/06/2012  . Anemia    patient denies   . Arthritis    bilateral hands and knees  . Bleeding ulcer 1980s   H. Pylori  . Blood transfusion without reported diagnosis   . Cancer ([place])    hx of skin cancer on face   . Cataract    In the past  . Chronic urinary tract infection   . Cirrhosis, biliary ([place])    liver bx 01/2008  . Diastolic CHF ([place]) 2/35/5732  . Epistaxis   . Fatigue   . GERD (gastroesophageal reflux disease)   . HLD (hyperlipidemia)   . HTN (hypertension)   . Hypertension   . Leg pain    bilateral  . OSA (obstructive sleep apnea)    mild on ss of 12/2008  . Pneumonia    hx of years ago   . Short-term memory loss    related to trauma of 08/2015   . Subdural hematoma ([place]) 08/2015  . Thoracic aortic aneurysm [facility])     Current medications:  Outpatient Encounter Prescriptions as of 05/12/2016  Medication Sig  . acetaminophen (TYLENOL) 500 MG tablet Take 500 mg by mouth every 4 (four) hours as needed for mild pain.  Marland Kitchen amLODipine (NORVASC) 5 MG tablet TAKE 1 TABLET EVERY DAY  . docusate sodium (COLACE) 100 MG capsule Take 100 mg by mouth daily.  . fenofibrate 160 MG tablet Take 1 tablet (160 mg total) by mouth daily.  . Heparin, Porcine, in NaCl 15000-0.9 UT/500ML-% SOLN Inject 1 mL into the skin 2 (two) times daily. (Patient not taking: Reported on 12/31/2015)  . HYDROcodone-acetaminophen (NORCO) 5-325 MG tablet Take 1 tablet by mouth every 6 (six) hours as needed for moderate pain. (Patient not taking: Reported on 12/31/2015)  . losartan (COZAAR) 100 MG tablet Take 1  tablet (100 mg total) by mouth daily.  . magnesium hydroxide (MILK OF MAGNESIA) 400 MG/5ML suspension Take 30 mLs by mouth daily as needed for mild constipation.   No facility-administered encounter medications on file as of 05/12/2016.      Current Examination:  Behavioral Observations:   Appearance: Casually and appropriately dressed and groomed Gait: N/A, seated in wheelchair Speech: Fluent; normal rate, rhythm and volume. Repeats herself at times. Thought process: Seems linear Affect: Full, euthymic Interpersonal: Pleasant, charming, poor historian due to memory deficits Orientation: Oriented to person, city/state, year and day of the week. Disoriented to month (reported October), date (4 days off), office/street name.  Could not accurately name current President or his predecessor.  Tests Administered: . Test of Premorbid Functioning (TOPF) . Wechsler Adult Intelligence Scale-Fourth Edition (WAIS-IV): Similarities and Block Design subtests . Repeatable Battery for the Assessment of Neuropsychological Status (RBANS) Form A  . Boston Naming Test (BNT) . Controlled Oral Word Association Test (COWAT) . Trail Making Test A and B . Clock drawing test . Geriatric Depression Scale (GDS) 15 Item  Test Results: Note: Standardized scores are presented only for use by appropriately trained professionals and to allow for any future test-retest comparison. These scores should not be interpreted without consideration of all the information that is contained in the rest of the report. The most recent standardization samples from the test publisher or other sources were used whenever possible to derive standard scores; scores were corrected for age, gender, ethnicity and education when available.   Test Scores:  Test Name Raw Score Standardized Score Descriptor  TOPF 46/70 SS= 104 Average  WAIS-IV Subtests     Similarities 17/36 ss= 8 Low end of average  Block Design 28/66 ss= 11 Average    RBANS Index Scores     Immediate Memory  SS= 57 Extremely low  Visual spatial-Constructional  SS= 84 Low average  Language  SS= 83 Low average  Attention  SS= 100 Average  Delayed Memory  SS= 68 Extremely low  Total Scale  SS= 73 Borderline  BNT 48/60 T= 46 Average  COWAT-FAS 30 T= 44 Average  COWAT-Animals 7 T= 28 Impaired  Trail Making Test A  55" 0 errors T= 52 Average  Trail Making Test B D/C at 300" on #7 3 errors   Impaired  Clock Drawing   Impaired  GDS-15 0/15  WNL       Description of Test Results:  Premorbid verbal intellectual abilities were estimated to have been within the average range based on a test of word reading. Psychomotor processing speed was low average. Basic auditory attention was high average. Visual-spatial construction was average. Language abilities were variable. Specifically, confrontation naming was average, while semantic verbal fluency was impaired. With regard to verbal memory, encoding and acquisition of non-contextual information (i.e., word list) was impaired. After a short delay, recall was low average although she only recalled 1/10 words. Performance on a yes/no recognition task was low average (18/20 correct). On another verbal memory test, encoding and acquisition of contextual auditory information (i.e., short story) was borderline impaired. After a delay, free recall was impaired (0 items recalled). With regard to non-verbal memory, delayed free recall of visual information was impaired. Executive functioning was variable. Mental flexibility and set-shifting were severely impaired; she was unable to completeTrails B, and she committed three errors within 300 seconds. Verbal fluency with phonemic search restrictions was average. Verbal abstract reasoning was low end of average. Performance on a clock drawing task was impaired due to incorrect time setting. On a self-report measure of mood, the patient's responses were not  indicative of clinically  significant depression at the present time.    Clinical Impressions: Major Neurocognitive Disorder due to subdural hemorrhage in 08/2015. Results of this evaluation are abnormal, demonstrating significant impairment in memory and executive function. Additionally, there is clear evidence that her cognitive deficits are interfering with her ability to perform IADLs. As such, diagnostic criteria for a major neurocognitive disorder (dementia) are met. I suspect she may have have had mild cognitive impairment prior to her fall and subdural hemorrhage in 08/2015, per her daughter's report, but she seems   to have significant worsening of cognitive function due to the subdural hemorrhage. She did evidence some improvement after the injury but she has likely plateaued at this time. Fortunately, her behavioral disturbance has improved and she is no longer demonstrating agitation, non-compliance, etc. Her mood is good.    Recommendations/Plan: Based on the findings of the present evaluation, the following recommendations are offered:  1. The patient is no longer able to manage instrumental ADLs due to cognitive deficits (and in some cases physical limitations as well). She will need someone to manage her medications, finances and meals. She should not return to driving. I also would recommend that she not be left alone longer than one hour at a time. 2. The patient will likely benefit from implementation of a regular daily schedule (e.g., consistent wake/sleep times, meal times, bathing/hygiene times, physical activity, etc). This will help maximize cognitive functioning and may also improve sleep. To the extent possible, some movement / physical exercise should be implemented into her routine. 3. Caregiver support will be important for her daughter and family. They are encouraged to seek home health caregiving assistance, and it is my hope that the patient's long term care insurance policy would cover this. Having  home health caregiving assistance would allow the patient to stay living her daughter's home; otherwise, assisted living would need to be considered.   Feedback to Patient: Deanna [name] and her daughter returned for a feedback appointment on 05/12/2016 to review the results of her neuropsychological evaluation with this provider. 30 minutes face-to-face time was spent reviewing her test results, my impressions and my recommendations as detailed above.    Total time spent on this patient's case: [id number] unit for interview with psychologist; [phone] units of testing by psychometrician under psychologist's supervision; [phone] units for medical record review, scoring of neuropsychological tests, interpretation of test results, preparation of this report, and review of results to the patient by psychologist.      Thank you for your referral of Deanna [name]. Please feel free to contact me if you have any questions or concerns regarding this report.

## 2016-05-12 ENCOUNTER — Ambulatory Visit (INDEPENDENT_AMBULATORY_CARE_PROVIDER_SITE_OTHER): Payer: Medicare Other | Admitting: Psychology

## 2016-05-12 ENCOUNTER — Encounter: Payer: Self-pay | Admitting: Psychology

## 2016-05-12 DIAGNOSIS — S065X0S Traumatic subdural hemorrhage without loss of consciousness, sequela: Secondary | ICD-10-CM | POA: Diagnosis not present

## 2016-05-12 DIAGNOSIS — F0280 Dementia in other diseases classified elsewhere without behavioral disturbance: Secondary | ICD-10-CM

## 2016-06-09 ENCOUNTER — Ambulatory Visit
Admission: RE | Admit: 2016-06-09 | Discharge: 2016-06-09 | Disposition: A | Discharge status: Home / Self Care | Payer: Medicare Other | Source: Ambulatory Visit | Attending: Vascular Surgery | Admitting: Vascular Surgery

## 2016-06-09 DIAGNOSIS — I716 Thoracoabdominal aortic aneurysm, without rupture, unspecified: Secondary | ICD-10-CM

## 2016-06-09 MED ORDER — IOPAMIDOL (ISOVUE-370) INJECTION 76%
60.0000 mL | Freq: Once | INTRAVENOUS | Status: AC | PRN
  Administered 2016-06-09: 60 mL via INTRAVENOUS

## 2016-06-16 ENCOUNTER — Encounter: Payer: Self-pay | Admitting: Vascular Surgery

## 2016-06-17 ENCOUNTER — Other Ambulatory Visit: Payer: Self-pay | Admitting: Family Medicine

## 2016-06-17 DIAGNOSIS — I10 Essential (primary) hypertension: Secondary | ICD-10-CM

## 2016-06-28 ENCOUNTER — Encounter: Payer: Self-pay | Admitting: Vascular Surgery

## 2016-06-28 ENCOUNTER — Ambulatory Visit (INDEPENDENT_AMBULATORY_CARE_PROVIDER_SITE_OTHER): Payer: Medicare Other | Admitting: Vascular Surgery

## 2016-06-28 VITALS — BP 117/76 | HR 76 | Temp 97.0°F | Resp 18 | Ht 68.0 in | Wt 220.0 lb

## 2016-06-28 DIAGNOSIS — I716 Thoracoabdominal aortic aneurysm, without rupture, unspecified: Secondary | ICD-10-CM

## 2016-06-28 NOTE — Progress Notes (Signed)
[facility] [place]  Patient name: Deanna [name] MRN: [id number] DOB: 01-30-1930 Sex: female  REASON FOR VISIT: Follow-up of known thoracoabdominal aneurysm  HPI: Deanna [name] is a 83 y.o. female here today for follow-up and discussion of recent CT scan of her chest and abdomen. She is here today with her daughter. She had a fall in May 2017 while in [place]. She suffered a severe intracranial bleed and was felt to be brain dead. She spontaneously recovered and underwent craniotomy and evacuation of hematoma. She is in a wheelchair currently. She apparently has had the loss of short-term memory but has long-term memory intact. Fortunately she remains her delightful witty self. She has no symptoms referable to her aneurysm.  Past Medical History:  Diagnosis Date  . AAA (abdominal aortic aneurysm) [facility]) 2011   Dr Idil Maslanka repaired this  . Acute ischemic colitis ([place]) 03/06/2012  . Anemia    patient denies   . Arthritis    bilateral hands and knees  . Bleeding ulcer 1980s   H. Pylori  . Blood transfusion without reported diagnosis   . Cancer ([place])    hx of skin cancer on face   . Cataract    In the past  . Chronic urinary tract infection   . 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 Diastolic CHF ([place]) 9/47/0962  . Epistaxis   . Fatigue   . GERD (gastroesophageal reflux disease)   . HLD (hyperlipidemia)   . HTN (hypertension)   . Hypertension   . Leg pain    bilateral  . OSA (obstructive sleep apnea)    mild on ss of 12/2008  . Pneumonia    hx of years ago   . Short-term memory loss    related to trauma of 08/2015   . Subdural hematoma ([place]) 08/2015  . Thoracic aortic aneurysm ([facility])     Family History  Problem Relation Age of Onset  . Cancer Brother     prostate    SOCIAL HISTORY: Social History  Substance Use Topics  . Smoking status: Former Smoker    Years: 52.00    Types: Cigarettes     Quit date: 02/01/2010  . Smokeless tobacco: Never Used  . Alcohol use No    Allergies  Allergen Reactions  . Keppra [Levetiracetam] Shortness Of Breath  . Crestor [Rosuvastatin] Other (See Comments)    Muscle weakness  . Ezetimibe Other (See Comments)    Muscle weakness    Current Outpatient Prescriptions  Medication Sig Dispense Refill  . acetaminophen (TYLENOL) 500 MG tablet Take 500 mg by mouth every 4 (four) hours as needed for mild pain.    Marland Kitchen amLODipine (NORVASC) 5 MG tablet TAKE 1 TABLET EVERY DAY 90 tablet 1  . docusate sodium (COLACE) 100 MG capsule Take 100 mg by mouth daily.    . fenofibrate 160 MG tablet Take 1 tablet (160 mg total) by mouth daily. 90 tablet 0  . losartan (COZAAR) 100 MG tablet Take 1 tablet (100 mg total) by mouth daily. 90 tablet 3  . losartan (COZAAR) 100 MG tablet TAKE 1 TABLET BY MOUTH DAILY 90 tablet 3  . magnesium hydroxide (MILK OF MAGNESIA) 400 MG/5ML suspension Take 30 mLs by mouth daily as needed for mild constipation.    . Heparin, Porcine, in NaCl 15000-0.9 UT/500ML-% SOLN Inject 1 mL into the skin 2 (two) times daily. (Patient not taking: Reported on 12/31/2015) 500 mL 0  .  HYDROcodone-acetaminophen (NORCO) 5-325 MG tablet Take 1 tablet by mouth every 6 (six) hours as needed for moderate pain. (Patient not taking: Reported on 12/31/2015) 15 tablet 0   No current facility-administered medications for this visit.     REVIEW OF SYSTEMS:  [X]  denotes positive finding, [ ]  denotes negative finding Cardiac  Comments:  Chest pain or chest pressure:    Shortness of breath upon exertion:    Short of breath when lying flat:    Irregular heart rhythm:        Vascular    Pain in calf, thigh, or hip brought on by ambulation:    Pain in feet at night that wakes you up from your sleep:     Blood clot in your veins:    Leg swelling:           PHYSICAL EXAM: Vitals:   06/28/16 1413  BP: 117/76  Pulse: 76  Resp: 18  Temp: 97 F (36.1 C)    TempSrc: Oral  SpO2: 96%  Weight: 220 lb (99.8 kg)  Height: 5\' 8"  (1.727 m)    GENERAL: The patient is a well-nourished female, in no acute distress. The vital signs are documented above. CARDIOVASCULAR: 2+ radial pulses bilaterally. Carotid arteries without bruits bilaterally PULMONARY: There is good air exchange  MUSCULOSKELETAL: There are no major deformities or cyanosis. NEUROLOGIC: No focal weakness or paresthesias are detected. Good grip strength bilaterally. Head with does have a deformity in her left brain with a craniotomy deformity. SKIN: There are no ulcers or rashes noted. PSYCHIATRIC: The patient has a normal affect.  DATA:  CT scan was reviewed. This shows maximal diameter and ascending arch of 5.5 cm. Maximal diameter of her aorta at the level of the renals proximally from 4.5 cm. She does also have a penetrating ulcer in her descending thoracic aorta.  MEDICAL ISSUES: Very long discussion with patient and her daughter present. I reassured them that she has not had any significant interval change in her CT scan. Splane there was no option for consideration of repair at this time. Also explained that with her advanced age and comorbidities that she would be a potentially prohibitive risk for surgery even if she develops an indication of such. I did explain the option of discontinuing ongoing follow-up of this in both Mrs. Schey and her daughter fully feel this is appropriate as do I. She will continue her usual activities without limitation and we will see her again on an as-needed basis    Rosetta Posner, MD [facility] Tel [phone] Pager [phone]

## 2016-06-30 ENCOUNTER — Ambulatory Visit: Payer: Medicare Other | Admitting: Family Medicine

## 2016-07-08 ENCOUNTER — Encounter: Payer: Self-pay | Admitting: Family Medicine

## 2016-07-08 ENCOUNTER — Ambulatory Visit (INDEPENDENT_AMBULATORY_CARE_PROVIDER_SITE_OTHER): Payer: Medicare Other | Admitting: Family Medicine

## 2016-07-08 VITALS — BP 108/78 | HR 81 | Temp 97.5°F | Resp 16 | Ht 68.0 in | Wt 209.8 lb

## 2016-07-08 DIAGNOSIS — E785 Hyperlipidemia, unspecified: Secondary | ICD-10-CM

## 2016-07-08 DIAGNOSIS — I10 Essential (primary) hypertension: Secondary | ICD-10-CM

## 2016-07-08 DIAGNOSIS — B372 Candidiasis of skin and nail: Secondary | ICD-10-CM | POA: Diagnosis not present

## 2016-07-08 LAB — COMPREHENSIVE METABOLIC PANEL
ALBUMIN: 3.6 g/dL (ref 3.5–5.2)
ALK PHOS: 72 U/L (ref 39–117)
ALT: 11 U/L (ref 0–35)
AST: 18 U/L (ref 0–37)
BUN: 22 mg/dL (ref 6–23)
CALCIUM: 9.8 mg/dL (ref 8.4–10.5)
CO2: 29 mEq/L (ref 19–32)
CREATININE: 1.24 mg/dL — AB (ref 0.40–1.20)
Chloride: 102 mEq/L (ref 96–112)
GFR: 43.71 mL/min — AB (ref >60.00)
Glucose, Bld: 110 mg/dL — ABNORMAL HIGH (ref 70–99)
Potassium: 4.3 mEq/L (ref 3.5–5.1)
Sodium: 138 mEq/L (ref 135–145)
TOTAL PROTEIN: 7.5 g/dL (ref 6.0–8.3)
Total Bilirubin: 0.6 mg/dL (ref 0.2–1.2)

## 2016-07-08 LAB — LIPID PANEL
CHOLESTEROL: 152 mg/dL (ref 0–200)
HDL: 38.4 mg/dL — AB (ref >39.00)
LDL Cholesterol: 88 mg/dL (ref 0–99)
NonHDL: 113.68
TRIGLYCERIDES: 127 mg/dL (ref 0.0–149.0)
Total CHOL/HDL Ratio: 4
VLDL: 25.4 mg/dL (ref 0.0–40.0)

## 2016-07-08 MED ORDER — NYSTATIN 100000 UNIT/GM EX CREA: 120.0000 "application " | TOPICAL_CREAM | Freq: Two times a day (BID) | CUTANEOUS | 2 refills | Status: DC

## 2016-07-08 MED ORDER — FENOFIBRATE 160 MG PO TABS: 160.0000 mg | ORAL_TABLET | Freq: Every day | ORAL | 1 refills | Status: DC

## 2016-07-08 MED ORDER — AMLODIPINE BESYLATE 5 MG PO TABS: 5.0000 mg | ORAL_TABLET | Freq: Every day | ORAL | 1 refills | Status: DC

## 2016-07-08 NOTE — Progress Notes (Signed)
Patient ID: Deanna [name], female   DOB: [birth year], 83 y.o.   MRN: [id number]     Subjective:  I acted as a Education administrator for Dr. Carollee Herter.  [name], [place]   Patient ID: Deanna [name], female    DOB: 1930-02-07, 83 y.o.   MRN: [id number]  Chief Complaint  Patient presents with  . Rash    on abdomen has used neosporin    HPI  Patient is in today for rash on abdomen.  She is here today with her daughter who states that 2 weeks ago patient gave her self a bath and did not dry well and left a bad a rash.  She has used neosporin which has helped clear it up a little bit.  Patient Care Team: Ann Held, DO as PCP - General Irine Seal, MD as Attending Physician (Urology)   Past Medical History:  Diagnosis Date  . AAA (abdominal aortic aneurysm) [facility]) 2011   Dr Early repaired this  . Acute ischemic colitis ([place]) 03/06/2012  . Anemia    patient denies   . Arthritis    bilateral hands and knees  . Bleeding ulcer 1980s   H. Pylori  . Blood transfusion without reported diagnosis   . Cancer ([place])    hx of skin cancer on face   . Cataract    In the past  . Chronic urinary tract infection   . Cirrhosis, biliary ([place])    liver bx 01/2008  . Diastolic CHF ([place]) 6/81/2751  . Epistaxis   . Fatigue   . GERD (gastroesophageal reflux disease)   . HLD (hyperlipidemia)   . HTN (hypertension)   . Hypertension   . Leg pain    bilateral  . OSA (obstructive sleep apnea)    mild on ss of 12/2008  . Pneumonia    hx of years ago   . Short-term memory loss    related to trauma of 08/2015   . Subdural hematoma ([place]) 08/2015  . Thoracic aortic aneurysm [facility])     Past Surgical History:  Procedure Laterality Date  . ABDOMINAL AORTIC ANEURYSM REPAIR  02/01/2010  . BREAST BIOPSY    . CATARACT EXTRACTION    . COLONOSCOPY  03/08/2012   Procedure: COLONOSCOPY;  Surgeon: Gatha Mayer, MD;  Location: [facility];  Service: Endoscopy;  Laterality: N/A;  . CRANIOTOMY     for evacuation of  subdural hematoma - 08/29/15   . CYSTOSCOPY WITH HOLMIUM LASER LITHOTRIPSY Right 11/05/2015   Procedure: CYSTOSCOPY WITH HOLMIUM LASER LITHOTRIPSY;  Surgeon: Irine Seal, MD;  [facility];  Service: Urology;  Laterality: Right;  . CYSTOSCOPY WITH URETEROSCOPY, STONE BASKETRY AND STENT PLACEMENT Right 11/05/2015   Procedure: CYSTOSCOPY WITH URETEROSCOPY, STONE BASKETRY AND STENT EXCHANGE;  Surgeon: Irine Seal, MD;  [facility];  Service: Urology;  Laterality: Right;  . ERCP N/A 04/20/2013   Procedure: ENDOSCOPIC RETROGRADE CHOLANGIOPANCREATOGRAPHY (ERCP);  Surgeon: Ladene Artist, MD;  [facility];  Service: Endoscopy;  Laterality: N/A;  . ILIAC ARTERY - FEMORAL ARTERY BYPASS GRAFT  2011   Dr Early    Family History  Problem Relation Age of Onset  . Cancer Brother     prostate    Social History   Social History  . Marital status: Widowed    Spouse name: N/A  . Number of children: N/A  . Years of education: N/A   Occupational History  . Not on file.   Social History Main Topics  .  Smoking status: Former Smoker    Years: 52.00    Types: Cigarettes    Quit date: 02/01/2010  . Smokeless tobacco: Never Used  . Alcohol use No  . Drug use: No  . Sexual activity: Not Currently   Other Topics Concern  . Not on file   Social History Narrative  . No narrative on file    Outpatient Medications Prior to Visit  Medication Sig Dispense Refill  . docusate sodium (COLACE) 100 MG capsule Take 100 mg by mouth daily.    Marland Kitchen losartan (COZAAR) 100 MG tablet Take 1 tablet (100 mg total) by mouth daily. 90 tablet 3  . amLODipine (NORVASC) 5 MG tablet TAKE 1 TABLET EVERY DAY 90 tablet 1  . fenofibrate 160 MG tablet Take 1 tablet (160 mg total) by mouth daily. 90 tablet 0  . acetaminophen (TYLENOL) 500 MG tablet Take 500 mg by mouth every 4 (four) hours as needed for mild pain.    . Heparin, Porcine, in NaCl 15000-0.9 UT/500ML-% SOLN Inject 1 mL into the skin 2 (two) times daily.  (Patient not taking: Reported on 12/31/2015) 500 mL 0  . HYDROcodone-acetaminophen (NORCO) 5-325 MG tablet Take 1 tablet by mouth every 6 (six) hours as needed for moderate pain. (Patient not taking: Reported on 12/31/2015) 15 tablet 0  . losartan (COZAAR) 100 MG tablet TAKE 1 TABLET BY MOUTH DAILY 90 tablet 3  . magnesium hydroxide (MILK OF MAGNESIA) 400 MG/5ML suspension Take 30 mLs by mouth daily as needed for mild constipation.     No facility-administered medications prior to visit.     Allergies  Allergen Reactions  . Keppra [Levetiracetam] Shortness Of Breath  . Crestor [Rosuvastatin] Other (See Comments)    Muscle weakness  . Ezetimibe Other (See Comments)    Muscle weakness    Review of Systems  Constitutional: Negative for chills, fever and malaise/fatigue.  HENT: Negative for congestion and hearing loss.   Eyes: Negative for discharge.  Respiratory: Negative for cough, sputum production and shortness of breath.   Cardiovascular: Negative for chest pain, palpitations and leg swelling.  Gastrointestinal: Negative for abdominal pain, blood in stool, constipation, diarrhea, heartburn, nausea and vomiting.  Genitourinary: Negative for dysuria, frequency, hematuria and urgency.  Musculoskeletal: Negative for back pain, falls and myalgias.  Skin: Positive for itching and rash.       Lower abdomen  Neurological: Negative for dizziness, sensory change, loss of consciousness, weakness and headaches.  Endo/Heme/Allergies: Negative for environmental allergies. Does not bruise/bleed easily.  Psychiatric/Behavioral: Negative for depression and suicidal ideas. The patient is not nervous/anxious and does not have insomnia.        Objective:    Physical Exam  Constitutional: She is oriented to person, place, and time. She appears well-developed and well-nourished. No distress.  HENT:  Head: Normocephalic and atraumatic.  Eyes: Conjunctivae are normal.  Neck: Normal range of motion.  No thyromegaly present.  Cardiovascular: Normal rate and regular rhythm.   Pulmonary/Chest: Effort normal and breath sounds normal. She has no wheezes.  Abdominal: Soft. Bowel sounds are normal. There is no tenderness.  Musculoskeletal: Normal range of motion. She exhibits no edema or deformity.  Neurological: She is alert and oriented to person, place, and time.  Skin: Skin is warm and dry. Rash noted. She is not diaphoretic.  Lower abdomen  Psychiatric: She has a normal mood and affect.  Nursing note and vitals reviewed.   BP 108/78 (BP Location: Right Arm, Cuff Size: Large)  Pulse 81   Temp 97.5 F (36.4 C) (Oral)   Resp 16   Ht 5\' 8"  (1.727 m)   Wt 209 lb 12.8 oz (95.2 kg)   SpO2 95%   BMI 31.90 kg/m  Wt Readings from Last 3 Encounters:  07/08/16 209 lb 12.8 oz (95.2 kg)  06/28/16 220 lb (99.8 kg)  12/31/15 197 lb (89.4 kg)   BP Readings from Last 3 Encounters:  07/08/16 108/78  06/28/16 117/76  12/31/15 (!) 154/81     Immunization History  Administered Date(s) Administered  . Hepatitis B 01/08/2010  . Influenza Whole 02/21/2007, 12/20/2007, 12/11/2009, 11/30/2010, 12/19/2012  . Influenza, High Dose Seasonal PF 12/14/2013, 12/31/2015  . Influenza-Unspecified 12/04/2014  . Pneumococcal Conjugate-13 06/25/2015  . Pneumococcal Polysaccharide-23 04/04/2004, 04/20/2005  . Td 04/04/2004    Health Maintenance  Topic Date Due  . TETANUS/TDAP  04/04/2014  . INFLUENZA VACCINE  11/02/2016  . DEXA SCAN  Completed  . PNA vac Low Risk Adult  Completed    Lab Results  Component Value Date   WBC 8.1 11/03/2015   HGB 15.1 (H) 11/03/2015   HCT 46.7 (H) 11/03/2015   PLT 235 11/03/2015   GLUCOSE 110 (H) 07/08/2016   CHOL 152 07/08/2016   TRIG 127.0 07/08/2016   HDL 38.40 (L) 07/08/2016   LDLDIRECT 140.0 12/31/2015   LDLCALC 88 07/08/2016   ALT 11 07/08/2016   AST 18 07/08/2016   NA 138 07/08/2016   K 4.3 07/08/2016   CL 102 07/08/2016   CREATININE 1.24 (H)  07/08/2016   BUN 22 07/08/2016   CO2 29 07/08/2016   TSH 1.54 09/10/2015   INR 1.07 11/03/2015   HGBA1C 5.5 06/24/2011    Lab Results  Component Value Date   TSH 1.54 09/10/2015   Lab Results  Component Value Date   WBC 8.1 11/03/2015   HGB 15.1 (H) 11/03/2015   HCT 46.7 (H) 11/03/2015   MCV 90.0 11/03/2015   PLT 235 11/03/2015   Lab Results  Component Value Date   NA 138 07/08/2016   K 4.3 07/08/2016   CO2 29 07/08/2016   GLUCOSE 110 (H) 07/08/2016   BUN 22 07/08/2016   CREATININE 1.24 (H) 07/08/2016   BILITOT 0.6 07/08/2016   ALKPHOS 72 07/08/2016   AST 18 07/08/2016   ALT 11 07/08/2016   PROT 7.5 07/08/2016   ALBUMIN 3.6 07/08/2016   CALCIUM 9.8 07/08/2016   ANIONGAP 9 11/03/2015   GFR 43.71 (L) 07/08/2016   Lab Results  Component Value Date   CHOL 152 07/08/2016   Lab Results  Component Value Date   HDL 38.40 (L) 07/08/2016   Lab Results  Component Value Date   LDLCALC 88 07/08/2016   Lab Results  Component Value Date   TRIG 127.0 07/08/2016   Lab Results  Component Value Date   CHOLHDL 4 07/08/2016   Lab Results  Component Value Date   HGBA1C 5.5 06/24/2011         Assessment & Plan:   Problem List Items Addressed This Visit      Unprioritized   Essential hypertension   Relevant Medications   amLODipine (NORVASC) 5 MG tablet   fenofibrate 160 MG tablet   Other Relevant Orders   Comprehensive metabolic panel (Completed)    Other Visit Diagnoses    Yeast infection of the skin    -  Primary   Relevant Medications   nystatin cream (MYCOSTATIN)   Hyperlipidemia LDL goal <100  Relevant Medications   amLODipine (NORVASC) 5 MG tablet   fenofibrate 160 MG tablet   Other Relevant Orders   Lipid panel (Completed)      I have discontinued Ms. Chio's acetaminophen, magnesium hydroxide, Heparin (Porcine) in NaCl, and HYDROcodone-acetaminophen. I have also changed her amLODipine. Additionally, I am having her start on nystatin  cream. Lastly, I am having her maintain her docusate sodium, losartan, and fenofibrate.  Meds ordered this encounter  Medications  . nystatin cream (MYCOSTATIN)    Sig: Apply 435 application topically 2 (two) times daily.    Dispense:  120 g    Refill:  2  . amLODipine (NORVASC) 5 MG tablet    Sig: Take 1 tablet (5 mg total) by mouth daily.    Dispense:  90 tablet    Refill:  1  . fenofibrate 160 MG tablet    Sig: Take 1 tablet (160 mg total) by mouth daily.    Dispense:  90 tablet    Refill:  1    CMA served as Education administrator during this visit. History, Physical and Plan performed by medical provider. Documentation and orders reviewed and attested to.  Ann Held, DO

## 2016-07-08 NOTE — Progress Notes (Signed)
Pre visit review using our clinic review tool, if applicable. No additional management support is needed unless otherwise documented below in the visit note. 

## 2016-07-08 NOTE — Patient Instructions (Signed)
Skin Yeast Infection Skin yeast infection is a condition in which there is an overgrowth of yeast (candida) that normally lives on the skin. This condition usually occurs in areas of the skin that are constantly warm and moist, such as the armpits or the groin. What are the causes? This condition is caused by a change in the normal balance of the yeast and bacteria that live on the skin. What increases the risk? This condition is more likely to develop in:  People who are obese.  Pregnant women.  Women who take birth control pills.  People who have diabetes.  People who take antibiotic medicines.  People who take steroid medicines.  People who are malnourished.  People who have a weak defense (immune) system.  People who are 80 years of age or older. What are the signs or symptoms? Symptoms of this condition include:  A red, swollen area of the skin.  Bumps on the skin.  Itchiness. How is this diagnosed? This condition is diagnosed with a medical history and physical exam. Your health care provider may check for yeast by taking light scrapings of the skin to be viewed under a microscope. How is this treated? This condition is treated with medicine. Medicines may be prescribed or be available over-the-counter. The medicines may be:  Taken by mouth (orally).  Applied as a cream. Follow these instructions at home:  Take or apply over-the-counter and prescription medicines only as told by your health care provider.  Eat more yogurt. This may help to keep your yeast infection from returning.  Maintain a healthy weight. If you need help losing weight, talk with your health care provider.  Keep your skin clean and dry.  If you have diabetes, keep your blood sugar under control. Contact a health care provider if:  Your symptoms go away and then return.  Your symptoms do not get better with treatment.  Your symptoms get worse.  Your rash spreads.  You have a fever  or chills.  You have new symptoms.  You have new warmth or redness of your skin. This information is not intended to replace advice given to you by your health care provider. Make sure you discuss any questions you have with your health care provider. Document Released: 12/07/2010 Document Revised: 11/15/2015 Document Reviewed: 09/22/2014 Elsevier Interactive Patient Education  2017 [employer].

## 2016-07-27 ENCOUNTER — Telehealth: Payer: Self-pay | Admitting: *Deleted

## 2016-07-27 NOTE — Telephone Encounter (Signed)
Spoke with daughter, advised that they go to pharmacy to get the shingles vaccine so they can run her card thru to see if it covered.  She will take her.

## 2016-12-28 ENCOUNTER — Other Ambulatory Visit: Payer: Self-pay | Admitting: Family Medicine

## 2017-01-24 ENCOUNTER — Other Ambulatory Visit: Payer: Self-pay | Admitting: Family Medicine

## 2017-01-24 NOTE — Telephone Encounter (Signed)
Requested drug refills are authorized, however only given #90, the patient needs further evaluation and/or laboratory testing before further refills are given. Ask her to make an appointment for this.//AB/CMA

## 2017-02-21 ENCOUNTER — Emergency Department (HOSPITAL_BASED_OUTPATIENT_CLINIC_OR_DEPARTMENT_OTHER): Payer: Medicare Other

## 2017-02-21 ENCOUNTER — Other Ambulatory Visit: Payer: Self-pay

## 2017-02-21 ENCOUNTER — Encounter (HOSPITAL_BASED_OUTPATIENT_CLINIC_OR_DEPARTMENT_OTHER): Payer: Self-pay | Admitting: *Deleted

## 2017-02-21 ENCOUNTER — Emergency Department (HOSPITAL_BASED_OUTPATIENT_CLINIC_OR_DEPARTMENT_OTHER)
Admission: EM | Admit: 2017-02-21 | Discharge: 2017-02-22 | Disposition: A | Discharge status: Home / Self Care | Payer: Medicare Other | Attending: Emergency Medicine | Admitting: Emergency Medicine

## 2017-02-21 DIAGNOSIS — Z85828 Personal history of other malignant neoplasm of skin: Secondary | ICD-10-CM | POA: Diagnosis not present

## 2017-02-21 DIAGNOSIS — Z79899 Other long term (current) drug therapy: Secondary | ICD-10-CM | POA: Diagnosis not present

## 2017-02-21 DIAGNOSIS — Y9389 Activity, other specified: Secondary | ICD-10-CM | POA: Diagnosis not present

## 2017-02-21 DIAGNOSIS — I5032 Chronic diastolic (congestive) heart failure: Secondary | ICD-10-CM | POA: Insufficient documentation

## 2017-02-21 DIAGNOSIS — W01190A Fall on same level from slipping, tripping and stumbling with subsequent striking against furniture, initial encounter: Secondary | ICD-10-CM | POA: Insufficient documentation

## 2017-02-21 DIAGNOSIS — I110 Hypertensive heart disease with heart failure: Secondary | ICD-10-CM | POA: Diagnosis not present

## 2017-02-21 DIAGNOSIS — S0190XA Unspecified open wound of unspecified part of head, initial encounter: Secondary | ICD-10-CM

## 2017-02-21 DIAGNOSIS — Z87891 Personal history of nicotine dependence: Secondary | ICD-10-CM | POA: Diagnosis not present

## 2017-02-21 DIAGNOSIS — Z9889 Other specified postprocedural states: Secondary | ICD-10-CM | POA: Diagnosis not present

## 2017-02-21 DIAGNOSIS — S0101XA Laceration without foreign body of scalp, initial encounter: Secondary | ICD-10-CM | POA: Diagnosis not present

## 2017-02-21 DIAGNOSIS — Y92009 Unspecified place in unspecified non-institutional (private) residence as the place of occurrence of the external cause: Secondary | ICD-10-CM | POA: Diagnosis not present

## 2017-02-21 DIAGNOSIS — Y999 Unspecified external cause status: Secondary | ICD-10-CM | POA: Insufficient documentation

## 2017-02-21 DIAGNOSIS — S0990XA Unspecified injury of head, initial encounter: Secondary | ICD-10-CM | POA: Diagnosis not present

## 2017-02-21 DIAGNOSIS — W07XXXA Fall from chair, initial encounter: Secondary | ICD-10-CM

## 2017-02-21 NOTE — ED Triage Notes (Signed)
Pt c/o fall from sitting , hitting head on wooden book shelf. ? Loc. NO blood thinners.  Blood noted from [place] holes from craniotomy  In 2017

## 2017-02-21 NOTE — ED Notes (Signed)
Pt wound cleansed with Safe Cleanse, xeroform applied and wrapped in coban per MD orders.

## 2017-02-21 NOTE — ED Notes (Signed)
ED Provider at bedside. 

## 2017-02-21 NOTE — ED Notes (Signed)
Patient transported to CT 

## 2017-02-21 NOTE — ED Provider Notes (Signed)
[facility] DEPT [facility] Provider Note: Georgena Spurling, MD, FACEP  CSN: [id number] MRN: [id number] ARRIVAL: 02/21/17 at [place]: [place]  Level 5 Caveat: dementia HISTORY OF PRESENT ILLNESS  02/21/17 11:14 PM Deanna [name] is a 83 y.o. female who had a craniotomy for right subdural hematoma a year and a half ago.  She has chronic nonhealing wounds of the scalp status post surgery.  She was reaching forward out of her recliner about 9:30 PM and fell forward striking her head.  There was no loss of consciousness and she has not been vomiting.  She has vague recollection of the incident but her family states this is baseline for her.  She denies any pain or other injury.   Past Medical History:  Diagnosis Date  . AAA (abdominal aortic aneurysm) [facility]) 2011   Dr Early repaired this  . Acute ischemic colitis ([place]) 03/06/2012  . Anemia    patient denies   . Arthritis    bilateral hands and knees  . Bleeding ulcer 1980s   H. Pylori  . Blood transfusion without reported diagnosis   . Cancer ([place])    hx of skin cancer on face   . Cataract    In the past  . Chronic urinary tract infection   . Cirrhosis, biliary ([place])    liver bx 01/2008  . Diastolic CHF ([place]) 07/26/5359  . Epistaxis   . Fatigue   . GERD (gastroesophageal reflux disease)   . HLD (hyperlipidemia)   . HTN (hypertension)   . Hypertension   . Leg pain    bilateral  . OSA (obstructive sleep apnea)    mild on ss of 12/2008  . Pneumonia    hx of years ago   . Short-term memory loss    related to trauma of 08/2015   . Subdural hematoma ([place]) 08/2015  . Thoracic aortic aneurysm [facility])     Past Surgical History:  Procedure Laterality Date  . ABDOMINAL AORTIC ANEURYSM REPAIR  02/01/2010  . BREAST BIOPSY    . CATARACT EXTRACTION    . COLONOSCOPY  03/08/2012   Procedure: COLONOSCOPY;  Surgeon: Gatha Mayer, MD;  Location: [place];  Service: Endoscopy;  Laterality: N/A;  .  CRANIOTOMY     for evacuation of subdural hematoma - 08/29/15   . CYSTOSCOPY WITH HOLMIUM LASER LITHOTRIPSY Right 11/05/2015   Procedure: CYSTOSCOPY WITH HOLMIUM LASER LITHOTRIPSY;  Surgeon: Irine Seal, MD;  [facility];  Service: Urology;  Laterality: Right;  . CYSTOSCOPY WITH URETEROSCOPY, STONE BASKETRY AND STENT PLACEMENT Right 11/05/2015   Procedure: CYSTOSCOPY WITH URETEROSCOPY, STONE BASKETRY AND STENT EXCHANGE;  Surgeon: Irine Seal, MD;  [facility];  Service: Urology;  Laterality: Right;  . ERCP N/A 04/20/2013   Procedure: ENDOSCOPIC RETROGRADE CHOLANGIOPANCREATOGRAPHY (ERCP);  Surgeon: Ladene Artist, MD;  [facility];  Service: Endoscopy;  Laterality: N/A;  . ILIAC ARTERY - FEMORAL ARTERY BYPASS GRAFT  2011   Dr Early    Family History  Problem Relation Age of Onset  . Cancer Brother        prostate    Social History   Tobacco Use  . Smoking status: Former Smoker    Years: 52.00    Types: Cigarettes    Last attempt to quit: 02/01/2010    Years since quitting: 7.0  . Smokeless tobacco: Never Used  Substance Use Topics  . Alcohol use: No    Alcohol/week: 0.0 oz  .  Drug use: No    Prior to Admission medications   Medication Sig Start Date End Date Taking? Authorizing Provider  amLODipine (NORVASC) 5 MG tablet TAKE 1 TABLET BY MOUTH DAILY 01/24/17   Carollee Herter, Alferd Apa, DO  docusate sodium (COLACE) 100 MG capsule Take 100 mg by mouth daily.    [provider]  fenofibrate 160 MG tablet TAKE 1 TABLET BY MOUTH DAILY 12/28/16   Carollee Herter, Alferd Apa, DO  losartan (COZAAR) 100 MG tablet Take 1 tablet (100 mg total) by mouth daily. 01/02/16   Ann Held, DO  nystatin cream (MYCOSTATIN) Apply 366 application topically 2 (two) times daily. 07/08/16   Ann Held, DO    Allergies Keppra [levetiracetam]; Crestor [rosuvastatin]; and Ezetimibe   REVIEW OF SYSTEMS     PHYSICAL EXAMINATION  Initial Vital Signs Blood pressure (!)  156/108, pulse 91, temperature 98 F (36.7 C), resp. rate 16, height 5\' 9"  (1.753 m), weight 95.3 kg (210 lb), SpO2 98 %.  Examination General: Well-developed, well-nourished female in no acute distress; appearance consistent with age of record HENT: normocephalic; chronic appearing poorly healing wounds of right parietal scalp with acute bleeding:    Eyes: pupils equal, round and reactive to light; extraocular muscles intact; lens implants Neck: supple; no C-spine tenderness Heart: regular rate and rhythm Lungs: clear to auscultation bilaterally Abdomen: soft; nondistended; nontender; bowel sounds present Extremities: No deformity; pulses normal; trace edema of lower legs; no tenderness on passive range of motion Neurologic: Awake, alert and oriented x 2; motor function intact in all extremities and symmetric; no facial droop; negative Romberg; normal finger to nose Skin: Warm and dry Psychiatric: Normal mood and affect   RESULTS  Summary of this visit's results, reviewed by myself:   EKG Interpretation  Date/Time:    Ventricular Rate:    PR Interval:    QRS Duration:   QT Interval:    QTC Calculation:   R Axis:     Text Interpretation:        Laboratory Studies: No results found for this or any previous visit (from the past 24 hour(s)). Imaging Studies: Ct Head Wo Contrast  Result Date: 02/21/2017 CLINICAL DATA:  83 year old female with head trauma. EXAM: CT HEAD WITHOUT CONTRAST TECHNIQUE: Contiguous axial images were obtained from the base of the skull through the vertex without intravenous contrast. COMPARISON:  Head CT dated 11/03/2015 FINDINGS: Evaluation of this exam is limited due to motion artifact. Brain: A linear high attenuating density in the subdural spaces of the right parietal lobe (series 2 image 26) most likely represent postsurgical dural mesh. A small subdural hemorrhage is not entirely excluded. Clinical correlation is recommended. No other acute  intracranial hemorrhage identified. There is no mass effect or midline shift. There is age-related atrophy and chronic microvascular ischemic changes as seen on the prior CT. Vascular: No hyperdense vessel or unexpected calcification. Skull: Right frontoparietal and temporal craniotomy. No acute fracture. Sinuses/Orbits: The visualized paranasal sinuses and mastoid air cells are clear. Bilateral cataract surgeries noted. Other: None IMPRESSION: Postsurgical changes of right frontoparietal and temporal craniotomy. A linear high attenuating density along the right parietal subdural space likely surgical mesh material. A small subdural hemorrhage is less likely. Clinical correlation is recommended. No mass effect or midline shift. Age-related atrophy and chronic microvascular ischemic changes. Electronically Signed   By: Anner Crete M.D.   On: 02/21/2017 23:34    ED COURSE  Nursing notes and initial vitals signs,  including pulse oximetry, reviewed.  Vitals:   02/21/17 2257 02/21/17 2304 02/21/17 2350 02/22/17 0118  BP:  (!) 156/108 (!) 179/99 (!) 147/86  Pulse:  91 79 82  Resp:  16 (!) 25 18  Temp:  98 F (36.7 C)    SpO2:  98% 97% 98%  Weight: 95.3 kg (210 lb)     Height: 5\' 9"  (1.753 m)      1:39 AM Patient has been observed for over 4 hours post injury.  She remains at her baseline mental status.  She is awake, alert, appropriate and with an intact sense of humor.  Her wounds have been bandaged with Xeroform and gauze.  Her daughter, who takes care of her mother's wounds, was instructed in bandage changes.  I see no reason for admission at this time.  I reviewed the CTs and do not believe she has an acute subdural hematoma.  However if there is a change in her mental status her family was advised to bring her back to the ED immediately.  PROCEDURES    ED DIAGNOSES     ICD-10-CM   1. Head injury, closed, initial encounter S09.90XA   2. Fall from chair, initial encounter W07.XXXA   3.  Chronic wound of head S01.90XA   4. Scalp laceration, initial encounter S01.[place], MD 02/22/17 [phone]

## 2017-05-04 ENCOUNTER — Other Ambulatory Visit: Payer: Self-pay | Admitting: Family Medicine

## 2017-05-12 ENCOUNTER — Encounter: Payer: Self-pay | Admitting: Family Medicine

## 2017-05-12 ENCOUNTER — Other Ambulatory Visit: Payer: Medicare Other

## 2017-05-12 ENCOUNTER — Ambulatory Visit (INDEPENDENT_AMBULATORY_CARE_PROVIDER_SITE_OTHER): Payer: Medicare Other | Admitting: Family Medicine

## 2017-05-12 VITALS — BP 128/82 | HR 85 | Temp 97.8°F | Resp 14 | Ht 68.11 in | Wt 217.0 lb

## 2017-05-12 DIAGNOSIS — I10 Essential (primary) hypertension: Secondary | ICD-10-CM | POA: Diagnosis not present

## 2017-05-12 DIAGNOSIS — E785 Hyperlipidemia, unspecified: Secondary | ICD-10-CM

## 2017-05-12 DIAGNOSIS — R5383 Other fatigue: Secondary | ICD-10-CM | POA: Diagnosis not present

## 2017-05-12 DIAGNOSIS — S0100XA Unspecified open wound of scalp, initial encounter: Secondary | ICD-10-CM

## 2017-05-12 LAB — CBC WITH DIFFERENTIAL/PLATELET
BASOS PCT: 0.5 %
Basophils Absolute: 38 cells/uL (ref 0–200)
EOS ABS: 0 {cells}/uL — AB (ref 15–500)
Eosinophils Relative: 0 %
HCT: 43.4 % (ref 35.0–45.0)
HEMOGLOBIN: 14.1 g/dL (ref 11.7–15.5)
Lymphs Abs: 2120 cells/uL (ref 850–3900)
MCH: 28.1 pg (ref 27.0–33.0)
MCHC: 32.5 g/dL (ref 32.0–36.0)
MCV: 86.6 fL (ref 80.0–100.0)
MONOS PCT: 7 %
MPV: 10.4 fL (ref 7.5–12.5)
NEUTROS ABS: 4910 {cells}/uL (ref 1500–7800)
Neutrophils Relative %: 64.6 %
Platelets: 271 10*3/uL (ref 140–400)
RBC: 5.01 10*6/uL (ref 3.80–5.10)
RDW: 15.1 % — ABNORMAL HIGH (ref 11.0–15.0)
Total Lymphocyte: 27.9 %
WBC: 7.6 10*3/uL (ref 3.8–10.8)
WBCMIX: 532 {cells}/uL (ref 200–950)

## 2017-05-12 LAB — TSH: TSH: 6.66 u[IU]/mL — ABNORMAL HIGH (ref 0.35–4.50)

## 2017-05-12 LAB — LIPID PANEL
Cholesterol: 143 mg/dL (ref 0–200)
HDL: 33.4 mg/dL — AB (ref >39.00)
LDL Cholesterol: 82 mg/dL (ref 0–99)
NonHDL: 109.73
Total CHOL/HDL Ratio: 4
Triglycerides: 140 mg/dL (ref 0.0–149.0)
VLDL: 28 mg/dL (ref 0.0–40.0)

## 2017-05-12 LAB — POC URINALSYSI DIPSTICK (AUTOMATED)
BILIRUBIN UA: NEGATIVE
Blood, UA: NEGATIVE
Glucose, UA: NEGATIVE
Ketones, UA: NEGATIVE
NITRITE UA: POSITIVE
PH UA: 6 (ref 5.0–8.0)
Protein, UA: NEGATIVE
Spec Grav, UA: 1.02 (ref 1.010–1.025)
Urobilinogen, UA: 1 E.U./dL

## 2017-05-12 MED ORDER — NYSTATIN 100000 UNIT/GM EX CREA: 120.0000 "application " | TOPICAL_CREAM | Freq: Two times a day (BID) | CUTANEOUS | 2 refills | Status: DC

## 2017-05-12 NOTE — Assessment & Plan Note (Signed)
Scabbed over but not healing No infection  Td given Refer to wound care

## 2017-05-12 NOTE — Progress Notes (Signed)
Subjective:  I acted as a Education administrator for [employer]. Yancey Flemings, [place]   Patient ID: Deanna [name], female    DOB: 12/31/1929, 84 y.o.   MRN: [id number]    HPI  Patient is in today for wound on head not healing properly.  She fell on 11/20 at home and hit her head on a book shelf.  They took her to ER but it has never healed.  Pt had a nurse look at it and said she needs wound care.   Patient Care Team: Carollee Herter, Alferd Apa, DO as PCP - General Irine Seal, MD as Attending Physician (Urology)   Past Medical History:  Diagnosis Date  . AAA (abdominal aortic aneurysm) [facility]) 2011   Dr Early repaired this  . Acute ischemic colitis ([employer]) 03/06/2012  . Anemia    patient denies   . Arthritis    bilateral hands and knees  . Bleeding ulcer 1980s   H. Pylori  . Blood transfusion without reported diagnosis   . Cancer ([place])    hx of skin cancer on face   . Cataract    In the past  . Chronic urinary tract infection   . Cirrhosis, biliary ([place])    liver bx 01/2008  . Diastolic CHF ([place]) 0/86/5784  . Epistaxis   . Fatigue   . GERD (gastroesophageal reflux disease)   . HLD (hyperlipidemia)   . HTN (hypertension)   . Hypertension   . Leg pain    bilateral  . OSA (obstructive sleep apnea)    mild on ss of 12/2008  . Pneumonia    hx of years ago   . Short-term memory loss    related to trauma of 08/2015   . Subdural hematoma ([place]) 08/2015  . Thoracic aortic aneurysm [facility])     Past Surgical History:  Procedure Laterality Date  . ABDOMINAL AORTIC ANEURYSM REPAIR  02/01/2010  . BREAST BIOPSY    . CATARACT EXTRACTION    . COLONOSCOPY  03/08/2012   Procedure: COLONOSCOPY;  Surgeon: Gatha Mayer, MD;  Location: [place];  Service: Endoscopy;  Laterality: N/A;  . CRANIOTOMY     for evacuation of subdural hematoma - 08/29/15   . CYSTOSCOPY WITH HOLMIUM LASER LITHOTRIPSY Right 11/05/2015   Procedure: CYSTOSCOPY WITH HOLMIUM LASER LITHOTRIPSY;  Surgeon: Irine Seal, MD;  [facility];   Service: Urology;  Laterality: Right;  . CYSTOSCOPY WITH URETEROSCOPY, STONE BASKETRY AND STENT PLACEMENT Right 11/05/2015   Procedure: CYSTOSCOPY WITH URETEROSCOPY, STONE BASKETRY AND STENT EXCHANGE;  Surgeon: Irine Seal, MD;  [facility];  Service: Urology;  Laterality: Right;  . ERCP N/A 04/20/2013   Procedure: ENDOSCOPIC RETROGRADE CHOLANGIOPANCREATOGRAPHY (ERCP);  Surgeon: Ladene Artist, MD;  [facility];  Service: Endoscopy;  Laterality: N/A;  . ILIAC ARTERY - FEMORAL ARTERY BYPASS GRAFT  2011   Dr Early    Family History  Problem Relation Age of Onset  . Cancer Brother        prostate    Social History   Socioeconomic History  . Marital status: Widowed    Spouse name: Not on file  . Number of children: Not on file  . Years of education: Not on file  . Highest education level: Not on file  Social Needs  . Financial resource strain: Not on file  . Food insecurity - worry: Not on file  . Food insecurity - inability: Not on file  . Transportation needs - medical: Not on file  .  Transportation needs - non-medical: Not on file  Occupational History  . Not on file  Tobacco Use  . Smoking status: Former Smoker    Years: 52.00    Types: Cigarettes    Last attempt to quit: 02/01/2010    Years since quitting: 7.2  . Smokeless tobacco: Never Used  Substance and Sexual Activity  . Alcohol use: No    Alcohol/week: 0.0 oz  . Drug use: No  . Sexual activity: Not Currently  Other Topics Concern  . Not on file  Social History Narrative  . Not on file    Outpatient Medications Prior to Visit  Medication Sig Dispense Refill  . amLODipine (NORVASC) 5 MG tablet TAKE 1 TABLET BY MOUTH EVERY DAY 90 tablet 0  . docusate sodium (COLACE) 100 MG capsule Take 100 mg by mouth daily.    . fenofibrate 160 MG tablet TAKE 1 TABLET BY MOUTH DAILY 90 tablet 0  . losartan (COZAAR) 100 MG tablet Take 1 tablet (100 mg total) by mouth daily. 90 tablet 3  . nystatin cream (MYCOSTATIN)  Apply 096 application topically 2 (two) times daily. 120 g 2   No facility-administered medications prior to visit.     Allergies  Allergen Reactions  . Keppra [Levetiracetam] Shortness Of Breath  . Crestor [Rosuvastatin] Other (See Comments)    Muscle weakness  . Ezetimibe Other (See Comments)    Muscle weakness    Review of Systems  Constitutional: Negative for chills, fever and malaise/fatigue.  HENT: Negative for congestion and hearing loss.   Eyes: Negative for discharge.  Respiratory: Negative for cough, sputum production and shortness of breath.   Cardiovascular: Negative for chest pain, palpitations and leg swelling.  Gastrointestinal: Negative for abdominal pain, blood in stool, constipation, diarrhea, heartburn, nausea and vomiting.  Genitourinary: Negative for dysuria, frequency, hematuria and urgency.  Musculoskeletal: Negative for back pain, falls and myalgias.  Skin: Negative for rash.  Neurological: Negative for weakness.       Mental status is at baseline  Endo/Heme/Allergies: Negative for environmental allergies. Does not bruise/bleed easily.  Psychiatric/Behavioral: Negative for depression and suicidal ideas. The patient is not nervous/anxious and does not have insomnia.        Objective:    Physical Exam  Constitutional: She is oriented to person, place, and time. She appears well-developed and well-nourished.  HENT:  Head: Normocephalic and atraumatic.  Eyes: Conjunctivae and EOM are normal.  Neck: Normal range of motion. Neck supple. No JVD present. Carotid bruit is not present. No thyromegaly present.  Cardiovascular: Normal rate, regular rhythm and normal heart sounds.  No murmur heard. Pulmonary/Chest: Effort normal and breath sounds normal. No respiratory distress. She has no wheezes. She has no rales. She exhibits no tenderness.  Musculoskeletal: She exhibits no edema.  Neurological: She is alert and oriented to person, place, and time.  Skin:    Scalp-- see picture + scab --- no signs of infection No errythema   Psychiatric: She has a normal mood and affect.  Nursing note and vitals reviewed.     BP 128/82 (BP Location: Left Arm, Patient Position: Sitting, Cuff Size: Large)   Pulse 85   Temp 97.8 F (36.6 C) (Oral)   Resp 14   Ht 5' 8.11" (1.73 m)   Wt 217 lb (98.4 kg)   SpO2 95%   BMI 32.89 kg/m  Wt Readings from Last 3 Encounters:  05/12/17 217 lb (98.4 kg)  02/21/17 210 lb (95.3 kg)  07/08/16 209  lb 12.8 oz (95.2 kg)   BP Readings from Last 3 Encounters:  05/12/17 128/82  02/22/17 (!) 147/86  07/08/16 108/78     Immunization History  Administered Date(s) Administered  . Hepatitis B 01/08/2010  . Influenza Whole 02/21/2007, 12/20/2007, 12/11/2009, 11/30/2010, 12/19/2012  . Influenza, High Dose Seasonal PF 12/14/2013, 12/31/2015  . Influenza-Unspecified 12/04/2014  . Pneumococcal Conjugate-13 06/25/2015  . Pneumococcal Polysaccharide-23 04/04/2004, 04/20/2005  . Td 04/04/2004    Health Maintenance  Topic Date Due  . TETANUS/TDAP  04/04/2014  . INFLUENZA VACCINE  Completed  . DEXA SCAN  Completed  . PNA vac Low Risk Adult  Completed    Lab Results  Component Value Date   WBC 8.1 11/03/2015   HGB 15.1 (H) 11/03/2015   HCT 46.7 (H) 11/03/2015   PLT 235 11/03/2015   GLUCOSE 110 (H) 07/08/2016   CHOL 152 07/08/2016   TRIG 127.0 07/08/2016   HDL 38.40 (L) 07/08/2016   LDLDIRECT 140.0 12/31/2015   LDLCALC 88 07/08/2016   ALT 11 07/08/2016   AST 18 07/08/2016   NA 138 07/08/2016   K 4.3 07/08/2016   CL 102 07/08/2016   CREATININE 1.24 (H) 07/08/2016   BUN 22 07/08/2016   CO2 29 07/08/2016   TSH 1.54 09/10/2015   INR 1.07 11/03/2015   HGBA1C 5.5 06/24/2011    Lab Results  Component Value Date   TSH 1.54 09/10/2015   Lab Results  Component Value Date   WBC 8.1 11/03/2015   HGB 15.1 (H) 11/03/2015   HCT 46.7 (H) 11/03/2015   MCV 90.0 11/03/2015   PLT 235 11/03/2015   Lab Results   Component Value Date   NA 138 07/08/2016   K 4.3 07/08/2016   CO2 29 07/08/2016   GLUCOSE 110 (H) 07/08/2016   BUN 22 07/08/2016   CREATININE 1.24 (H) 07/08/2016   BILITOT 0.6 07/08/2016   ALKPHOS 72 07/08/2016   AST 18 07/08/2016   ALT 11 07/08/2016   PROT 7.5 07/08/2016   ALBUMIN 3.6 07/08/2016   CALCIUM 9.8 07/08/2016   ANIONGAP 9 11/03/2015   GFR 43.71 (L) 07/08/2016   Lab Results  Component Value Date   CHOL 152 07/08/2016   Lab Results  Component Value Date   HDL 38.40 (L) 07/08/2016   Lab Results  Component Value Date   LDLCALC 88 07/08/2016   Lab Results  Component Value Date   TRIG 127.0 07/08/2016   Lab Results  Component Value Date   CHOLHDL 4 07/08/2016   Lab Results  Component Value Date   HGBA1C 5.5 06/24/2011         Assessment & Plan:   Problem List Items Addressed This Visit      Unprioritized   Essential hypertension    Well controlled, no changes to meds. Encouraged heart healthy diet such as the DASH diet and exercise as tolerated.       Relevant Orders   CBC with Differential/Platelet   Lipid panel   TSH   CBC with Differential/Platelet   POCT Urinalysis Dipstick (Automated)   Fatigue    Check labs       Relevant Orders   CBC with Differential/Platelet   Lipid panel   TSH   CBC with Differential/Platelet   POCT Urinalysis Dipstick (Automated)   Hyperlipidemia    Encouraged heart healthy diet, increase exercise, avoid trans fats, consider a krill oil cap daily      Open wnd of scalp - Primary  Scabbed over but not healing No infection  Td given Refer to wound care      Relevant Orders   Ambulatory referral to [facility]    Other Visit Diagnoses    Hyperlipidemia LDL goal <100       Relevant Orders   CBC with Differential/Platelet   Lipid panel   TSH   CBC with Differential/Platelet   POCT Urinalysis Dipstick (Automated)      I am having Deanna [name] maintain her docusate sodium, losartan,  fenofibrate, amLODipine, and nystatin cream.  Meds ordered this encounter  Medications  . nystatin cream (MYCOSTATIN)    Sig: Apply 372 application topically 2 (two) times daily.    Dispense:  120 g    Refill:  2    CMA served as scribe during this visit. History, Physical and Plan performed by medical provider. Documentation and orders reviewed and attested to.  Ann Held, DO

## 2017-05-12 NOTE — Assessment & Plan Note (Signed)
Well controlled, no changes to meds. Encouraged heart healthy diet such as the DASH diet and exercise as tolerated.  °

## 2017-05-12 NOTE — Assessment & Plan Note (Signed)
Encouraged heart healthy diet, increase exercise, avoid trans fats, consider a krill oil cap daily 

## 2017-05-12 NOTE — Assessment & Plan Note (Signed)
Check labs 

## 2017-05-12 NOTE — Patient Instructions (Signed)

## 2017-05-15 ENCOUNTER — Telehealth: Payer: Self-pay | Admitting: *Deleted

## 2017-05-15 DIAGNOSIS — N390 Urinary tract infection, site not specified: Secondary | ICD-10-CM

## 2017-05-15 NOTE — Telephone Encounter (Signed)
-----   Message from Ann Held, DO sent at 05/15/2017  4:49 PM EST ----- Regarding: RE: POCT cipro 250 mg bid x 3 days --- recheck urine 2 weeks ---lab visit is fine ----- Message ----- From: Caffie Pinto Sent: 05/12/2017  11:55 AM To: Ann Held, DO, # Subject: POCT                                           Patient left a urine sample that is Positive for Leuk's and Nitrate's but there is not enough for culture.

## 2017-05-16 MED ORDER — CIPROFLOXACIN HCL 250 MG PO TABS: 250.0000 mg | ORAL_TABLET | Freq: Two times a day (BID) | ORAL | 0 refills | Status: DC

## 2017-05-16 NOTE — Telephone Encounter (Signed)
Daughter notified and rx sent in.  Future orders placed for urine dip

## 2017-05-19 ENCOUNTER — Other Ambulatory Visit: Payer: Self-pay | Admitting: *Deleted

## 2017-05-19 DIAGNOSIS — E039 Hypothyroidism, unspecified: Secondary | ICD-10-CM

## 2017-05-23 ENCOUNTER — Telehealth: Payer: Self-pay | Admitting: *Deleted

## 2017-05-23 NOTE — Telephone Encounter (Signed)
Received Physician Orders in form of signature on Wound Care note from [facility]; forwarded to provider/SLS 02/19

## 2017-06-01 ENCOUNTER — Other Ambulatory Visit (INDEPENDENT_AMBULATORY_CARE_PROVIDER_SITE_OTHER): Payer: Medicare Other

## 2017-06-01 DIAGNOSIS — N390 Urinary tract infection, site not specified: Secondary | ICD-10-CM

## 2017-06-01 DIAGNOSIS — E039 Hypothyroidism, unspecified: Secondary | ICD-10-CM

## 2017-06-01 LAB — TSH: TSH: 3.77 u[IU]/mL (ref 0.35–4.50)

## 2017-06-01 LAB — T3, FREE: T3, Free: 3.3 pg/mL (ref 2.3–4.2)

## 2017-06-01 LAB — T4, FREE: Free T4: 1.06 ng/dL (ref 0.60–1.60)

## 2017-06-08 ENCOUNTER — Other Ambulatory Visit: Payer: Medicare Other

## 2017-06-08 DIAGNOSIS — N390 Urinary tract infection, site not specified: Secondary | ICD-10-CM

## 2017-06-08 LAB — POC URINALSYSI DIPSTICK (AUTOMATED)
BILIRUBIN UA: NEGATIVE
Blood, UA: NEGATIVE
GLUCOSE UA: NEGATIVE
Ketones, UA: NEGATIVE
LEUKOCYTES UA: NEGATIVE
NITRITE UA: NEGATIVE
PH UA: 7 (ref 5.0–8.0)
Protein, UA: NEGATIVE
Spec Grav, UA: 1.015 (ref 1.010–1.025)
UROBILINOGEN UA: 1 U/dL

## 2017-06-08 NOTE — Progress Notes (Unsigned)
Neg Neg Neg

## 2017-06-09 LAB — URINE CULTURE
MICRO NUMBER:: 90294145
SPECIMEN QUALITY:: ADEQUATE

## 2017-06-20 ENCOUNTER — Other Ambulatory Visit: Payer: Self-pay | Admitting: Family Medicine

## 2017-08-01 ENCOUNTER — Other Ambulatory Visit: Payer: Self-pay | Admitting: Family Medicine

## 2017-08-03 ENCOUNTER — Encounter: Payer: Self-pay | Admitting: Family Medicine

## 2017-08-04 NOTE — Telephone Encounter (Signed)
Dr Wendling -- Please advise in PCP's absence? 

## 2017-08-07 ENCOUNTER — Other Ambulatory Visit: Payer: Self-pay | Admitting: Family Medicine

## 2017-08-07 DIAGNOSIS — R109 Unspecified abdominal pain: Secondary | ICD-10-CM

## 2017-08-07 NOTE — Telephone Encounter (Signed)
Ok to check urine -- -she will need sterile cup

## 2017-08-07 NOTE — Telephone Encounter (Signed)
Are you ok with this? 

## 2017-08-11 ENCOUNTER — Other Ambulatory Visit (INDEPENDENT_AMBULATORY_CARE_PROVIDER_SITE_OTHER): Payer: Medicare Other

## 2017-08-11 DIAGNOSIS — R8290 Unspecified abnormal findings in urine: Secondary | ICD-10-CM

## 2017-08-11 DIAGNOSIS — R109 Unspecified abdominal pain: Secondary | ICD-10-CM | POA: Diagnosis not present

## 2017-08-11 DIAGNOSIS — R82998 Other abnormal findings in urine: Secondary | ICD-10-CM

## 2017-08-11 DIAGNOSIS — R319 Hematuria, unspecified: Secondary | ICD-10-CM

## 2017-08-11 LAB — POC URINALSYSI DIPSTICK (AUTOMATED)
Bilirubin, UA: NEGATIVE
GLUCOSE UA: NEGATIVE
Ketones, UA: NEGATIVE
NITRITE UA: NEGATIVE
SPEC GRAV UA: 1.015 (ref 1.010–1.025)
UROBILINOGEN UA: 1 U/dL
pH, UA: 7 (ref 5.0–8.0)

## 2017-08-12 LAB — URINE CULTURE
MICRO NUMBER: 90573107
SPECIMEN QUALITY: ADEQUATE

## 2017-09-15 ENCOUNTER — Telehealth: Payer: Self-pay | Admitting: *Deleted

## 2017-09-15 NOTE — Telephone Encounter (Signed)
Received Physician Orders from [facility]; forwarded to provider/SLS 06/14

## 2017-09-21 ENCOUNTER — Telehealth: Payer: Self-pay | Admitting: *Deleted

## 2017-09-21 NOTE — Telephone Encounter (Signed)
Received Physician Orders from [facility]; forwarded to provider/SLS 06/20

## 2017-09-26 ENCOUNTER — Ambulatory Visit (INDEPENDENT_AMBULATORY_CARE_PROVIDER_SITE_OTHER): Payer: Medicare Other | Admitting: Family Medicine

## 2017-09-26 ENCOUNTER — Ambulatory Visit (HOSPITAL_BASED_OUTPATIENT_CLINIC_OR_DEPARTMENT_OTHER)
Admission: RE | Admit: 2017-09-26 | Discharge: 2017-09-26 | Disposition: A | Discharge status: Home / Self Care | Payer: Medicare Other | Source: Ambulatory Visit | Attending: Family Medicine | Admitting: Family Medicine

## 2017-09-26 ENCOUNTER — Encounter: Payer: Self-pay | Admitting: Family Medicine

## 2017-09-26 VITALS — BP 128/72 | HR 63 | Temp 97.8°F | Resp 16 | Wt 220.2 lb

## 2017-09-26 DIAGNOSIS — R82998 Other abnormal findings in urine: Secondary | ICD-10-CM

## 2017-09-26 DIAGNOSIS — R8290 Unspecified abnormal findings in urine: Secondary | ICD-10-CM

## 2017-09-26 DIAGNOSIS — I10 Essential (primary) hypertension: Secondary | ICD-10-CM | POA: Diagnosis not present

## 2017-09-26 DIAGNOSIS — I6782 Cerebral ischemia: Secondary | ICD-10-CM | POA: Diagnosis not present

## 2017-09-26 DIAGNOSIS — I6200 Nontraumatic subdural hemorrhage, unspecified: Secondary | ICD-10-CM | POA: Diagnosis not present

## 2017-09-26 DIAGNOSIS — R809 Proteinuria, unspecified: Secondary | ICD-10-CM | POA: Diagnosis not present

## 2017-09-26 DIAGNOSIS — R4182 Altered mental status, unspecified: Secondary | ICD-10-CM | POA: Diagnosis present

## 2017-09-26 DIAGNOSIS — R319 Hematuria, unspecified: Secondary | ICD-10-CM

## 2017-09-26 DIAGNOSIS — E785 Hyperlipidemia, unspecified: Secondary | ICD-10-CM | POA: Diagnosis not present

## 2017-09-26 DIAGNOSIS — G319 Degenerative disease of nervous system, unspecified: Secondary | ICD-10-CM | POA: Insufficient documentation

## 2017-09-26 LAB — COMPREHENSIVE METABOLIC PANEL
ALBUMIN: 3.5 g/dL (ref 3.5–5.2)
ALT: 10 U/L (ref 0–35)
AST: 17 U/L (ref 0–37)
Alkaline Phosphatase: 67 U/L (ref 39–117)
BUN: 31 mg/dL — AB (ref 6–23)
CALCIUM: 9.6 mg/dL (ref 8.4–10.5)
CHLORIDE: 100 meq/L (ref 96–112)
CO2: 26 mEq/L (ref 19–32)
Creatinine, Ser: 1.19 mg/dL (ref 0.40–1.20)
GFR: 45.71 mL/min — ABNORMAL LOW (ref >60.00)
Glucose, Bld: 94 mg/dL (ref 70–99)
POTASSIUM: 4.5 meq/L (ref 3.5–5.1)
SODIUM: 135 meq/L (ref 135–145)
Total Bilirubin: 0.9 mg/dL (ref 0.2–1.2)
Total Protein: 7 g/dL (ref 6.0–8.3)

## 2017-09-26 LAB — LIPID PANEL
CHOLESTEROL: 165 mg/dL (ref 0–200)
HDL: 26.2 mg/dL — ABNORMAL LOW (ref >39.00)
NonHDL: 138.88
TRIGLYCERIDES: 218 mg/dL — AB (ref 0.0–149.0)
Total CHOL/HDL Ratio: 6
VLDL: 43.6 mg/dL — AB (ref 0.0–40.0)

## 2017-09-26 LAB — CBC WITH DIFFERENTIAL/PLATELET
BASOS PCT: 0.4 % (ref 0.0–3.0)
Basophils Absolute: 0 10*3/uL (ref 0.0–0.1)
EOS ABS: 0 10*3/uL (ref 0.0–0.7)
Eosinophils Relative: 0 % (ref 0.0–5.0)
HCT: 40.6 % (ref 36.0–46.0)
Hemoglobin: 13.5 g/dL (ref 12.0–15.0)
LYMPHS ABS: 1.6 10*3/uL (ref 0.7–4.0)
LYMPHS PCT: 24.9 % (ref 12.0–46.0)
MCHC: 33.4 g/dL (ref 30.0–36.0)
MCV: 88.1 fl (ref 78.0–100.0)
MONO ABS: 0.5 10*3/uL (ref 0.1–1.0)
Monocytes Relative: 7.4 % (ref 3.0–12.0)
NEUTROS ABS: 4.3 10*3/uL (ref 1.4–7.7)
NEUTROS PCT: 67.3 % (ref 43.0–77.0)
PLATELETS: 302 10*3/uL (ref 150.0–400.0)
RBC: 4.6 Mil/uL (ref 3.87–5.11)
RDW: 14.8 % (ref 11.5–15.5)
WBC: 6.4 10*3/uL (ref 4.0–10.5)

## 2017-09-26 LAB — POC URINALSYSI DIPSTICK (AUTOMATED)
BILIRUBIN UA: NEGATIVE
GLUCOSE UA: NEGATIVE
Ketones, UA: NEGATIVE
NITRITE UA: NEGATIVE
Protein, UA: POSITIVE — AB
Spec Grav, UA: 1.025 (ref 1.010–1.025)
Urobilinogen, UA: 1 E.U./dL
pH, UA: 6 (ref 5.0–8.0)

## 2017-09-26 LAB — LDL CHOLESTEROL, DIRECT: Direct LDL: 97 mg/dL

## 2017-09-26 LAB — TSH: TSH: 3.97 u[IU]/mL (ref 0.35–4.50)

## 2017-09-26 MED ORDER — NONFORMULARY OR COMPOUNDED ITEM: 1 refills | Status: DC

## 2017-09-26 MED ORDER — NONFORMULARY OR COMPOUNDED ITEM: 0 refills | Status: DC

## 2017-09-26 NOTE — Progress Notes (Signed)
Subjective:  I acted as a Education administrator for [employer]. Yancey Flemings, [place]   Patient ID: Deanna [name], female    DOB: 1930/03/18, 84 y.o.   MRN: [id number]  Chief Complaint  Patient presents with  . Numbness    lips left side since last week  . Altered Mental Status    HPI  Patient is in today for numbness in lips, and confusion.  She had an accident with her bowels in her sleep but woke up confused yesterday.  She is back to baseline today.  They did find aleve pm in her night stand with 8 pills missing.  They are concerned about TIA, UTI.   She is unable to give a urine specimen here.    Patient Care Team: Carollee Herter, Alferd Apa, DO as PCP - General Irine Seal, MD as Attending Physician (Urology)   Past Medical History:  Diagnosis Date  . AAA (abdominal aortic aneurysm) [facility]) 2011   Dr Early repaired this  . Acute ischemic colitis ([place]) 03/06/2012  . Anemia    patient denies   . Arthritis    bilateral hands and knees  . Bleeding ulcer 1980s   H. Pylori  . Blood transfusion without reported diagnosis   . Cancer ([place])    hx of skin cancer on face   . Cataract    In the past  . Chronic urinary tract infection   . Cirrhosis, biliary ([place])    liver bx 01/2008  . Diastolic CHF ([place]) 10/14/4578  . Epistaxis   . Fatigue   . GERD (gastroesophageal reflux disease)   . HLD (hyperlipidemia)   . HTN (hypertension)   . Hypertension   . Leg pain    bilateral  . OSA (obstructive sleep apnea)    mild on ss of 12/2008  . Pneumonia    hx of years ago   . Short-term memory loss    related to trauma of 08/2015   . Subdural hematoma ([place]) 08/2015  . Thoracic aortic aneurysm [facility])     Past Surgical History:  Procedure Laterality Date  . ABDOMINAL AORTIC ANEURYSM REPAIR  02/01/2010  . BREAST BIOPSY    . CATARACT EXTRACTION    . COLONOSCOPY  03/08/2012   Procedure: COLONOSCOPY;  Surgeon: Gatha Mayer, MD;  Location: [place];  Service: Endoscopy;  Laterality: N/A;  . CRANIOTOMY       for evacuation of subdural hematoma - 08/29/15   . CYSTOSCOPY WITH HOLMIUM LASER LITHOTRIPSY Right 11/05/2015   Procedure: CYSTOSCOPY WITH HOLMIUM LASER LITHOTRIPSY;  Surgeon: Irine Seal, MD;  [facility];  Service: Urology;  Laterality: Right;  . CYSTOSCOPY WITH URETEROSCOPY, STONE BASKETRY AND STENT PLACEMENT Right 11/05/2015   Procedure: CYSTOSCOPY WITH URETEROSCOPY, STONE BASKETRY AND STENT EXCHANGE;  Surgeon: Irine Seal, MD;  [facility];  Service: Urology;  Laterality: Right;  . ERCP N/A 04/20/2013   Procedure: ENDOSCOPIC RETROGRADE CHOLANGIOPANCREATOGRAPHY (ERCP);  Surgeon: Ladene Artist, MD;  [facility];  Service: Endoscopy;  Laterality: N/A;  . ILIAC ARTERY - FEMORAL ARTERY BYPASS GRAFT  2011   Dr Early    Family History  Problem Relation Age of Onset  . Cancer Brother        prostate    Social History   Socioeconomic History  . Marital status: Widowed    Spouse name: Not on file  . Number of children: Not on file  . Years of education: Not on file  . Highest education level: Not on  file  Occupational History  . Not on file  Social Needs  . Financial resource strain: Not on file  . Food insecurity:    Worry: Not on file    Inability: Not on file  . Transportation needs:    Medical: Not on file    Non-medical: Not on file  Tobacco Use  . Smoking status: Former Smoker    Years: 52.00    Types: Cigarettes    Last attempt to quit: 02/01/2010    Years since quitting: 7.6  . Smokeless tobacco: Never Used  Substance and Sexual Activity  . Alcohol use: No    Alcohol/week: 0.0 oz  . Drug use: No  . Sexual activity: Not Currently  Lifestyle  . Physical activity:    Days per week: Not on file    Minutes per session: Not on file  . Stress: Not on file  Relationships  . Social connections:    Talks on phone: Not on file    Gets together: Not on file    Attends religious service: Not on file    Active member of club or organization: Not on file     Attends meetings of clubs or organizations: Not on file    Relationship status: Not on file  . Intimate partner violence:    Fear of current or ex partner: Not on file    Emotionally abused: Not on file    Physically abused: Not on file    Forced sexual activity: Not on file  Other Topics Concern  . Not on file  Social History Narrative  . Not on file    Outpatient Medications Prior to Visit  Medication Sig Dispense Refill  . amLODipine (NORVASC) 5 MG tablet TAKE 1 TABLET BY MOUTH EVERY DAY 90 tablet 0  . ciprofloxacin (CIPRO) 250 MG tablet Take 1 tablet (250 mg total) by mouth 2 (two) times daily. 6 tablet 0  . docusate sodium (COLACE) 100 MG capsule Take 100 mg by mouth daily.    . fenofibrate 160 MG tablet TAKE 1 TABLET BY MOUTH DAILY 90 tablet 0  . fenofibrate 160 MG tablet TAKE 1 TABLET (160 MG TOTAL) BY MOUTH DAILY. *FAXED FOR 90 DAY SUPPLY* 30 tablet 2  . losartan (COZAAR) 100 MG tablet Take 1 tablet (100 mg total) by mouth daily. 90 tablet 3  . nystatin cream (MYCOSTATIN) Apply 923 application topically 2 (two) times daily. 120 g 2   No facility-administered medications prior to visit.     Allergies  Allergen Reactions  . Keppra [Levetiracetam] Shortness Of Breath  . Crestor [Rosuvastatin] Other (See Comments)    Muscle weakness  . Ezetimibe Other (See Comments)    Muscle weakness    Review of Systems  Constitutional: Negative for chills, fever and malaise/fatigue.  HENT: Negative for congestion and hearing loss.   Eyes: Negative for discharge.  Respiratory: Negative for cough, sputum production and shortness of breath.   Cardiovascular: Negative for chest pain, palpitations and leg swelling.  Gastrointestinal: Negative for abdominal pain, blood in stool, constipation, diarrhea, heartburn, nausea and vomiting.  Genitourinary: Negative for dysuria, frequency, hematuria and urgency.  Musculoskeletal: Negative for back pain, falls and myalgias.  Skin: Negative for  rash.  Neurological: Negative for dizziness, sensory change, loss of consciousness, weakness and headaches.  Endo/Heme/Allergies: Negative for environmental allergies. Does not bruise/bleed easily.  Psychiatric/Behavioral: Positive for memory loss. Negative for depression and suicidal ideas. The patient is not nervous/anxious and does not have insomnia.  Objective:    Physical Exam  Constitutional: She appears well-developed and well-nourished.  HENT:  + scar on scalp with scab -- pt in wound care   Eyes: Conjunctivae and EOM are normal.  Neck: Normal range of motion. Neck supple. No JVD present. Carotid bruit is not present. No thyromegaly present.  Cardiovascular: Normal rate, regular rhythm and normal heart sounds.  No murmur heard. Pulmonary/Chest: Effort normal and breath sounds normal. No respiratory distress. She has no wheezes. She has no rales. She exhibits no tenderness.  Musculoskeletal: She exhibits no edema.  Neurological: She is alert.  Pt with some memory loss-- no change Her mental status today is back to baseline   Psychiatric: She has a normal mood and affect.  Nursing note and vitals reviewed.   BP 128/72 (BP Location: Left Arm, Patient Position: Sitting, Cuff Size: Large)   Pulse 63   Temp 97.8 F (36.6 C) (Oral)   Resp 16   Wt 220 lb 3.2 oz (99.9 kg)   SpO2 96%   BMI 33.37 kg/m  Wt Readings from Last 3 Encounters:  09/26/17 220 lb 3.2 oz (99.9 kg)  05/12/17 217 lb (98.4 kg)  02/21/17 210 lb (95.3 kg)   BP Readings from Last 3 Encounters:  09/26/17 128/72  05/12/17 128/82  02/22/17 (!) 147/86     Immunization History  Administered Date(s) Administered  . Hepatitis B 01/08/2010  . Influenza Whole 02/21/2007, 12/20/2007, 12/11/2009, 11/30/2010, 12/19/2012  . Influenza, High Dose Seasonal PF 12/14/2013, 12/31/2015  . Influenza-Unspecified 12/04/2014  . Pneumococcal Conjugate-13 06/25/2015  . Pneumococcal Polysaccharide-23 04/04/2004,  04/20/2005  . Td 04/04/2004    Health Maintenance  Topic Date Due  . TETANUS/TDAP  04/04/2014  . INFLUENZA VACCINE  11/02/2017  . DEXA SCAN  Completed  . PNA vac Low Risk Adult  Completed    Lab Results  Component Value Date   WBC 7.6 05/12/2017   HGB 14.1 05/12/2017   HCT 43.4 05/12/2017   PLT 271 05/12/2017   GLUCOSE 110 (H) 07/08/2016   CHOL 143 05/12/2017   TRIG 140.0 05/12/2017   HDL 33.40 (L) 05/12/2017   LDLDIRECT 140.0 12/31/2015   LDLCALC 82 05/12/2017   ALT 11 07/08/2016   AST 18 07/08/2016   NA 138 07/08/2016   K 4.3 07/08/2016   CL 102 07/08/2016   CREATININE 1.24 (H) 07/08/2016   BUN 22 07/08/2016   CO2 29 07/08/2016   TSH 3.77 06/01/2017   INR 1.07 11/03/2015   HGBA1C 5.5 06/24/2011    Lab Results  Component Value Date   TSH 3.77 06/01/2017   Lab Results  Component Value Date   WBC 7.6 05/12/2017   HGB 14.1 05/12/2017   HCT 43.4 05/12/2017   MCV 86.6 05/12/2017   PLT 271 05/12/2017   Lab Results  Component Value Date   NA 138 07/08/2016   K 4.3 07/08/2016   CO2 29 07/08/2016   GLUCOSE 110 (H) 07/08/2016   BUN 22 07/08/2016   CREATININE 1.24 (H) 07/08/2016   BILITOT 0.6 07/08/2016   ALKPHOS 72 07/08/2016   AST 18 07/08/2016   ALT 11 07/08/2016   PROT 7.5 07/08/2016   ALBUMIN 3.6 07/08/2016   CALCIUM 9.8 07/08/2016   ANIONGAP 9 11/03/2015   GFR 43.71 (L) 07/08/2016   Lab Results  Component Value Date   CHOL 143 05/12/2017   Lab Results  Component Value Date   HDL 33.40 (L) 05/12/2017   Lab Results  Component Value Date  [place] 82 05/12/2017   Lab Results  Component Value Date   TRIG 140.0 05/12/2017   Lab Results  Component Value Date   CHOLHDL 4 05/12/2017   Lab Results  Component Value Date   HGBA1C 5.5 06/24/2011         Assessment & Plan:   Problem List Items Addressed This Visit      Unprioritized   Acute alteration in mental status - Primary    Ct scan today Check labs Check urine r/o uti        Relevant Medications   NONFORMULARY OR COMPOUNDED ITEM   Other Relevant Orders   CT Head Wo Contrast   POCT Urinalysis Dipstick (Automated)   CBC with Differential/Platelet   TSH   Comprehensive metabolic panel   Essential hypertension    Well controlled, no changes to meds. Encouraged heart healthy diet such as the DASH diet and exercise as tolerated.       Hyperlipidemia    Tolerating statin, encouraged heart healthy diet, avoid trans fats, minimize simple carbs and saturated fats. Increase exercise as tolerated      Hyperlipidemia LDL goal <100   Relevant Orders   Lipid panel   Subdural hemorrhage (HCC)   Relevant Medications   NONFORMULARY OR COMPOUNDED ITEM      I am having Deanna [name] start on NONFORMULARY OR COMPOUNDED ITEM and NONFORMULARY OR COMPOUNDED ITEM. I am also having her maintain her docusate sodium, losartan, fenofibrate, nystatin cream, ciprofloxacin, fenofibrate, and amLODipine.  Meds ordered this encounter  Medications  . NONFORMULARY OR COMPOUNDED ITEM    Sig: Hospital bed Dx dementia , hx subdural hemorrhage    Dispense:  1 each    Refill:  0  . NONFORMULARY OR COMPOUNDED ITEM    Sig: In and out cath  # 1   Dx mental status change, incontinence of urine    Dispense:  1 each    Refill:  1   Pt , daughter and caretaker here for >30 min with > 50% face to face to discuss mental status change  CMA served as scribe during this visit. History, Physical and Plan performed by medical provider. Documentation and orders reviewed and attested to.   Ann Held, DO

## 2017-09-26 NOTE — Assessment & Plan Note (Signed)
Ct scan today Check labs Check urine r/o uti

## 2017-09-26 NOTE — Patient Instructions (Signed)

## 2017-09-26 NOTE — Assessment & Plan Note (Signed)
Well controlled, no changes to meds. Encouraged heart healthy diet such as the DASH diet and exercise as tolerated.  °

## 2017-09-26 NOTE — Addendum Note (Signed)
Addended by: Harl Bowie on: 09/26/2017 05:10 PM   Modules accepted: Orders

## 2017-09-26 NOTE — Assessment & Plan Note (Signed)
Tolerating statin, encouraged heart healthy diet, avoid trans fats, minimize simple carbs and saturated fats. Increase exercise as tolerated 

## 2017-09-28 LAB — URINE CULTURE
MICRO NUMBER: 90757504
SPECIMEN QUALITY: ADEQUATE

## 2017-09-29 ENCOUNTER — Other Ambulatory Visit: Payer: Self-pay | Admitting: Family Medicine

## 2017-09-29 ENCOUNTER — Telehealth: Payer: Self-pay | Admitting: *Deleted

## 2017-09-29 DIAGNOSIS — I10 Essential (primary) hypertension: Secondary | ICD-10-CM

## 2017-09-29 NOTE — Telephone Encounter (Signed)
Received Physician Orders/DME from [facility]; forwarded to provider/SLS 06/28

## 2017-10-02 MED ORDER — CIPROFLOXACIN HCL 250 MG PO TABS: 250.0000 mg | ORAL_TABLET | Freq: Two times a day (BID) | ORAL | 0 refills | Status: DC

## 2017-11-01 ENCOUNTER — Other Ambulatory Visit: Payer: Self-pay | Admitting: Family Medicine

## 2017-11-01 DIAGNOSIS — I10 Essential (primary) hypertension: Secondary | ICD-10-CM

## 2018-01-09 ENCOUNTER — Other Ambulatory Visit: Payer: Self-pay | Admitting: Family Medicine

## 2018-01-24 ENCOUNTER — Other Ambulatory Visit: Payer: Self-pay | Admitting: Family Medicine

## 2018-01-24 DIAGNOSIS — I10 Essential (primary) hypertension: Secondary | ICD-10-CM

## 2018-03-06 ENCOUNTER — Other Ambulatory Visit: Payer: Self-pay | Admitting: Family Medicine

## 2018-04-12 ENCOUNTER — Other Ambulatory Visit: Payer: Self-pay | Admitting: Family Medicine

## 2018-04-28 ENCOUNTER — Other Ambulatory Visit: Payer: Self-pay | Admitting: Family Medicine

## 2018-04-28 DIAGNOSIS — I10 Essential (primary) hypertension: Secondary | ICD-10-CM

## 2018-06-12 ENCOUNTER — Encounter: Payer: Self-pay | Admitting: Family Medicine

## 2018-06-12 ENCOUNTER — Other Ambulatory Visit: Payer: Self-pay | Admitting: *Deleted

## 2018-06-12 ENCOUNTER — Ambulatory Visit (HOSPITAL_BASED_OUTPATIENT_CLINIC_OR_DEPARTMENT_OTHER)
Admission: RE | Admit: 2018-06-12 | Discharge: 2018-06-12 | Disposition: A | Discharge status: Home / Self Care | Payer: Medicare Other | Source: Ambulatory Visit | Attending: Family Medicine | Admitting: Family Medicine

## 2018-06-12 ENCOUNTER — Ambulatory Visit (INDEPENDENT_AMBULATORY_CARE_PROVIDER_SITE_OTHER): Payer: Medicare Other | Admitting: Family Medicine

## 2018-06-12 ENCOUNTER — Other Ambulatory Visit: Payer: Self-pay | Admitting: Family Medicine

## 2018-06-12 VITALS — BP 126/82 | HR 84 | Temp 98.0°F | Ht 68.11 in | Wt 222.0 lb

## 2018-06-12 DIAGNOSIS — R059 Cough, unspecified: Secondary | ICD-10-CM

## 2018-06-12 DIAGNOSIS — J40 Bronchitis, not specified as acute or chronic: Secondary | ICD-10-CM | POA: Diagnosis not present

## 2018-06-12 DIAGNOSIS — R05 Cough: Secondary | ICD-10-CM

## 2018-06-12 LAB — CBC WITH DIFFERENTIAL/PLATELET
Basophils Absolute: 0 10*3/uL (ref 0.0–0.1)
Basophils Relative: 0.4 % (ref 0.0–3.0)
EOS PCT: 0 % (ref 0.0–5.0)
Eosinophils Absolute: 0 10*3/uL (ref 0.0–0.7)
HCT: 42.9 % (ref 36.0–46.0)
Hemoglobin: 13.9 g/dL (ref 12.0–15.0)
LYMPHS ABS: 1.9 10*3/uL (ref 0.7–4.0)
Lymphocytes Relative: 24.9 % (ref 12.0–46.0)
MCHC: 32.3 g/dL (ref 30.0–36.0)
MCV: 89.2 fl (ref 78.0–100.0)
MONO ABS: 0.6 10*3/uL (ref 0.1–1.0)
MONOS PCT: 7.4 % (ref 3.0–12.0)
NEUTROS ABS: 5.2 10*3/uL (ref 1.4–7.7)
NEUTROS PCT: 67.3 % (ref 43.0–77.0)
Platelets: 301 10*3/uL (ref 150.0–400.0)
RBC: 4.81 Mil/uL (ref 3.87–5.11)
RDW: 15.2 % (ref 11.5–15.5)
WBC: 7.7 10*3/uL (ref 4.0–10.5)

## 2018-06-12 LAB — COMPREHENSIVE METABOLIC PANEL
ALT: 10 U/L (ref 0–35)
AST: 14 U/L (ref 0–37)
Albumin: 3.5 g/dL (ref 3.5–5.2)
Alkaline Phosphatase: 63 U/L (ref 39–117)
BUN: 20 mg/dL (ref 6–23)
CHLORIDE: 103 meq/L (ref 96–112)
CO2: 28 mEq/L (ref 19–32)
Calcium: 9.8 mg/dL (ref 8.4–10.5)
Creatinine, Ser: 0.92 mg/dL (ref 0.40–1.20)
GFR: 57.77 mL/min — AB (ref >60.00)
GLUCOSE: 108 mg/dL — AB (ref 70–99)
POTASSIUM: 4.5 meq/L (ref 3.5–5.1)
SODIUM: 140 meq/L (ref 135–145)
Total Bilirubin: 1 mg/dL (ref 0.2–1.2)
Total Protein: 7 g/dL (ref 6.0–8.3)

## 2018-06-12 MED ORDER — BENZONATATE 100 MG PO CAPS: 100.0000 mg | ORAL_CAPSULE | Freq: Two times a day (BID) | ORAL | 0 refills | Status: DC | PRN

## 2018-06-12 MED ORDER — AZITHROMYCIN 250 MG PO TABS: ORAL_TABLET | ORAL | 0 refills | Status: DC

## 2018-06-12 MED ORDER — BENZONATATE 200 MG PO CAPS: 200.0000 mg | ORAL_CAPSULE | Freq: Two times a day (BID) | ORAL | 0 refills | Status: DC | PRN

## 2018-06-12 NOTE — Assessment & Plan Note (Addendum)
Check labs, cxr R/o aspiration pneumonia--- pt choked on meds last Thursday  zithromax and tessalon perles  rto 2 weeks or sooner prn

## 2018-06-12 NOTE — Patient Instructions (Signed)

## 2018-06-12 NOTE — Telephone Encounter (Signed)
Ok to change to 200 mg

## 2018-06-12 NOTE — Progress Notes (Signed)
Patient ID: Deanna [name], female    DOB: 1930-01-27  Age: 85 y.o. MRN: [id number]    Subjective:  Subjective  HPI Deanna [name] presents for cough and congestion since Th[place]day  Review of Systems  Constitutional: Negative for appetite change, diaphoresis, fatigue and unexpected weight change.  Eyes: Negative for pain, redness and visual disturbance.  Respiratory: Positive for cough and wheezing. Negative for chest tightness and shortness of breath.   Cardiovascular: Negative for chest pain, palpitations and leg swelling.  Endocrine: Negative for cold intolerance, heat intolerance, polydipsia, polyphagia and polyuria.  Genitourinary: Negative for difficulty urinating, dysuria and frequency.  Neurological: Negative for dizziness, light-headedness, numbness and headaches.    History Past Medical History:  Diagnosis Date  . AAA (abdominal aortic aneurysm) [facility]) 2011   Dr Early repaired this  . Acute ischemic colitis ([place]) 03/06/2012  . Anemia    patient denies   . Arthritis    bilateral hands and knees  . Bleeding ulcer 1980s   H. Pylori  . Blood transfusion without reported diagnosis   . Cancer ([place])    hx of skin cancer on face   . Cataract    In the past  . Chronic urinary tract infection   . Cirrhosis, biliary ([place])    liver bx 01/2008  . Diastolic CHF ([place]) 3/55/7322  . Epistaxis   . Fatigue   . GERD (gastroesophageal reflux disease)   . HLD (hyperlipidemia)   . HTN (hypertension)   . Hypertension   . Leg pain    bilateral  . OSA (obstructive sleep apnea)    mild on ss of 12/2008  . Pneumonia    hx of years ago   . Short-term memory loss    related to trauma of 08/2015   . Subdural hematoma ([place]) 08/2015  . Thoracic aortic aneurysm [facility])     She has a past surgical history that includes Iliac artery - femoral artery bypass graft (2011); Cataract extraction; Breast biopsy; Colonoscopy (03/08/2012); Abdominal aortic aneurysm repair (02/01/2010); ERCP (N/A,  04/20/2013); Craniotomy; Cystoscopy with ureteroscopy, stone basketry and stent placement (Right, 11/05/2015); and Cystoscopy with holmium laser lithotripsy (Right, 11/05/2015).   Her family history includes Cancer in her brother.She reports that she quit smoking about 8 years ago. Her smoking use included cigarettes. She quit after 52.00 years of use. She has never used smokeless tobacco. She reports that she does not drink alcohol or use drugs.  Current Outpatient Medications on File Prior to Visit  Medication Sig Dispense Refill  . amLODipine (NORVASC) 5 MG tablet TAKE 1 TABLET BY MOUTH EVERY DAY 90 tablet 1  . docusate sodium (COLACE) 100 MG capsule Take 100 mg by mouth daily.    . fenofibrate 160 MG tablet TAKE 1 TABLET BY MOUTH EVERY DAY 90 tablet 0  . losartan (COZAAR) 100 MG tablet Take 1 tablet (100 mg total) by mouth daily. 90 tablet 3  . losartan (COZAAR) 100 MG tablet TAKE 1 TABLET BY MOUTH DAILY 90 tablet 3  . NONFORMULARY OR COMPOUNDED [facility] bed Dx dementia , hx subdural hemorrhage 1 each 0  . NONFORMULARY OR COMPOUNDED ITEM In and out cath  # 1   Dx mental status change, incontinence of urine 1 each 1  . nystatin cream (MYCOSTATIN) APPLY TOPICALLY 2 (TWO) TIMES DAILY 120 g 2   No current facility-administered medications on file prior to visit.      Objective:  Objective  Physical Exam Vitals signs and nursing note reviewed.  Constitutional:      Appearance: She is well-developed.  HENT:     Head: Normocephalic and atraumatic.  Eyes:     Conjunctiva/sclera: Conjunctivae normal.  Neck:     Musculoskeletal: Normal range of motion and neck supple.     Thyroid: No thyromegaly.     Vascular: No carotid bruit or JVD.  Cardiovascular:     Rate and Rhythm: Normal rate and regular rhythm.     Heart sounds: Normal heart sounds. No murmur.  Pulmonary:     Effort: Pulmonary effort is normal. No respiratory distress.     Breath sounds: Normal breath sounds. No wheezing or  rales.  Chest:     Chest wall: No tenderness.  Neurological:     Mental Status: She is alert and oriented to person, place, and time.    BP 126/82 (BP Location: Left Arm, Patient Position: Sitting, Cuff Size: Large)   Pulse 84   Temp 98 F (36.7 C)   Ht 5' 8.11" (1.73 m)   Wt 222 lb (100.7 kg)   SpO2 96%   BMI 33.65 kg/m  Wt Readings from Last 3 Encounters:  06/12/18 222 lb (100.7 kg)  09/26/17 220 lb 3.2 oz (99.9 kg)  05/12/17 217 lb (98.4 kg)     Lab Results  Component Value Date   WBC 6.4 09/26/2017   HGB 13.5 09/26/2017   HCT 40.6 09/26/2017   PLT 302.0 09/26/2017   GLUCOSE 94 09/26/2017   CHOL 165 09/26/2017   TRIG 218.0 (H) 09/26/2017   HDL 26.20 (L) 09/26/2017   LDLDIRECT 97.0 09/26/2017   LDLCALC 82 05/12/2017   ALT 10 09/26/2017   AST 17 09/26/2017   NA 135 09/26/2017   K 4.5 09/26/2017   CL 100 09/26/2017   CREATININE 1.19 09/26/2017   BUN 31 (H) 09/26/2017   CO2 26 09/26/2017   TSH 3.97 09/26/2017   INR 1.07 11/03/2015   HGBA1C 5.5 06/24/2011    Ct Head Wo Contrast  Result Date: 09/26/2017 CLINICAL DATA:  Acute alteration in mental status EXAM: CT HEAD WITHOUT CONTRAST TECHNIQUE: Contiguous axial images were obtained from the base of the skull through the vertex without intravenous contrast. COMPARISON:  CT 02/21/2017 FINDINGS: Brain: Moderate atrophy and moderate chronic microvascular ischemic change throughout the white matter unchanged. No acute infarct. Negative for acute hemorrhage or mass. No midline shift. Vascular: Negative for hyperdense vessel Skull: Right frontal craniotomy for subdural. No acute skeletal abnormality Sinuses/Orbits: Paranasal sinuses clear. Bilateral cataract surgery. Other: None IMPRESSION: No acute intracranial abnormality. Atrophy and chronic microvascular ischemia unchanged. Electronically Signed   By: Franchot Gallo M.D.   On: 09/26/2017 13:20     Assessment & Plan:  Plan  I have discontinued Deanna [name]'s  ciprofloxacin and ciprofloxacin. I am also having her start on benzonatate and azithromycin. Additionally, I am having her maintain her docusate sodium, losartan, NONFORMULARY OR COMPOUNDED ITEM, NONFORMULARY OR COMPOUNDED ITEM, losartan, nystatin cream, fenofibrate, and amLODipine.  Meds ordered this encounter  Medications  . benzonatate (TESSALON) 100 MG capsule    Sig: Take 1 capsule (100 mg total) by mouth 2 (two) times daily as needed for cough.    Dispense:  20 capsule    Refill:  0  . azithromycin (ZITHROMAX Z-PAK) 250 MG tablet    Sig: As directed    Dispense:  6 each    Refill:  0    Problem List Items Addressed This Visit      Unprioritized  Bronchitis - Primary    Check labs, cxr R/o aspiration pneumonia--- pt choked on meds last Thursday  zithromax and tessalon perles  rto 2 weeks or sooner prn       Relevant Medications   azithromycin (ZITHROMAX Z-PAK) 250 MG tablet    Other Visit Diagnoses    Cough       Relevant Medications   benzonatate (TESSALON) 100 MG capsule   Other Relevant Orders   DG Chest 2 View   CBC with Differential/Platelet   Comprehensive metabolic panel      Follow-up: Return in about 2 weeks (around 06/26/2018), or if symptoms worsen or fail to improve.  Ann Held, DO

## 2018-06-12 NOTE — Telephone Encounter (Signed)
100mg  is on back order.  200mg  is available.

## 2018-08-13 ENCOUNTER — Other Ambulatory Visit: Payer: Self-pay | Admitting: Family Medicine

## 2018-08-13 MED ORDER — FENOFIBRATE 160 MG PO TABS: 160.0000 mg | ORAL_TABLET | Freq: Every day | ORAL | 1 refills | Status: DC

## 2018-09-26 ENCOUNTER — Other Ambulatory Visit: Payer: Self-pay | Admitting: Family Medicine

## 2018-09-26 DIAGNOSIS — I10 Essential (primary) hypertension: Secondary | ICD-10-CM

## 2018-10-02 ENCOUNTER — Other Ambulatory Visit: Payer: Self-pay | Admitting: Family Medicine

## 2018-10-02 ENCOUNTER — Other Ambulatory Visit: Payer: Self-pay

## 2018-10-02 DIAGNOSIS — I10 Essential (primary) hypertension: Secondary | ICD-10-CM

## 2018-10-02 MED ORDER — VALSARTAN 80 MG PO TABS: 80.0000 mg | ORAL_TABLET | Freq: Every day | ORAL | 2 refills | Status: DC

## 2018-10-02 NOTE — Telephone Encounter (Signed)
Change to diovan 80 mg 1 po qd #30  2 refills  Check bp and call us in 10-14 days with readings Or we can do nurse visit in 2 weeks

## 2018-10-02 NOTE — Telephone Encounter (Signed)
Medication on backorder. Please advise.

## 2018-10-02 NOTE — Telephone Encounter (Signed)
Spoke with patient. New Rx sent.

## 2018-10-03 ENCOUNTER — Other Ambulatory Visit: Payer: Self-pay | Admitting: Family Medicine

## 2018-10-03 DIAGNOSIS — I10 Essential (primary) hypertension: Secondary | ICD-10-CM

## 2018-10-08 ENCOUNTER — Encounter: Payer: Self-pay | Admitting: Family Medicine

## 2018-10-09 ENCOUNTER — Other Ambulatory Visit: Payer: Self-pay

## 2018-10-09 ENCOUNTER — Other Ambulatory Visit: Payer: Self-pay | Admitting: Family Medicine

## 2018-10-09 DIAGNOSIS — I10 Essential (primary) hypertension: Secondary | ICD-10-CM

## 2018-10-09 MED ORDER — LOSARTAN POTASSIUM 100 MG PO TABS: 100.0000 mg | ORAL_TABLET | Freq: Every day | ORAL | 1 refills | Status: DC

## 2018-10-12 ENCOUNTER — Other Ambulatory Visit: Payer: Self-pay | Admitting: Family Medicine

## 2018-10-30 ENCOUNTER — Other Ambulatory Visit: Payer: Self-pay | Admitting: *Deleted

## 2018-10-30 NOTE — Telephone Encounter (Signed)
Error

## 2018-11-05 ENCOUNTER — Other Ambulatory Visit: Payer: Self-pay

## 2018-11-06 ENCOUNTER — Encounter: Payer: Self-pay | Admitting: Family Medicine

## 2018-11-06 DIAGNOSIS — I10 Essential (primary) hypertension: Secondary | ICD-10-CM

## 2018-11-07 MED ORDER — LOSARTAN POTASSIUM 100 MG PO TABS: 100.0000 mg | ORAL_TABLET | Freq: Every day | ORAL | 1 refills | Status: DC

## 2018-11-29 ENCOUNTER — Other Ambulatory Visit: Payer: Self-pay | Admitting: Family Medicine

## 2019-01-06 ENCOUNTER — Other Ambulatory Visit: Payer: Self-pay | Admitting: Family Medicine

## 2019-01-27 ENCOUNTER — Other Ambulatory Visit: Payer: Self-pay | Admitting: Family Medicine

## 2019-03-21 ENCOUNTER — Other Ambulatory Visit: Payer: Self-pay | Admitting: Family Medicine

## 2019-03-21 DIAGNOSIS — I10 Essential (primary) hypertension: Secondary | ICD-10-CM

## 2019-03-21 NOTE — Telephone Encounter (Signed)
Last OV 06/12/18 Last refill 05/01/18 #90/1 Next OV not scheduled

## 2019-04-07 ENCOUNTER — Other Ambulatory Visit: Payer: Self-pay | Admitting: Family Medicine

## 2019-04-23 ENCOUNTER — Other Ambulatory Visit: Payer: Self-pay | Admitting: Family Medicine

## 2019-04-23 DIAGNOSIS — I10 Essential (primary) hypertension: Secondary | ICD-10-CM

## 2019-05-26 ENCOUNTER — Other Ambulatory Visit: Payer: Self-pay | Admitting: Family Medicine

## 2019-06-15 ENCOUNTER — Other Ambulatory Visit: Payer: Self-pay | Admitting: Family Medicine

## 2019-06-15 DIAGNOSIS — I10 Essential (primary) hypertension: Secondary | ICD-10-CM

## 2019-07-18 ENCOUNTER — Other Ambulatory Visit: Payer: Self-pay

## 2019-07-18 ENCOUNTER — Encounter (HOSPITAL_COMMUNITY): Payer: Self-pay | Admitting: Emergency Medicine

## 2019-07-18 ENCOUNTER — Encounter: Payer: Self-pay | Admitting: Family Medicine

## 2019-07-18 ENCOUNTER — Inpatient Hospital Stay (HOSPITAL_COMMUNITY)
Admission: EM | Admit: 2019-07-18 | Discharge: 2019-07-25 | DRG: 689 | Disposition: A | Discharge status: Skilled Nursing Facility | Payer: Medicare Other | Attending: Internal Medicine | Admitting: Internal Medicine

## 2019-07-18 ENCOUNTER — Ambulatory Visit (INDEPENDENT_AMBULATORY_CARE_PROVIDER_SITE_OTHER): Payer: Medicare Other | Admitting: Family Medicine

## 2019-07-18 ENCOUNTER — Emergency Department (HOSPITAL_COMMUNITY): Payer: Medicare Other

## 2019-07-18 VITALS — BP 112/60 | HR 88

## 2019-07-18 DIAGNOSIS — G4733 Obstructive sleep apnea (adult) (pediatric): Secondary | ICD-10-CM | POA: Diagnosis present

## 2019-07-18 DIAGNOSIS — J449 Chronic obstructive pulmonary disease, unspecified: Secondary | ICD-10-CM | POA: Diagnosis present

## 2019-07-18 DIAGNOSIS — I10 Essential (primary) hypertension: Secondary | ICD-10-CM | POA: Diagnosis present

## 2019-07-18 DIAGNOSIS — I5032 Chronic diastolic (congestive) heart failure: Secondary | ICD-10-CM | POA: Diagnosis present

## 2019-07-18 DIAGNOSIS — Z888 Allergy status to other drugs, medicaments and biological substances status: Secondary | ICD-10-CM

## 2019-07-18 DIAGNOSIS — Z20822 Contact with and (suspected) exposure to covid-19: Secondary | ICD-10-CM | POA: Diagnosis present

## 2019-07-18 DIAGNOSIS — Z66 Do not resuscitate: Secondary | ICD-10-CM | POA: Diagnosis present

## 2019-07-18 DIAGNOSIS — E860 Dehydration: Secondary | ICD-10-CM | POA: Diagnosis present

## 2019-07-18 DIAGNOSIS — R319 Hematuria, unspecified: Secondary | ICD-10-CM

## 2019-07-18 DIAGNOSIS — R8290 Unspecified abnormal findings in urine: Secondary | ICD-10-CM | POA: Insufficient documentation

## 2019-07-18 DIAGNOSIS — G9341 Metabolic encephalopathy: Secondary | ICD-10-CM | POA: Diagnosis present

## 2019-07-18 DIAGNOSIS — R05 Cough: Secondary | ICD-10-CM | POA: Diagnosis present

## 2019-07-18 DIAGNOSIS — N390 Urinary tract infection, site not specified: Secondary | ICD-10-CM | POA: Diagnosis present

## 2019-07-18 DIAGNOSIS — K219 Gastro-esophageal reflux disease without esophagitis: Secondary | ICD-10-CM | POA: Diagnosis present

## 2019-07-18 DIAGNOSIS — N3000 Acute cystitis without hematuria: Principal | ICD-10-CM | POA: Diagnosis present

## 2019-07-18 DIAGNOSIS — R0600 Dyspnea, unspecified: Secondary | ICD-10-CM

## 2019-07-18 DIAGNOSIS — Z79899 Other long term (current) drug therapy: Secondary | ICD-10-CM

## 2019-07-18 DIAGNOSIS — I712 Thoracic aortic aneurysm, without rupture, unspecified: Secondary | ICD-10-CM

## 2019-07-18 DIAGNOSIS — J0140 Acute pansinusitis, unspecified: Secondary | ICD-10-CM | POA: Insufficient documentation

## 2019-07-18 DIAGNOSIS — J690 Pneumonitis due to inhalation of food and vomit: Secondary | ICD-10-CM | POA: Diagnosis present

## 2019-07-18 DIAGNOSIS — R82998 Other abnormal findings in urine: Secondary | ICD-10-CM

## 2019-07-18 DIAGNOSIS — Z87820 Personal history of traumatic brain injury: Secondary | ICD-10-CM

## 2019-07-18 DIAGNOSIS — Y92009 Unspecified place in unspecified non-institutional (private) residence as the place of occurrence of the external cause: Secondary | ICD-10-CM

## 2019-07-18 DIAGNOSIS — F0390 Unspecified dementia without behavioral disturbance: Secondary | ICD-10-CM | POA: Diagnosis present

## 2019-07-18 DIAGNOSIS — I110 Hypertensive heart disease with heart failure: Secondary | ICD-10-CM | POA: Diagnosis present

## 2019-07-18 DIAGNOSIS — E785 Hyperlipidemia, unspecified: Secondary | ICD-10-CM | POA: Diagnosis present

## 2019-07-18 DIAGNOSIS — R059 Cough, unspecified: Secondary | ICD-10-CM | POA: Diagnosis present

## 2019-07-18 DIAGNOSIS — K5900 Constipation, unspecified: Secondary | ICD-10-CM | POA: Diagnosis present

## 2019-07-18 DIAGNOSIS — M25579 Pain in unspecified ankle and joints of unspecified foot: Secondary | ICD-10-CM

## 2019-07-18 DIAGNOSIS — K743 Primary biliary cirrhosis: Secondary | ICD-10-CM | POA: Diagnosis present

## 2019-07-18 DIAGNOSIS — R627 Adult failure to thrive: Secondary | ICD-10-CM | POA: Diagnosis present

## 2019-07-18 DIAGNOSIS — R200 Anesthesia of skin: Secondary | ICD-10-CM | POA: Diagnosis present

## 2019-07-18 DIAGNOSIS — R5381 Other malaise: Secondary | ICD-10-CM | POA: Diagnosis present

## 2019-07-18 DIAGNOSIS — D649 Anemia, unspecified: Secondary | ICD-10-CM | POA: Diagnosis present

## 2019-07-18 DIAGNOSIS — Z8042 Family history of malignant neoplasm of prostate: Secondary | ICD-10-CM

## 2019-07-18 DIAGNOSIS — W19XXXA Unspecified fall, initial encounter: Secondary | ICD-10-CM | POA: Diagnosis present

## 2019-07-18 DIAGNOSIS — Z6832 Body mass index (BMI) 32.0-32.9, adult: Secondary | ICD-10-CM

## 2019-07-18 DIAGNOSIS — Z87891 Personal history of nicotine dependence: Secondary | ICD-10-CM

## 2019-07-18 LAB — CBC WITH DIFFERENTIAL/PLATELET
Abs Immature Granulocytes: 0.12 10*3/uL — ABNORMAL HIGH (ref 0.00–0.07)
Basophils Absolute: 0 10*3/uL (ref 0.0–0.1)
Basophils Relative: 0 %
Eosinophils Absolute: 0 10*3/uL (ref 0.0–0.5)
Eosinophils Relative: 0 %
HCT: 36 % (ref 36.0–46.0)
Hemoglobin: 11.2 g/dL — ABNORMAL LOW (ref 12.0–15.0)
Immature Granulocytes: 1 %
Lymphocytes Relative: 7 %
Lymphs Abs: 0.9 10*3/uL (ref 0.7–4.0)
MCH: 28.8 pg (ref 26.0–34.0)
MCHC: 31.1 g/dL (ref 30.0–36.0)
MCV: 92.5 fL (ref 80.0–100.0)
Monocytes Absolute: 0.7 10*3/uL (ref 0.1–1.0)
Monocytes Relative: 5 %
Neutro Abs: 11.6 10*3/uL — ABNORMAL HIGH (ref 1.7–7.7)
Neutrophils Relative %: 87 %
Platelets: 266 10*3/uL (ref 150–400)
RBC: 3.89 MIL/uL (ref 3.87–5.11)
RDW: 15.4 % (ref 11.5–15.5)
WBC: 13.4 10*3/uL — ABNORMAL HIGH (ref 4.0–10.5)
nRBC: 0 % (ref 0.0–0.2)

## 2019-07-18 LAB — POC URINALSYSI DIPSTICK (AUTOMATED)
Bilirubin, UA: NEGATIVE
Glucose, UA: NEGATIVE
Ketones, UA: NEGATIVE
Protein, UA: POSITIVE — AB
Spec Grav, UA: 1.015 (ref 1.010–1.025)
Urobilinogen, UA: 0.2 E.U./dL
pH, UA: 6 (ref 5.0–8.0)

## 2019-07-18 LAB — BASIC METABOLIC PANEL
Anion gap: 11 (ref 5–15)
BUN: 19 mg/dL (ref 8–23)
CO2: 24 mmol/L (ref 22–32)
Calcium: 9.1 mg/dL (ref 8.9–10.3)
Chloride: 104 mmol/L (ref 98–111)
Creatinine, Ser: 1.12 mg/dL — ABNORMAL HIGH (ref 0.44–1.00)
GFR calc Af Amer: 51 mL/min — ABNORMAL LOW (ref >60)
GFR calc non Af Amer: 44 mL/min — ABNORMAL LOW (ref >60)
Glucose, Bld: 120 mg/dL — ABNORMAL HIGH (ref 70–99)
Potassium: 4.7 mmol/L (ref 3.5–5.1)
Sodium: 139 mmol/L (ref 135–145)

## 2019-07-18 MED ORDER — DOCUSATE SODIUM 100 MG PO CAPS
100.0000 mg | ORAL_CAPSULE | Freq: Every day | ORAL | Status: DC
  Administered 2019-07-19 – 2019-07-21 (×3): 100 mg via ORAL
  Filled 2019-07-18 (×3): qty 1

## 2019-07-18 MED ORDER — ACETAMINOPHEN 325 MG PO TABS
650.0000 mg | ORAL_TABLET | Freq: Four times a day (QID) | ORAL | Status: DC | PRN
  Administered 2019-07-20 – 2019-07-25 (×3): 650 mg via ORAL
  Filled 2019-07-18 (×3): qty 2

## 2019-07-18 MED ORDER — AMOXICILLIN-POT CLAVULANATE 875-125 MG PO TABS: 1.0000 | ORAL_TABLET | Freq: Two times a day (BID) | ORAL | 0 refills | Status: DC

## 2019-07-18 MED ORDER — SODIUM CHLORIDE 0.9 % IV BOLUS
1000.0000 mL | Freq: Once | INTRAVENOUS | Status: AC
  Administered 2019-07-18: 19:00:00 1000 mL via INTRAVENOUS

## 2019-07-18 MED ORDER — ONDANSETRON HCL 4 MG PO TABS: 4.0000 mg | ORAL_TABLET | Freq: Four times a day (QID) | ORAL | Status: DC | PRN

## 2019-07-18 MED ORDER — FLUTICASONE PROPIONATE 50 MCG/ACT NA SUSP
2.0000 | Freq: Every day | NASAL | Status: DC
  Administered 2019-07-19 – 2019-07-25 (×7): 2 via NASAL
  Filled 2019-07-18: qty 16

## 2019-07-18 MED ORDER — AZITHROMYCIN 250 MG PO TABS
250.0000 mg | ORAL_TABLET | Freq: Every day | ORAL | Status: AC
  Administered 2019-07-19 – 2019-07-22 (×4): 250 mg via ORAL
  Filled 2019-07-18 (×4): qty 1

## 2019-07-18 MED ORDER — ACETAMINOPHEN 650 MG RE SUPP: 650.0000 mg | Freq: Four times a day (QID) | RECTAL | Status: DC | PRN

## 2019-07-18 MED ORDER — AMLODIPINE BESYLATE 5 MG PO TABS
5.0000 mg | ORAL_TABLET | Freq: Every day | ORAL | Status: DC
  Filled 2019-07-18: qty 1

## 2019-07-18 MED ORDER — ONDANSETRON HCL 4 MG/2ML IJ SOLN
4.0000 mg | Freq: Four times a day (QID) | INTRAMUSCULAR | Status: DC | PRN
  Administered 2019-07-18 – 2019-07-24 (×5): 4 mg via INTRAVENOUS
  Filled 2019-07-18 (×5): qty 2

## 2019-07-18 MED ORDER — AZITHROMYCIN 250 MG PO TABS
500.0000 mg | ORAL_TABLET | Freq: Every day | ORAL | Status: AC
  Administered 2019-07-18: 500 mg via ORAL
  Filled 2019-07-18: qty 2

## 2019-07-18 MED ORDER — FLUTICASONE PROPIONATE 50 MCG/ACT NA SUSP: 2.0000 | Freq: Every day | NASAL | 6 refills | Status: AC

## 2019-07-18 MED ORDER — SODIUM CHLORIDE 0.9 % IV SOLN
1.0000 g | INTRAVENOUS | Status: DC
  Administered 2019-07-19 – 2019-07-21 (×3): 1 g via INTRAVENOUS
  Filled 2019-07-18 (×2): qty 1
  Filled 2019-07-18 (×2): qty 10

## 2019-07-18 MED ORDER — FENOFIBRATE 160 MG PO TABS
160.0000 mg | ORAL_TABLET | Freq: Every day | ORAL | Status: DC
  Administered 2019-07-19 – 2019-07-25 (×7): 160 mg via ORAL
  Filled 2019-07-18 (×7): qty 1

## 2019-07-18 MED ORDER — LORAZEPAM 2 MG/ML IJ SOLN
0.5000 mg | Freq: Once | INTRAMUSCULAR | Status: AC
  Administered 2019-07-18: 20:00:00 0.5 mg via INTRAVENOUS
  Filled 2019-07-18: qty 1

## 2019-07-18 MED ORDER — LOSARTAN POTASSIUM 50 MG PO TABS
100.0000 mg | ORAL_TABLET | Freq: Every day | ORAL | Status: DC
  Administered 2019-07-19: 100 mg via ORAL
  Filled 2019-07-18: qty 2

## 2019-07-18 MED ORDER — ENOXAPARIN SODIUM 40 MG/0.4ML ~~LOC~~ SOLN
40.0000 mg | SUBCUTANEOUS | Status: DC
  Administered 2019-07-18 – 2019-07-24 (×7): 40 mg via SUBCUTANEOUS
  Filled 2019-07-18 (×6): qty 0.4

## 2019-07-18 MED ORDER — SODIUM CHLORIDE 0.9 % IV SOLN
1.0000 g | Freq: Once | INTRAVENOUS | Status: AC
  Administered 2019-07-18: 1 g via INTRAVENOUS
  Filled 2019-07-18: qty 10

## 2019-07-18 NOTE — ED Notes (Signed)
ED TO INPATIENT HANDOFF REPORT  Name/Age/Gender Deanna [name] 86 y.o. female  Code Status    Code Status Orders  (From admission, onward)         Start     Ordered   07/18/19 2131  Do not attempt resuscitation (DNR)  Continuous    Question Answer Comment  In the event of cardiac or respiratory ARREST Do not call a "code blue"   In the event of cardiac or respiratory ARREST Do not perform Intubation, CPR, defibrillation or ACLS   In the event of cardiac or respiratory ARREST Use medication by any route, position, wound care, and other measures to relive pain and suffering. May use oxygen, suction and manual treatment of airway obstruction as needed for comfort.   Comments Confirmed with daughter      07/18/19 2130        Code Status History    Date Active Date Inactive Code Status Order ID Comments User Context   07/18/2019 2059 07/18/2019 2130 Full Code [id number]  Etta Quill, DO ED   04/20/2013 0813 04/23/2013 2058 DNR [id number]  Caren Griffins, MD ED   03/06/2012 2134 03/08/2012 2121 Full Code [id number]  Ethel Rana, RN Inpatient   Advance Care Planning Activity      Home/SNF/Other Home  Chief Complaint UTI (urinary tract infection) [N39.0]  Level of Care/Admitting Diagnosis ED Disposition    ED Disposition Condition [facility] Area: [facility] [[id number]] Level of Care: Med-Surg [16] Covid Evaluation: Asymptomatic Screening Protocol (No Symptoms) Diagnosis: UTI (urinary tract infection) [id number] Admitting Physician: Etta Quill [[id number]] At tending Physician: Etta Quill [phone]       Medical History Past Medical History:  Diagnosis Date  . AAA (abdominal aortic aneurysm) [facility]) 2011   Dr Early repaired this  . Acute ischemic colitis ([place]) 03/06/2012  . Anemia    patient denies   . Arthritis    bilateral hands and knees  . Bleeding ulcer 1980s   H. Pylori  . Blood transfusion without reported diagnosis    . Cancer ([place])    hx of skin cancer on face   . Cataract    In the past  . Chronic urinary tract infection   . Cirrhosis, biliary ([place])    liver bx 01/2008  . Diastolic CHF ([place]) [id number]  . Epistaxis   . Fatigue   . GERD (gastroesophageal reflux disease)   . HLD (hyperlipidemia)   . HTN (hypertension)   . Hypertension   . Leg pain    bilateral  . OSA (obstructive sleep apnea)    mild on ss of 12/2008  . Pneumonia    hx of years ago   . Short-term memory loss    related to trauma of 08/2015   . Subdural hematoma ([place]) 08/2015  . Thoracic aortic aneurysm ([facility])     Allergies Allergies  Allergen Reactions  . Keppra [Levetiracetam] Shortness Of Breath  . Crestor [Rosuvastatin] Other (See Comments)    Muscle weakness  . Ezetimibe Other (See Comments)    Muscle weakness    IV Location/Drains/Wounds Patient Lines/Drains/Airways Status   Active Line/Drains/Airways    Name:   Placement date:   Placement time:   Site:   Days:   Peripheral IV 07/18/19 Right Hand   07/18/19    1857    Hand   less than 1   Airway   11/05/15    1304  1351          Labs/Imaging Results for orders placed or performed during the hospital encounter of 07/18/19 (from the past 48 hour(s))  Basic metabolic panel     Status: Abnormal   Collection Time: 07/18/19  5:39 PM  Result Value Ref Range   Sodium 139 135 - 145 mmol/L   Potassium 4.7 3.5 - 5.1 mmol/L   Chloride 104 98 - 111 mmol/L   CO2 24 22 - 32 mmol/L   Glucose, Bld 120 (H) 70 - 99 mg/dL    Comment: Glucose reference range applies only to samples taken after fasting for at least 8 hours.   BUN 19 8 - 23 mg/dL   Creatinine, Ser 1.12 (H) 0.44 - 1.00 mg/dL   Calcium 9.1 8.9 - 10.3 mg/dL   GFR calc non Af Amer 44 (L) >60 mL/min   GFR calc Af Amer 51 (L) >60 mL/min   Anion gap 11 5 - 15    Comment: Performed at [facility], [place] [employer] [address] [place], [place] [address]  CBC with Differential/Platelet     Status:  Abnormal   Collection Time: 07/18/19  7:45 PM  Result Value Ref Range   WBC 13.4 (H) 4.0 - 10.5 K/uL   RBC 3.89 3.87 - 5.11 MIL/uL   Hemoglobin 11.2 (L) 12.0 - 15.0 g/dL   HCT 36.0 36.0 - 46.0 %   MCV 92.5 80.0 - 100.0 fL   MCH 28.8 26.0 - 34.0 pg   MCHC 31.1 30.0 - 36.0 g/dL   RDW 15.4 11.5 - 15.5 %   Platelets 266 150 - 400 K/uL   nRBC 0.0 0.0 - 0.2 %   Neutrophils Relative % 87 %   Neutro Abs 11.6 (H) 1.7 - 7.7 K/uL   Lymphocytes Relative 7 %   Lymphs Abs 0.9 0.7 - 4.0 K/uL   Monocytes Relative 5 %   Monocytes Absolute 0.7 0.1 - 1.0 K/uL   Eosinophils Relative 0 %   Eosinophils Absolute 0.0 0.0 - 0.5 K/uL   Basophils Relative 0 %   Basophils Absolute 0.0 0.0 - 0.1 K/uL   Immature Granulocytes 1 %   Abs Immature Granulocytes 0.12 (H) 0.00 - 0.07 K/uL    Comment: Performed at [facility], [place] [address] [place], [place] [address]   DG Chest 2 View  Result Date: 07/18/2019 CLINICAL DATA:  Cough, UTI, witnessed fall EXAM: CHEST - 2 VIEW COMPARISON:  Radiograph 06/12/2018, CTA 06/09/2016 FINDINGS: Lung volumes are low with some streaky basilar atelectatic changes. More patchy opacities noted in right lung base poorly visualized on lateral radiograph which could reflect some atelectatic change, less likely consolidation. Central vascular crowding is noted. Pulmonary vascularity remains fairly well-defined however without convincing features of edema. No pneumothorax or visible effusion. Cardiac size is stable from priors. There is a calcified and tortuous aorta with fusiform aneurysmal dilatation of the distal aortic arch to 5.8 cm best appreciated on lateral radiograph, not significantly increased in size from comparison radiograph 06/12/2018. In The osseous structures appear diffusely demineralized which may limit detection of small or nondisplaced fractures. No acute osseous or soft tissue abnormality. Degenerative changes are present in the imaged spine and  shoulders. Telemetry leads overlie the chest. IMPRESSION: Low lung volumes with streaky bibasilar atelectasis. Patchy right basilar opacities poorly visualized on lateral radiograph which could reflect some atelectatic change, less likely consolidation. No acute traumatic findings in the chest. Aneurysmal dilatation of the distal aortic  arch up to 5.8 cm not significantly changed from 06/12/2018 radiograph, previously characterized on CT angiography. Of note patient should be receiving routine aneurysmal surveillance which does not appear to have been performed since 06/09/2016. Consider outpatient CT or MR angiography of the chest. Electronically Signed   By: Lovena Le M.D.   On: 07/18/2019 18:56   DG Tibia/Fibula Left  Result Date: 07/18/2019 CLINICAL DATA:  Pain following fall EXAM: LEFT TIBIA AND FIBULA - 2 VIEW COMPARISON:  None. FINDINGS: Frontal and lateral views obtained. No demonstrable fracture or dislocation. Mild osteoarthritic change in the knee and ankle regions. There is popliteal artery atherosclerotic calcification. IMPRESSION: No fracture or dislocation. Osteoarthritic change in the left knee and ankle regions. Left popliteal artery atherosclerosis. Electronically Signed   By: Lowella Grip III M.D.   On: 07/18/2019 18:51   CT Head Wo Contrast  Addendum Date: 07/18/2019   ADDENDUM REPORT: 07/18/2019 20:59 ADDENDUM: The results and limitations of this exam were called by telephone at the time of interpretation on 07/18/2019 at 8:49 pm to provider [facility] , who verbally acknowledged these results. Electronically Signed   By: Lovena Le M.D.   On: 07/18/2019 20:59   Result Date: 07/18/2019 CLINICAL DATA:  Fall, loss of consciousness EXAM: CT HEAD WITHOUT CONTRAST TECHNIQUE: Contiguous axial images were obtained from the base of the skull through the vertex without intravenous contrast. COMPARISON:  CT head 09/26/2017 FINDINGS: Brain: There is extensive patient motion artifact  which limits the diagnostic utility of this examination and may obscure small peripheral bleeds or extra-axial collections. This is despite multiple attempts at acquisition and restraint of the patient. No definite acute intracranial abnormality is seen. Specifically, there is no convincing evidence of acute infarction, hemorrhage, hydrocephalus, visible extra-axial collection or mass lesion/mass effect. Symmetric prominence of the ventricles, cisterns and sulci compatible with parenchymal volume loss. Patchy areas of white matter hypoattenuation are most compatible with chronic microvascular angiopathy. Vascular: Atherosclerotic calcification of the carotid siphons. No hyperdense vessel. Skull: Postsurgical changes from prior right frontotemporal craniotomy. No complication is evident. No focal scalp swelling or hematoma. No calvarial fracture or suspicious osseous lesion. Sinuses/Orbits: Paranasal sinuses and mastoid air cells are predominantly clear. Orbital structures are unremarkable aside from prior lens extractions. Other: None IMPRESSION: Extensive patient motion artifact limits the diagnostic utility of this examination. Small peripheral abnormalities may be obscured. No definite acute intracranial abnormality is evident within the limitations of this examination detailed above. Prior right frontotemporal craniotomy. Mild parenchymal volume loss and chronic white matter changes. Electronically Signed: By: Lovena Le M.D. On: 07/18/2019 20:50    Pending Labs Unresulted Labs (From admission, onward)    Start     Ordered   07/19/19 0500  CBC  Tomorrow morning,   R     07/18/19 2058   07/19/19 [id number]  Basic metabolic panel  Tomorrow morning,   R     07/18/19 2058   07/18/19 2133  Culture, blood (routine x 2)  BLOOD CULTURE X 2,   R (with STAT occurrences)     07/18/19 2132   07/18/19 2035  SARS CORONAVIRUS 2 (TAT 6-24 HRS) Nasopharyngeal Nasopharyngeal Swab  (Tier 3 (TAT 6-24 hrs))  Once,   STAT     Question Answer Comment  Is this test for diagnosis or screening Screening   Symptomatic for COVID-19 as defined by CDC No   Hospitalized for COVID-19 No   Admitted to ICU for COVID-19 No   Previously tested for  COVID-19 No   Resident in a congregate (group) care setting No   Employed in healthcare setting No   Pregnant No      07/18/19 2035   07/18/19 1804  CBC with Differential/Platelet  Once,   STAT     07/18/19 1804   07/18/19 1710  CBC with Differential  Once,   STAT     07/18/19 1709   07/18/19 1710  Urinalysis, Routine w reflex microscopic  ONCE - STAT,   STAT     07/18/19 1709          Vitals/Pain Today's Vitals   07/18/19 1700 07/18/19 1702 07/18/19 1900 07/18/19 1945  BP: (!) 146/86  (!) 159/96   Pulse: 92 91 (!) 108   Resp:  17 16   Temp:      TempSrc:      SpO2: 98% 100% 100%   Weight:      Height:      PainSc:    0-No pain    Isolation Precautions No active isolations  Medications Medications  amLODipine (NORVASC) tablet 5 mg (has no administration in time range)  docusate sodium (COLACE) capsule 100 mg (has no administration in time range)  losartan (COZAAR) tablet 100 mg (has no administration in time range)  fluticasone (FLONASE) 50 MCG/ACT nasal spray 2 spray (has no administration in time range)  fenofibrate tablet 160 mg (has no administration in time range)  cefTRIAXone (ROCEPHIN) 1 g in sodium chloride 0.9 % 100 mL IVPB (has no administration in time range)  ondansetron (ZOFRAN) tablet 4 mg ( Oral See Alternative 07/18/19 2123)    Or  ondansetron (ZOFRAN) injection 4 mg (4 mg Intravenous Given 07/18/19 2123)  enoxaparin (LOVENOX) injection 40 mg (has no administration in time range)  acetaminophen (TYLENOL) tablet 650 mg (has no administration in time range)    Or  acetaminophen (TYLENOL) suppository 650 mg (has no administration in time range)  azithromycin (ZITHROMAX) tablet 500 mg (has no administration in time range)    Followed by   azithromycin (ZITHROMAX) tablet 250 mg (has no administration in time range)  sodium chloride 0.9 % bolus 1,000 mL (1,000 mLs Intravenous New Bag/Given 07/18/19 1857)  cefTRIAXone (ROCEPHIN) 1 g in sodium chloride 0.9 % 100 mL IVPB (0 g Intravenous Stopped 07/18/19 1929)  LORazepam (ATIVAN) injection 0.5 mg (0.5 mg Intravenous Given 07/18/19 1938)    Mobility walks with device

## 2019-07-18 NOTE — ED Notes (Signed)
Pt lying in bed. Full monitor on. Family at bedside. NAD noted. Pt ready for CT. Will continue to monitor.

## 2019-07-18 NOTE — ED Provider Notes (Signed)
[facility] DEPT Provider Note   CSN: [id number] Arrival date & time: 07/18/19  1640     History Chief Complaint  Patient presents with   Fall   Urinary Tract Infection    Triston Blakey is a 86 y.o. female past medical history of AAA, anemia, bleeding ulcer, TBI, hypertension, GERD who presents for evaluation of UTI and fall.  Daughter is at bedside who provides with history.  She reports that patient was taken to the doctor today by her caregivers for evaluation of foul-smelling urine.  She was found to have UTI.  She had antibiotics that were picked up but she has not taken them yet.  Daughter states that the caregivers were taking her home and states that they are walking at the step and that she had her legs give out on her and fell.  She does not think she hit her head but ports that afterwards, she was slightly dazed and confused.  They were unable to get her up which is what prompted EMS call.  She feels like mom may be dehydrated.  She states that mom always complains of some COPD and shortness of breath but has not had any recent symptoms that are worsened.  No fevers.  No vomiting.  The history is provided by a relative.       Past Medical History:  Diagnosis Date   AAA (abdominal aortic aneurysm) ([place]) 2011   Dr Early repaired this   Acute ischemic colitis ([place]) 03/06/2012   Anemia    patient denies    Arthritis    bilateral hands and knees   Bleeding ulcer 1980s   H. Pylori   Blood transfusion without reported diagnosis    Cancer ([place])    hx of skin cancer on face    Cataract    In the past   Chronic urinary tract infection    Cirrhosis, biliary ([place])    liver bx 0000000   Diastolic CHF ([place]) [id number]   Epistaxis    Fatigue    GERD (gastroesophageal reflux disease)    HLD (hyperlipidemia)    HTN (hypertension)    Hypertension    Leg pain    bilateral   OSA (obstructive sleep apnea)    mild on ss of  12/2008   Pneumonia    hx of years ago    Short-term memory loss    related to trauma of 08/2015    Subdural hematoma ([place]) 08/2015   Thoracic aortic aneurysm [facility])     Patient Active Problem List   Diagnosis Date Noted   Abnormal urine odor 07/18/2019   Acute non-recurrent pansinusitis 123XX123   Acute metabolic encephalopathy 123XX123   Bronchitis 06/12/2018   Acute alteration in mental status 09/26/2017   Hyperlipidemia LDL goal <100 09/26/2017   Open wnd of scalp 05/12/2017   Fatigue 05/12/2017   Calculi, ureter 09/04/2015   Acute lower UTI 09/04/2015   Biliary and gallbladder disorder 09/02/2015   Aneurysm of thoracic aorta ([place]) 09/02/2015   Subdural hematoma ([place]) 08/29/2015   Subdural hemorrhage ([place]) [id number]   Injury inflicted to the body by an external force 08/29/2015   Pain in the abdomen 06/11/2014   Constipation 06/11/2014   Preop cardiovascular exam 05/24/2013   Obesity (BMI 30-39.9) 05/12/2013   Pulmonary hypertension ([place]) Q000111Q   Diastolic CHF ([place]) Q000111Q   Acute cholangitis 04/20/2013   Dyspnea on exertion 04/20/2013   Calculus of bile duct without mention of cholecystitis or obstruction  04/20/2013   Nonspecific (abnormal) findings on radiological and other examination of biliary tract 04/20/2013   Celiac artery stenosis ([place]) 05/01/2012   Acute ischemic colitis ([place]) 03/06/2012   Hyperkalemia 03/06/2012   Dehydration 03/06/2012   Leukocytosis 03/06/2012   Leg weakness, bilateral 01/01/2012   HYPERGLYCEMIA 06/15/2010   SMOKER 01/29/2010   MURMUR 01/29/2010   ELECTROCARDIOGRAM, ABNORMAL 01/29/2010   BILIARY CIRRHOSIS, PRIMARY 01/08/2010   FATIGUE 06/26/2009   EPISTAXIS 06/26/2009   OBSTRUCTIVE SLEEP APNEA 12/19/2008   LEG PAIN, BILATERAL 04/08/2008   GERD 12/27/2007   ALANINE AMINOTRANSFERASE, SERUM, ELEVATED 12/05/2007   Nonspecific abnormal results of liver function study 07/04/2007     Hyperlipidemia 06/13/2007   Essential hypertension 11/06/2006    Past Surgical History:  Procedure Laterality Date   ABDOMINAL AORTIC ANEURYSM REPAIR  02/01/2010   BREAST BIOPSY     CATARACT EXTRACTION     COLONOSCOPY  03/08/2012   Procedure: COLONOSCOPY;  Surgeon: Gatha Mayer, MD;  Location: [place] [place];  Service: Endoscopy;  Laterality: N/A;   CRANIOTOMY     for evacuation of subdural hematoma - 08/29/15    CYSTOSCOPY WITH HOLMIUM LASER LITHOTRIPSY Right 11/05/2015   Procedure: CYSTOSCOPY WITH HOLMIUM LASER LITHOTRIPSY;  Surgeon: Irine Seal, MD;  Location: [facility];  Service: Urology;  Laterality: Right;   CYSTOSCOPY WITH URETEROSCOPY, STONE BASKETRY AND STENT PLACEMENT Right 11/05/2015   Procedure: CYSTOSCOPY WITH URETEROSCOPY, STONE BASKETRY AND STENT EXCHANGE;  Surgeon: Irine Seal, MD;  Location: [facility];  Service: Urology;  Laterality: Right;   ERCP N/A 04/20/2013   Procedure: ENDOSCOPIC RETROGRADE CHOLANGIOPANCREATOGRAPHY (ERCP);  Surgeon: Ladene Artist, MD;  Location: [facility];  Service: Endoscopy;  Laterality: N/A;   ILIAC ARTERY - FEMORAL ARTERY BYPASS GRAFT  2011   Dr Donnetta Hutching     OB History   No obstetric history on file.     Family History  Problem Relation Age of Onset   Cancer Brother        prostate    Social History   Tobacco Use   Smoking status: Former Smoker    Years: 52.00    Types: Cigarettes    Quit date: 02/01/2010    Years since quitting: 9.4   Smokeless tobacco: Never Used  Substance Use Topics   Alcohol use: No    Alcohol/week: 0.0 standard drinks   Drug use: No    Home Medications Prior to Admission medications   Medication Sig Start Date End Date Taking? Authorizing Provider  amLODipine (NORVASC) 5 MG tablet TAKE 1 TABLET (5 MG TOTAL) BY MOUTH DAILY. CALL TO SCHEDULE APPOINTMENT Patient taking differently: Take 5 mg by mouth daily.  06/17/19  Yes Roma Schanz R, DO  docusate sodium (COLACE) 100 MG capsule Take 100 mg  by mouth daily.   Yes [provider]  fenofibrate 160 MG tablet TAKE 1 TABLET BY MOUTH EVERY DAY Patient taking differently: Take 160 mg by mouth daily.  01/29/19  Yes Roma Schanz R, DO  losartan (COZAAR) 100 MG tablet TAKE 1 TABLET DAILY Patient taking differently: Take 100 mg by mouth daily.  04/24/19  Yes Roma Schanz R, DO  nystatin cream (MYCOSTATIN) APPLY TO AFFECTED AREA TWICE A DAY Patient taking differently: Apply 1 application topically every 3 (three) days. With baths 05/27/19  Yes Roma Schanz R, DO  amoxicillin-clavulanate (AUGMENTIN) 875-125 MG tablet Take 1 tablet by mouth 2 (two) times daily. 07/18/19   Roma Schanz R, DO  fluticasone Asencion Islam)  50 MCG/ACT nasal spray Place 2 sprays into both nostrils daily. 07/18/19   Ann Held, DO  NONFORMULARY OR COMPOUNDED [facility] bed Dx dementia , hx subdural hemorrhage 09/26/17   Ann Held, DO  NONFORMULARY OR COMPOUNDED ITEM In and out cath  # 1   Dx mental status change, incontinence of urine 09/26/17   Carollee Herter, Alferd Apa, DO    Allergies    Keppra [levetiracetam], Crestor [rosuvastatin], and Ezetimibe  Review of Systems   Review of Systems  Unable to perform ROS: Dementia    Physical Exam Updated Vital Signs BP (!) 159/96    Pulse (!) 108    Temp (!) 97.5 F (36.4 C) (Oral)    Resp 16    Ht 5\' 9"  (1.753 m)    Wt 100.7 kg    SpO2 100%    BMI 32.78 kg/m   Physical Exam Vitals and nursing note reviewed.  Constitutional:      Appearance: Normal appearance. She is well-developed.  HENT:     Head: Normocephalic and atraumatic.     Comments: Skull deformity that appears chronic in nature.  No underlying crepitus.  Eyes:     General: Lids are normal.     Conjunctiva/sclera: Conjunctivae normal.     Pupils: Pupils are equal, round, and reactive to light.  Cardiovascular:     Rate and Rhythm: Normal rate and regular rhythm.     Pulses: Normal pulses.     Heart  sounds: Normal heart sounds. No murmur. No friction rub. No gallop.   Pulmonary:     Effort: Pulmonary effort is normal.     Breath sounds: Normal breath sounds.     Comments: Intermittently coughing. Lungs clear to auscultation bilaterally.  Symmetric chest rise.  No wheezing, rales, rhonchi.  Abdominal:     Palpations: Abdomen is soft. Abdomen is not rigid.     Tenderness: There is no abdominal tenderness. There is no guarding.  Musculoskeletal:        General: Normal range of motion.     Cervical back: Full passive range of motion without pain.     Comments: Mild tenderness noted to the anterior aspect left tib-fib.  No deformity or crepitus noted.  Skin:    General: Skin is warm and dry.     Capillary Refill: Capillary refill takes less than 2 seconds.     Comments: Abrasion noted to anterior aspect of left tib-fib.   Neurological:     Mental Status: She is alert.     Comments: Alert and oriented x 2  Follows commands 5/5 BUE and BLE   Psychiatric:        Speech: Speech normal.     ED Results / Procedures / Treatments   Labs (all labs ordered are listed, but only abnormal results are displayed) Labs Reviewed  BASIC METABOLIC PANEL - Abnormal; Notable for the following components:      Result Value   Glucose, Bld 120 (*)    Creatinine, Ser 1.12 (*)    GFR calc non Af Amer 44 (*)    GFR calc Af Amer 51 (*)    All other components within normal limits  CBC WITH DIFFERENTIAL/PLATELET - Abnormal; Notable for the following components:   WBC 13.4 (*)    Hemoglobin 11.2 (*)    Neutro Abs 11.6 (*)    Abs Immature Granulocytes 0.12 (*)    All other components within normal limits  SARS CORONAVIRUS  2 (TAT 6-24 HRS)  CBC WITH DIFFERENTIAL/PLATELET  URINALYSIS, ROUTINE W REFLEX MICROSCOPIC  CBC WITH DIFFERENTIAL/PLATELET    EKG None  Radiology DG Chest 2 View  Result Date: 07/18/2019 CLINICAL DATA:  Cough, UTI, witnessed fall EXAM: CHEST - 2 VIEW COMPARISON:  Radiograph  06/12/2018, CTA 06/09/2016 FINDINGS: Lung volumes are low with some streaky basilar atelectatic changes. More patchy opacities noted in right lung base poorly visualized on lateral radiograph which could reflect some atelectatic change, less likely consolidation. Central vascular crowding is noted. Pulmonary vascularity remains fairly well-defined however without convincing features of edema. No pneumothorax or visible effusion. Cardiac size is stable from priors. There is a calcified and tortuous aorta with fusiform aneurysmal dilatation of the distal aortic arch to 5.8 cm best appreciated on lateral radiograph, not significantly increased in size from comparison radiograph 06/12/2018. In The osseous structures appear diffusely demineralized which may limit detection of small or nondisplaced fractures. No acute osseous or soft tissue abnormality. Degenerative changes are present in the imaged spine and shoulders. Telemetry leads overlie the chest. IMPRESSION: Low lung volumes with streaky bibasilar atelectasis. Patchy right basilar opacities poorly visualized on lateral radiograph which could reflect some atelectatic change, less likely consolidation. No acute traumatic findings in the chest. 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 Of note patient should be receiving routine aneurysmal surveillance which does not appear to have been performed since 06/09/2016. Consider outpatient CT or MR angiography of the chest. Electronically Signed   By: Lovena Le M.D.   On: 07/18/2019 18:56   DG Tibia/Fibula Left  Result Date: 07/18/2019 CLINICAL DATA:  Pain following fall EXAM: LEFT TIBIA AND FIBULA - 2 VIEW COMPARISON:  None. FINDINGS: Frontal and lateral views obtained. No demonstrable fracture or dislocation. Mild osteoarthritic change in the knee and ankle regions. There is popliteal artery  atherosclerotic calcification. IMPRESSION: No fracture or dislocation. Osteoarthritic change in the left knee and ankle regions. Left popliteal artery atherosclerosis. Electronically Signed   By: Lowella Grip III M.D.   On: 07/18/2019 18:51    Procedures Procedures (including critical care time)  Medications Ordered in ED Medications  amLODipine (NORVASC) tablet 5 mg (has no administration in time range)  docusate sodium (COLACE) capsule 100 mg (has no administration in time range)  losartan (COZAAR) tablet 100 mg (has no administration in time range)  fluticasone (FLONASE) 50 MCG/ACT nasal spray 2 spray (has no administration in time range)  fenofibrate tablet 160 mg (has no administration in time range)  cefTRIAXone (ROCEPHIN) 1 g in sodium chloride 0.9 % 100 mL IVPB (has no administration in time range)  sodium chloride 0.9 % bolus 1,000 mL (1,000 mLs Intravenous New Bag/Given 07/18/19 1857)  cefTRIAXone (ROCEPHIN) 1 g in sodium chloride 0.9 % 100 mL IVPB (0 g Intravenous Stopped 07/18/19 1929)  LORazepam (ATIVAN) injection 0.5 mg (0.5 mg Intravenous Given 07/18/19 1938)    ED Course  I have reviewed the triage vital signs and the nursing notes.  Pertinent labs & imaging results that were available during my care of the patient were reviewed by me and considered in my medical decision making (see chart for details).    MDM Rules/Calculators/A&P                      87 year old female who presents for evaluation of fall, increased agitation.  Daughter reports that they have a caregiver  who noticed some foul-smelling urine.  Was taken to the PCP and found to have UTI.  Was discharged home on Augmentin but has not picked it up yet.  She sustained a ground-level fall and was too weak to get up and was more agitated than normal.  No fevers, nausea/vomiting.  Initially arrival, she is afebrile, nontoxic-appearing.  Vital signs are stable.  On my exam, she is alert and oriented x2.  She has  a skin abrasion noted to the anterior aspect of her tib-fib.  She is not complaining of any pain.  We will plan check labs, CT head, x-ray of tib-fib.  Reviewed her UA that was done today by outpatient facility which is positive for leukocytes, nitrites.  Urine culture was sent but has not come back yet.  Tib-fib x-ray shows no evidence of fracture dislocation.  Chest x-ray shows no acute bony abnormalities.  She has some patchy right basilar opacities that are poorly visualized on lateral radiograph.  She has a aneurysmal dilatation of the distal aortic arch up to 5.8 cm not significantly changed from previous imaging on 06/12/2018.  CBC shows leukocytosis of 13.4.  Hemoglobin is 11.2.  BMP shows BUN of 19, creatinine of 1.12.  Patient given fluids, ceftriaxone for UTI.  RN informing the patient was very agitated and was unable to be calmed down and redirected.  Patient given dose of Ativan.  I discussed with radiologist.  CT scan is difficult to read given motion artifact.  No gross abnormality but cannot rule out any minor subarachnoid.  Daughter states that she does not think patient hit her head.  She did not actually witness the fall but caregiver states that she nodded her head.  Given leukocytosis, UTI, worsening weakness, agitation, will plan for admission.  I discussed with hospitalist who agrees for admission.  I discussed with social work who will plan to consult on patient during admission for possible long-term placement.  Portions of this note were generated with [employer]. Dictation errors may occur despite best attempts at proofreading.  Final Clinical Impression(s) / ED Diagnoses Final diagnoses:  Acute cystitis without hematuria    Rx / DC Orders ED Discharge Orders    None       Desma Mcgregor 07/18/19 2054    Davonna Belling, MD 07/19/19 0030

## 2019-07-18 NOTE — Assessment & Plan Note (Signed)
abx per orders Recheck 2 weeks  Culture pending

## 2019-07-18 NOTE — H&P (Signed)
History and Physical    Jemina Bunce [id number] DOB: 04-Nov-1929 DOA: 07/18/2019  PCP: Ann Held, DO  Patient coming from: Home  I have personally briefly reviewed patient's old medical records in [place]  Chief Complaint: AMS  HPI: Deanna [name] is a 86 y.o. female with medical history significant of TBI prior SDH/SAH requiring craniotomy, HTN.  Presents to ED for evaluation of UTI and fall.  Foul smelling urine, went to PCP today, dx with UTI, culture still pending, augmentin ordered but not yet started.  Went home, had fall at home, 1 min LOC.  In to ED.   ED Course: CT head without acute findings.  CXR ? R sided PNA.  WBC 13k.  Given IVF 1L, developed cough while in ED.  Started on rocephin.   Review of Systems: As per HPI, otherwise all review of systems negative.  Past Medical History:  Diagnosis Date  . AAA (abdominal aortic aneurysm) [facility]) 2011   Dr Early repaired this  . Acute ischemic colitis ([place]) 03/06/2012  . Anemia    patient denies   . Arthritis    bilateral hands and knees  . Bleeding ulcer 1980s   H. Pylori  . Blood transfusion without reported diagnosis   . Cancer ([place])    hx of skin cancer on face   . Cataract    In the past  . Chronic urinary tract infection   . Cirrhosis, biliary ([place])    liver bx 01/2008  . Diastolic CHF ([place]) [id number]  . Epistaxis   . Fatigue   . GERD (gastroesophageal reflux disease)   . HLD (hyperlipidemia)   . HTN (hypertension)   . Hypertension   . Leg pain    bilateral  . OSA (obstructive sleep apnea)    mild on ss of 12/2008  . Pneumonia    hx of years ago   . Short-term memory loss    related to trauma of 08/2015   . Subdural hematoma ([place]) 08/2015  . Thoracic aortic aneurysm [facility])     Past Surgical History:  Procedure Laterality Date  . ABDOMINAL AORTIC ANEURYSM REPAIR  02/01/2010  . BREAST BIOPSY    . CATARACT EXTRACTION    . COLONOSCOPY  03/08/2012   Procedure: COLONOSCOPY;   Surgeon: Gatha Mayer, MD;  Location: [place];  Service: Endoscopy;  Laterality: N/A;  . CRANIOTOMY     for evacuation of subdural hematoma - 08/29/15   . CYSTOSCOPY WITH HOLMIUM LASER LITHOTRIPSY Right 11/05/2015   Procedure: CYSTOSCOPY WITH HOLMIUM LASER LITHOTRIPSY;  Surgeon: Irine Seal, MD;  Location: [facility];  Service: Urology;  Laterality: Right;  . CYSTOSCOPY WITH URETEROSCOPY, STONE BASKETRY AND STENT PLACEMENT Right 11/05/2015   Procedure: CYSTOSCOPY WITH URETEROSCOPY, STONE BASKETRY AND STENT EXCHANGE;  Surgeon: Irine Seal, MD;  Location: [facility];  Service: Urology;  Laterality: Right;  . ERCP N/A 04/20/2013   Procedure: ENDOSCOPIC RETROGRADE CHOLANGIOPANCREATOGRAPHY (ERCP);  Surgeon: Ladene Artist, MD;  Location: [facility];  Service: Endoscopy;  Laterality: N/A;  . ILIAC ARTERY - FEMORAL ARTERY BYPASS GRAFT  2011   Dr Early     reports that she quit smoking about 9 years ago. Her smoking use included cigarettes. She quit after 52.00 years of use. She has never used smokeless tobacco. She reports that she does not drink alcohol or use drugs.  Allergies  Allergen Reactions  . Keppra [Levetiracetam] Shortness Of Breath  . Crestor [Rosuvastatin] Other (See Comments)  Muscle weakness  . Ezetimibe Other (See Comments)    Muscle weakness    Family History  Problem Relation Age of Onset  . Cancer Brother        prostate     Prior to Admission medications   Medication Sig Start Date End Date Taking? Authorizing Provider  amLODipine (NORVASC) 5 MG tablet TAKE 1 TABLET (5 MG TOTAL) BY MOUTH DAILY. CALL TO SCHEDULE APPOINTMENT Patient taking differently: Take 5 mg by mouth daily.  06/17/19  Yes Roma Schanz R, DO  docusate sodium (COLACE) 100 MG capsule Take 100 mg by mouth daily.   Yes [provider]  fenofibrate 160 MG tablet TAKE 1 TABLET BY MOUTH EVERY DAY Patient taking differently: Take 160 mg by mouth daily.  01/29/19  Yes Roma Schanz R, DO    losartan (COZAAR) 100 MG tablet TAKE 1 TABLET DAILY Patient taking differently: Take 100 mg by mouth daily.  04/24/19  Yes Roma Schanz R, DO  nystatin cream (MYCOSTATIN) APPLY TO AFFECTED AREA TWICE A DAY Patient taking differently: Apply 1 application topically every 3 (three) days. With baths 05/27/19  Yes Roma Schanz R, DO  amoxicillin-clavulanate (AUGMENTIN) 875-125 MG tablet Take 1 tablet by mouth 2 (two) times daily. 07/18/19   Roma Schanz R, DO  fluticasone (FLONASE) 50 MCG/ACT nasal spray Place 2 sprays into both nostrils daily. 07/18/19   Ann Held, DO  NONFORMULARY OR COMPOUNDED [facility] bed Dx dementia , hx subdural hemorrhage 09/26/17   Carollee Herter, Alferd Apa, DO  NONFORMULARY OR COMPOUNDED ITEM In and out cath  # 1   Dx mental status change, incontinence of urine 09/26/17   Ann Held, DO    Physical Exam: Vitals:   07/18/19 1655 07/18/19 1700 07/18/19 1702 07/18/19 1900  BP: (!) 148/113 (!) 146/86  (!) 159/96  Pulse: 95 92 91 (!) 108  Resp: (!) 25  17 16   Temp: (!) 97.5 F (36.4 C)     TempSrc: Oral     SpO2: 98% 98% 100% 100%  Weight:      Height:        Constitutional: NAD, calm, comfortable Eyes: PERRL, lids and conjunctivae normal ENMT: Mucous membranes are moist. Posterior pharynx clear of any exudate or lesions.Normal dentition.  Neck: normal, supple, no masses, no thyromegaly Respiratory: Wet sounding cough. Cardiovascular: Regular rate and rhythm, no murmurs / rubs / gallops. No extremity edema. 2+ pedal pulses. No carotid bruits.  Abdomen: no tenderness, no masses palpated. No hepatosplenomegaly. Bowel sounds positive.  Musculoskeletal: no clubbing / cyanosis. No joint deformity upper and lower extremities. Good ROM, no contractures. Normal muscle tone.  Skin: no rashes, lesions, ulcers. No induration Neurologic: CN 2-12 grossly intact. Sensation intact, DTR normal. Strength 5/5 in all 4.  Psychiatric: Normal  judgment and insight. Alert and oriented x 3. Normal mood.    Labs on Admission: I have personally reviewed following labs and imaging studies  CBC: Recent Labs  Lab 07/18/19 1945  WBC 13.4*  NEUTROABS 11.6*  HGB 11.2*  HCT 36.0  MCV 92.5  PLT 123456   Basic Metabolic Panel: Recent Labs  Lab 07/18/19 1739  NA 139  K 4.7  CL 104  CO2 24  GLUCOSE 120*  BUN 19  CREATININE 1.12*  CALCIUM 9.1   GFR: Estimated Creatinine Clearance: 44.7 mL/min (A) (by C-G formula based on SCr of 1.12 mg/dL (H)). Liver Function Tests: No results for input(s):  AST, ALT, ALKPHOS, BILITOT, PROT, ALBUMIN in the last 168 hours. No results for input(s): LIPASE, AMYLASE in the last 168 hours. No results for input(s): AMMONIA in the last 168 hours. Coagulation Profile: No results for input(s): INR, PROTIME in the last 168 hours. Cardiac Enzymes: No results for input(s): CKTOTAL, CKMB, CKMBINDEX, TROPONINI in the last 168 hours. BNP (last 3 results) No results for input(s): PROBNP in the last 8760 hours. HbA1C: No results for input(s): HGBA1C in the last 72 hours. CBG: No results for input(s): GLUCAP in the last 168 hours. Lipid Profile: No results for input(s): CHOL, HDL, LDLCALC, TRIG, CHOLHDL, LDLDIRECT in the last 72 hours. Thyroid Function Tests: No results for input(s): TSH, T4TOTAL, FREET4, T3FREE, THYROIDAB in the last 72 hours. Anemia Panel: No results for input(s): VITAMINB12, FOLATE, FERRITIN, TIBC, IRON, RETICCTPCT in the last 72 hours. Urine analysis:    Component Value Date/Time   COLORURINE ORANGE (A) 04/20/2013 1515   APPEARANCEUR CLOUDY (A) 04/20/2013 1515   LABSPEC >1.046 (H) 04/20/2013 1515   PHURINE 5.5 04/20/2013 1515   GLUCOSEU NEGATIVE 04/20/2013 1515   HGBUR TRACE (A) 04/20/2013 1515   HGBUR negative 06/15/2010 0943   BILIRUBINUR negative 07/18/2019 1448   KETONESUR NEGATIVE 04/20/2013 1515   PROTEINUR Positive (A) 07/18/2019 1448   PROTEINUR NEGATIVE 04/20/2013  1515   UROBILINOGEN 0.2 07/18/2019 1448   UROBILINOGEN 2.0 (H) 04/20/2013 1515   NITRITE postive 07/18/2019 1448   NITRITE POSITIVE (A) 04/20/2013 1515   LEUKOCYTESUR Large (3+) (A) 07/18/2019 1448    Radiological Exams on Admission: DG Chest 2 View  Result Date: 07/18/2019 CLINICAL DATA:  Cough, UTI, witnessed fall EXAM: CHEST - 2 VIEW COMPARISON:  Radiograph 06/12/2018, CTA 06/09/2016 FINDINGS: Lung volumes are low with some streaky basilar atelectatic changes. More patchy opacities noted in right lung base poorly visualized on lateral radiograph which could reflect some atelectatic change, less likely consolidation. Central vascular crowding is noted. Pulmonary vascularity remains fairly well-defined however without convincing features of edema. No pneumothorax or visible effusion. Cardiac size is stable from priors. There is a calcified and tortuous aorta with fusiform aneurysmal dilatation of the distal aortic arch to 5.8 cm best appreciated on lateral radiograph, not significantly increased in size from comparison radiograph 06/12/2018. In The osseous structures appear diffusely demineralized which may limit detection of small or nondisplaced fractures. No acute osseous or soft tissue abnormality. Degenerative changes are present in the imaged spine and shoulders. Telemetry leads overlie the chest. IMPRESSION: Low lung volumes with streaky bibasilar atelectasis. Patchy right basilar opacities poorly visualized on lateral radiograph which could reflect some atelectatic change, less likely consolidation. No acute traumatic findings in the chest. 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 Of note patient should be receiving routine aneurysmal surveillance which does not appear to have been performed since 06/09/2016. Consider outpatient CT or MR angiography of the chest. Electronically  Signed   By: Lovena Le M.D.   On: 07/18/2019 18:56   DG Tibia/Fibula Left  Result Date: 07/18/2019 CLINICAL DATA:  Pain following fall EXAM: LEFT TIBIA AND FIBULA - 2 VIEW COMPARISON:  None. FINDINGS: Frontal and lateral views obtained. No demonstrable fracture or dislocation. Mild osteoarthritic change in the knee and ankle regions. There is popliteal artery atherosclerotic calcification. IMPRESSION: No fracture or dislocation. Osteoarthritic change in the left knee and ankle regions. Left popliteal artery atherosclerosis. Electronically Signed   By: Gwyndolyn Saxon  Jasmine December III M.D.   On: 07/18/2019 18:51   CT Head Wo Contrast  Addendum Date: 07/18/2019   ADDENDUM REPORT: 07/18/2019 20:59 ADDENDUM: The results and limitations of this exam were called by telephone at the time of interpretation on 07/18/2019 at 8:49 pm to provider [facility] , who verbally acknowledged these results. Electronically Signed   By: Lovena Le M.D.   On: 07/18/2019 20:59   Result Date: 07/18/2019 CLINICAL DATA:  Fall, loss of consciousness EXAM: CT HEAD WITHOUT CONTRAST TECHNIQUE: Contiguous axial images were obtained from the base of the skull through the vertex without intravenous contrast. COMPARISON:  CT head 09/26/2017 FINDINGS: Brain: There is extensive patient motion artifact which limits the diagnostic utility of this examination and may obscure small peripheral bleeds or extra-axial collections. This is despite multiple attempts at acquisition and restraint of the patient. No definite acute intracranial abnormality is seen. Specifically, there is no convincing evidence of acute infarction, hemorrhage, hydrocephalus, visible extra-axial collection or mass lesion/mass effect. Symmetric prominence of the ventricles, cisterns and sulci compatible with parenchymal volume loss. Patchy areas of white matter hypoattenuation are most compatible with chronic microvascular angiopathy. Vascular: Atherosclerotic calcification of  the carotid siphons. No hyperdense vessel. Skull: Postsurgical changes from prior right frontotemporal craniotomy. No complication is evident. No focal scalp swelling or hematoma. No calvarial fracture or suspicious osseous lesion. Sinuses/Orbits: Paranasal sinuses and mastoid air cells are predominantly clear. Orbital structures are unremarkable aside from prior lens extractions. Other: None IMPRESSION: Extensive patient motion artifact limits the diagnostic utility of this examination. Small peripheral abnormalities may be obscured. No definite acute intracranial abnormality is evident within the limitations of this examination detailed above. Prior right frontotemporal craniotomy. Mild parenchymal volume loss and chronic white matter changes. Electronically Signed: By: Lovena Le M.D. On: 07/18/2019 20:50    EKG: Independently reviewed.  Assessment/Plan Principal Problem:   Acute lower UTI Active Problems:   Essential hypertension   Acute metabolic encephalopathy   UTI (urinary tract infection)   Cough    1. Acute metabolic encephalopathy - 1. Delirium on top of prior TBI due either to UTI and/or possible CAP 2. UTI - 1. Rocephin 2. Culture from PCP pending 3. Cough - developed while in ED 1. ? R sided PNA? 2. COVID pending 3. Will add azithromycin empirically to the rocephin shes already getting 4. Repeat CXR in AM 5. Will hold off on further fluids for the moment given someone has "diastolic CHF" in pt chart.  Got 1L already tonight. 4. HTN - 1. Cont home BP meds  DVT prophylaxis: Lovenox Code Status: DNR - confirmed with daughter Family Communication: Daughter at bedside Disposition Plan: TBD Consults called: None Admission status: Place in [place]     Waleska Buttery, [place] Hospitalists  How to contact the [facility] Attending or Consulting provider [place] or covering provider during after hours [place], for this patient?  1. Check the care team in [facility] and look for a)  attending/consulting [facility] provider listed and b) the [facility] team listed 2. Log into [email or web address]  Amion Physician Scheduling and messaging for groups and whole hospitals  On call and physician scheduling software for group practices, residents, hospitalists and other medical providers for call, clinic, rotation and shift schedules. On[employer] is a hospital-wide system for scheduling doctors and paging doctors on call. EasyPlot is for scientific plotting and data analysis.  [email or web address]  and use [place]'s universal password to access. If you do not have  the password, please contact the hospital operator.  3. Locate the [facility] provider you are looking for under [facility] and page to a number that you can be directly reached. 4. If you still have difficulty reaching the provider, please page the [facility] (Director on Call) for the Hospitalists listed on amion for assistance.  07/18/2019, 9:25 PM

## 2019-07-18 NOTE — ED Triage Notes (Signed)
Arrives via EMS from home, patient got dx with a UTI today at her MD office, was coming back from the appointment, walking into home and had a witnessed fall. Family reports LOC for about 1 min. Minor skin tear to L lower leg, hx of TBI

## 2019-07-18 NOTE — ED Notes (Signed)
Pt sitting up in bed. Family at bedside. Full monitor on. COVID swab performed. Will continue to monitor.

## 2019-07-18 NOTE — Patient Instructions (Signed)
Urinary Tract Infection, Adult A urinary tract infection (UTI) is an infection of any part of the urinary tract. The urinary tract includes:  The kidneys.  The ureters.  The bladder.  The urethra. These organs make, store, and get rid of pee (urine) in the body. What are the causes? This is caused by germs (bacteria) in your genital area. These germs grow and cause swelling (inflammation) of your urinary tract. What increases the risk? You are more likely to develop this condition if:  You have a small, thin tube (catheter) to drain pee.  You cannot control when you pee or poop (incontinence).  You are female, and: ? You use these methods to prevent pregnancy:  A medicine that kills sperm (spermicide).  A device that blocks sperm (diaphragm). ? You have low levels of a female hormone (estrogen). ? You are pregnant.  You have genes that add to your risk.  You are sexually active.  You take antibiotic medicines.  You have trouble peeing because of: ? A prostate that is bigger than normal, if you are female. ? A blockage in the part of your body that drains pee from the bladder (urethra). ? A kidney stone. ? A nerve condition that affects your bladder (neurogenic bladder). ? Not getting enough to drink. ? Not peeing often enough.  You have other conditions, such as: ? Diabetes. ? A weak disease-fighting system (immune system). ? Sickle cell disease. ? Gout. ? Injury of the spine. What are the signs or symptoms? Symptoms of this condition include:  Needing to pee right away (urgently).  Peeing often.  Peeing small amounts often.  Pain or burning when peeing.  Blood in the pee.  Pee that smells bad or not like normal.  Trouble peeing.  Pee that is cloudy.  Fluid coming from the vagina, if you are female.  Pain in the belly or lower back. Other symptoms include:  Throwing up (vomiting).  No urge to eat.  Feeling mixed up (confused).  Being tired  and grouchy (irritable).  A fever.  Watery poop (diarrhea). How is this treated? This condition may be treated with:  Antibiotic medicine.  Other medicines.  Drinking enough water. Follow these instructions at home:  Medicines  Take over-the-counter and prescription medicines only as told by your doctor.  If you were prescribed an antibiotic medicine, take it as told by your doctor. Do not stop taking it even if you start to feel better. General instructions  Make sure you: ? Pee until your bladder is empty. ? Do not hold pee for a long time. ? Empty your bladder after sex. ? Wipe from front to back after pooping if you are a female. Use each tissue one time when you wipe.  Drink enough fluid to keep your pee pale yellow.  Keep all follow-up visits as told by your doctor. This is important. Contact a doctor if:  You do not get better after 1-2 days.  Your symptoms go away and then come back. Get help right away if:  You have very bad back pain.  You have very bad pain in your lower belly.  You have a fever.  You are sick to your stomach (nauseous).  You are throwing up. Summary  A urinary tract infection (UTI) is an infection of any part of the urinary tract.  This condition is caused by germs in your genital area.  There are many risk factors for a UTI. These include having a small, thin   tube to drain pee and not being able to control when you pee or poop.  Treatment includes antibiotic medicines for germs.  Drink enough fluid to keep your pee pale yellow. This information is not intended to replace advice given to you by your health care provider. Make sure you discuss any questions you have with your health care provider. Document Revised: 03/08/2018 Document Reviewed: 09/28/2017 Elsevier Patient Education  2020 [employer]  

## 2019-07-18 NOTE — Progress Notes (Signed)
Patient ID: Deanna [name], female    DOB: Aug 03, 1929  Age: 86 y.o. MRN: [id number]    Subjective:  Subjective  HPI Deanna [name] presents with her daughter for strong urine odor and she developed congestion on the way over here per daughter  No fevers --- pt c/o laryngitis as well  + nasal congestion   Review of Systems  Constitutional: Negative for appetite change, diaphoresis, fatigue and unexpected weight change.  HENT: Positive for congestion.   Eyes: Negative for pain, redness and visual disturbance.  Respiratory: Positive for wheezing. Negative for cough, chest tightness and shortness of breath.   Cardiovascular: Negative for chest pain, palpitations and leg swelling.  Endocrine: Negative for cold intolerance, heat intolerance, polydipsia, polyphagia and polyuria.  Genitourinary: Positive for dysuria and frequency. Negative for difficulty urinating, vaginal discharge and vaginal pain.  Neurological: Negative for dizziness, light-headedness, numbness and headaches.    History Past Medical History:  Diagnosis Date  . AAA (abdominal aortic aneurysm) [facility]) 2011   Dr Early repaired this  . Acute ischemic colitis ([place]) 03/06/2012  . Anemia    patient denies   . Arthritis    bilateral hands and knees  . Bleeding ulcer 1980s   H. Pylori  . Blood transfusion without reported diagnosis   . Cancer ([place])    hx of skin cancer on face   . Cataract    In the past  . Chronic urinary tract infection   . Cirrhosis, biliary ([place])    liver bx 01/2008  . Diastolic CHF ([place]) [id number]  . Epistaxis   . Fatigue   . GERD (gastroesophageal reflux disease)   . HLD (hyperlipidemia)   . HTN (hypertension)   . Hypertension   . Leg pain    bilateral  . OSA (obstructive sleep apnea)    mild on ss of 12/2008  . Pneumonia    hx of years ago   . Short-term memory loss    related to trauma of 08/2015   . Subdural hematoma ([place]) 08/2015  . Thoracic aortic aneurysm [facility])     She has a past  surgical history that includes Iliac artery - femoral artery bypass graft (2011); Cataract extraction; Breast biopsy; Colonoscopy (03/08/2012); Abdominal aortic aneurysm repair (02/01/2010); ERCP (N/A, 04/20/2013); Craniotomy; Cystoscopy with ureteroscopy, stone basketry and stent placement (Right, 11/05/2015); and Cystoscopy with holmium laser lithotripsy (Right, 11/05/2015).   Her family history includes Cancer in her brother.She reports that she quit smoking about 9 years ago. Her smoking use included cigarettes. She quit after 52.00 years of use. She has never used smokeless tobacco. She reports that she does not drink alcohol or use drugs.  Current Outpatient Medications on File Prior to Visit  Medication Sig Dispense Refill  . amLODipine (NORVASC) 5 MG tablet TAKE 1 TABLET (5 MG TOTAL) BY MOUTH DAILY. CALL TO SCHEDULE APPOINTMENT 90 tablet 0  . azithromycin (ZITHROMAX Z-PAK) 250 MG tablet As directed 6 each 0  . benzonatate (TESSALON) 200 MG capsule Take 1 capsule (200 mg total) by mouth 2 (two) times daily as needed for cough. 20 capsule 0  . docusate sodium (COLACE) 100 MG capsule Take 100 mg by mouth daily.    . fenofibrate 160 MG tablet TAKE 1 TABLET BY MOUTH EVERY DAY 90 tablet 1  . losartan (COZAAR) 100 MG tablet TAKE 1 TABLET DAILY 90 tablet 1  . NONFORMULARY OR COMPOUNDED [facility] bed Dx dementia , hx subdural hemorrhage 1 each 0  . NONFORMULARY OR COMPOUNDED  ITEM In and out cath  # 1   Dx mental status change, incontinence of urine 1 each 1  . nystatin cream (MYCOSTATIN) APPLY TO AFFECTED AREA TWICE A DAY 120 g 0   No current facility-administered medications on file prior to visit.     Objective:  Objective  Physical Exam Vitals and nursing note reviewed.  Constitutional:      Appearance: She is well-developed.  HENT:     Head: Normocephalic and atraumatic.     Nose: Rhinorrhea present.     Right Sinus: Maxillary sinus tenderness and frontal sinus tenderness present.      Left Sinus: Maxillary sinus tenderness and frontal sinus tenderness present.  Eyes:     Conjunctiva/sclera: Conjunctivae normal.  Neck:     Thyroid: No thyromegaly.     Vascular: No carotid bruit or JVD.  Cardiovascular:     Rate and Rhythm: Normal rate and regular rhythm.     Heart sounds: Normal heart sounds. No murmur.  Pulmonary:     Effort: Pulmonary effort is normal. No respiratory distress.     Breath sounds: Normal breath sounds. No wheezing or rales.  Chest:     Chest wall: No tenderness.  Musculoskeletal:     Cervical back: Normal range of motion and neck supple.  Neurological:     Mental Status: She is alert and oriented to person, place, and time.    BP 112/60 (BP Location: Right Arm, Patient Position: Sitting, Cuff Size: Large)   Pulse 88  Wt Readings from Last 3 Encounters:  06/12/18 222 lb (100.7 kg)  09/26/17 220 lb 3.2 oz (99.9 kg)  05/12/17 217 lb (98.4 kg)     Lab Results  Component Value Date   WBC 7.7 06/12/2018   HGB 13.9 06/12/2018   HCT 42.9 06/12/2018   PLT 301.0 06/12/2018   GLUCOSE 108 (H) 06/12/2018   CHOL 165 09/26/2017   TRIG 218.0 (H) 09/26/2017   HDL 26.20 (L) 09/26/2017   LDLDIRECT 97.0 09/26/2017   LDLCALC 82 05/12/2017   ALT 10 06/12/2018   AST 14 06/12/2018   NA 140 06/12/2018   K 4.5 06/12/2018   CL 103 06/12/2018   CREATININE 0.92 06/12/2018   BUN 20 06/12/2018   CO2 28 06/12/2018   TSH 3.97 09/26/2017   INR 1.07 11/03/2015   HGBA1C 5.5 06/24/2011    DG Chest 2 View  Result Date: 06/12/2018 CLINICAL DATA:  Cough and congestion for 1 week. EXAM: CHEST - 2 VIEW COMPARISON:  CT chest, abdomen and pelvis 06/09/2016. PA and lateral chest 12/23/2015. FINDINGS: The lungs are emphysematous but clear. Heart size is normal. Aortic atherosclerosis and tortuosity of the aorta are noted. The appearance of the aorta is unchanged. No acute or focal bony abnormality. IMPRESSION: No acute disease. Atherosclerosis. Configuration of the  thoracic aorta is unchanged. Note is made the patient has an aortic aneurysm based on prior CT. Emphysema. Electronically Signed   By: Inge Rise M.D.   On: 06/12/2018 11:10     Assessment & Plan:  Plan  I am having Deanna [name] start on amoxicillin-clavulanate and fluticasone. I am also having her maintain her docusate sodium, NONFORMULARY OR COMPOUNDED ITEM, NONFORMULARY OR COMPOUNDED ITEM, azithromycin, benzonatate, fenofibrate, losartan, nystatin cream, and amLODipine.  Meds ordered this encounter  Medications  . amoxicillin-clavulanate (AUGMENTIN) 875-125 MG tablet    Sig: Take 1 tablet by mouth 2 (two) times daily.    Dispense:  20 tablet    Refill:  0  . fluticasone (FLONASE) 50 MCG/ACT nasal spray    Sig: Place 2 sprays into both nostrils daily.    Dispense:  16 g    Refill:  6    Problem List Items Addressed This Visit      Unprioritized   Abnormal urine odor - Primary   Relevant Orders   POCT Urinalysis Dipstick (Automated) (Completed)   Urine Culture   Acute non-recurrent pansinusitis    abx and flonase D/w daughter importance of calling about symptoms ---if pt does not improve we will set up app at covid clinic       Relevant Medications   amoxicillin-clavulanate (AUGMENTIN) 875-125 MG tablet   fluticasone (FLONASE) 50 MCG/ACT nasal spray   Infection of urinary tract    abx per orders Recheck 2 weeks  Culture pending       Relevant Medications   amoxicillin-clavulanate (AUGMENTIN) 875-125 MG tablet   Other Relevant Orders   Urine Culture    Other Visit Diagnoses    Leukocytes in urine       Relevant Orders   Urine Culture      Follow-up: Return if symptoms worsen or fail to improve, for recheck urine in 2 weeks.  Ann Held, DO

## 2019-07-18 NOTE — Assessment & Plan Note (Signed)
abx and flonase D/w daughter importance of calling about symptoms ---if pt does not improve we will set up app at covid clinic

## 2019-07-19 ENCOUNTER — Observation Stay (HOSPITAL_COMMUNITY): Payer: Medicare Other

## 2019-07-19 ENCOUNTER — Encounter (HOSPITAL_COMMUNITY): Payer: Self-pay | Admitting: Internal Medicine

## 2019-07-19 DIAGNOSIS — I10 Essential (primary) hypertension: Secondary | ICD-10-CM | POA: Diagnosis not present

## 2019-07-19 DIAGNOSIS — Z888 Allergy status to other drugs, medicaments and biological substances status: Secondary | ICD-10-CM | POA: Diagnosis not present

## 2019-07-19 DIAGNOSIS — D649 Anemia, unspecified: Secondary | ICD-10-CM | POA: Diagnosis present

## 2019-07-19 DIAGNOSIS — F0390 Unspecified dementia without behavioral disturbance: Secondary | ICD-10-CM | POA: Diagnosis present

## 2019-07-19 DIAGNOSIS — G9341 Metabolic encephalopathy: Secondary | ICD-10-CM | POA: Diagnosis present

## 2019-07-19 DIAGNOSIS — Z79899 Other long term (current) drug therapy: Secondary | ICD-10-CM | POA: Diagnosis not present

## 2019-07-19 DIAGNOSIS — I712 Thoracic aortic aneurysm, without rupture: Secondary | ICD-10-CM | POA: Diagnosis present

## 2019-07-19 DIAGNOSIS — R05 Cough: Secondary | ICD-10-CM | POA: Diagnosis present

## 2019-07-19 DIAGNOSIS — R627 Adult failure to thrive: Secondary | ICD-10-CM | POA: Diagnosis present

## 2019-07-19 DIAGNOSIS — K5900 Constipation, unspecified: Secondary | ICD-10-CM | POA: Diagnosis present

## 2019-07-19 DIAGNOSIS — K219 Gastro-esophageal reflux disease without esophagitis: Secondary | ICD-10-CM | POA: Diagnosis present

## 2019-07-19 DIAGNOSIS — Z20822 Contact with and (suspected) exposure to covid-19: Secondary | ICD-10-CM | POA: Diagnosis present

## 2019-07-19 DIAGNOSIS — J690 Pneumonitis due to inhalation of food and vomit: Secondary | ICD-10-CM | POA: Diagnosis present

## 2019-07-19 DIAGNOSIS — N3000 Acute cystitis without hematuria: Secondary | ICD-10-CM | POA: Diagnosis present

## 2019-07-19 DIAGNOSIS — Z87891 Personal history of nicotine dependence: Secondary | ICD-10-CM | POA: Diagnosis not present

## 2019-07-19 DIAGNOSIS — N390 Urinary tract infection, site not specified: Secondary | ICD-10-CM | POA: Diagnosis present

## 2019-07-19 DIAGNOSIS — K743 Primary biliary cirrhosis: Secondary | ICD-10-CM | POA: Diagnosis present

## 2019-07-19 DIAGNOSIS — E785 Hyperlipidemia, unspecified: Secondary | ICD-10-CM | POA: Diagnosis present

## 2019-07-19 DIAGNOSIS — J449 Chronic obstructive pulmonary disease, unspecified: Secondary | ICD-10-CM | POA: Diagnosis present

## 2019-07-19 DIAGNOSIS — W19XXXA Unspecified fall, initial encounter: Secondary | ICD-10-CM | POA: Diagnosis present

## 2019-07-19 DIAGNOSIS — Y92009 Unspecified place in unspecified non-institutional (private) residence as the place of occurrence of the external cause: Secondary | ICD-10-CM | POA: Diagnosis not present

## 2019-07-19 DIAGNOSIS — G4733 Obstructive sleep apnea (adult) (pediatric): Secondary | ICD-10-CM | POA: Diagnosis present

## 2019-07-19 DIAGNOSIS — Z66 Do not resuscitate: Secondary | ICD-10-CM | POA: Diagnosis present

## 2019-07-19 DIAGNOSIS — R200 Anesthesia of skin: Secondary | ICD-10-CM | POA: Diagnosis present

## 2019-07-19 DIAGNOSIS — I110 Hypertensive heart disease with heart failure: Secondary | ICD-10-CM | POA: Diagnosis present

## 2019-07-19 DIAGNOSIS — I5032 Chronic diastolic (congestive) heart failure: Secondary | ICD-10-CM | POA: Diagnosis present

## 2019-07-19 DIAGNOSIS — Z8042 Family history of malignant neoplasm of prostate: Secondary | ICD-10-CM | POA: Diagnosis not present

## 2019-07-19 DIAGNOSIS — Z87820 Personal history of traumatic brain injury: Secondary | ICD-10-CM | POA: Diagnosis not present

## 2019-07-19 LAB — CBC
HCT: 41.2 % (ref 36.0–46.0)
Hemoglobin: 12.6 g/dL (ref 12.0–15.0)
MCH: 28.8 pg (ref 26.0–34.0)
MCHC: 30.6 g/dL (ref 30.0–36.0)
MCV: 94.3 fL (ref 80.0–100.0)
Platelets: 313 10*3/uL (ref 150–400)
RBC: 4.37 MIL/uL (ref 3.87–5.11)
RDW: 15.6 % — ABNORMAL HIGH (ref 11.5–15.5)
WBC: 14.9 10*3/uL — ABNORMAL HIGH (ref 4.0–10.5)
nRBC: 0 % (ref 0.0–0.2)

## 2019-07-19 LAB — BASIC METABOLIC PANEL
Anion gap: 13 (ref 5–15)
BUN: 18 mg/dL (ref 8–23)
CO2: 18 mmol/L — ABNORMAL LOW (ref 22–32)
Calcium: 8.9 mg/dL (ref 8.9–10.3)
Chloride: 108 mmol/L (ref 98–111)
Creatinine, Ser: 0.98 mg/dL (ref 0.44–1.00)
GFR calc Af Amer: >60 mL/min (ref >60)
GFR calc non Af Amer: 52 mL/min — ABNORMAL LOW (ref >60)
Glucose, Bld: 103 mg/dL — ABNORMAL HIGH (ref 70–99)
Potassium: 4.3 mmol/L (ref 3.5–5.1)
Sodium: 139 mmol/L (ref 135–145)

## 2019-07-19 LAB — URINE CULTURE
MICRO NUMBER:: 10367932
SPECIMEN QUALITY:: ADEQUATE

## 2019-07-19 LAB — SARS CORONAVIRUS 2 (TAT 6-24 HRS): SARS Coronavirus 2: NEGATIVE

## 2019-07-19 MED ORDER — IBUPROFEN 200 MG PO TABS
200.0000 mg | ORAL_TABLET | Freq: Once | ORAL | Status: AC
  Administered 2019-07-19: 200 mg via ORAL
  Filled 2019-07-19: qty 1

## 2019-07-19 MED ORDER — AMLODIPINE BESYLATE 5 MG PO TABS
5.0000 mg | ORAL_TABLET | Freq: Every day | ORAL | Status: DC
  Administered 2019-07-19 – 2019-07-25 (×6): 5 mg via ORAL
  Filled 2019-07-19 (×6): qty 1

## 2019-07-19 NOTE — Progress Notes (Signed)
PROGRESS NOTE    Gea Halbrook  [id number] DOB: October 07, 1929 DOA: 07/18/2019 PCP: Ann Held, DO  Brief Narrative: HPI: Synda Buccilli is a 86 y.o. female with medical history significant of TBI prior SDH/SAH requiring craniotomy, HTN. -Presented to ED for evaluation of UTI and fall.  Foul smelling urine, went to PCP 4/15, dx with UTI, culture still pending, augmentin ordered but not yet started.  Went home, had fall at home and the came to the ED ED Course: CT head without acute findings.  CXR ? R sided PNA.  WBC 13k.  Assessment & Plan:   Metabolic encephalopathy -Likely secondary to UTI and/or aspiration pneumonia -Continue ceftriaxone and azithromycin -Follow-up urine cultures -Repeat chest x-ray  UTI -Suspected, urinalysis abnormal, unable to assess symptomatology given cognitive deficits -Continue ceftriaxone and follow-up urine cultures  Cough, ? Aspiration -Repeat chest x-ray -SLP evaluation  HTN -Stable, continue home regimen  Dementia, prior TBI -With ongoing cognitive and functional decline -Daughter considering long-term placement -Social work consulted  Aortic aneurysm -Seen by vascular surgery in the past, not felt to be a surgical candidate  DVT prophylaxis: lovenox Code Status: DNR Family Communication: Discussed with daughter Levada Dy Disposition Plan: Patient is from home with her daughter anticipate discharge is to SNF, patient is still being worked up not medically stable for discharge at this time    Consultants:     Procedures:   Antimicrobials:    Subjective: -Active cough, tired  Objective: Vitals:   07/18/19 2208 07/19/19 0216 07/19/19 0609 07/19/19 1315  BP: (!) 141/80 121/82 127/82 94/79  Pulse: (!) 103 92 92 68  Resp: 16 16 16 18   Temp: 98.3 F (36.8 C) 98 F (36.7 C) 98.4 F (36.9 C) 97.8 F (36.6 C)  TempSrc: Oral Oral Axillary Oral  SpO2: 97% 98% 94% 96%  Weight:      Height:        Intake/Output Summary  (Last 24 hours) at 07/19/2019 1421 Last data filed at 07/19/2019 [id number] Gross per 24 hour  Intake 100 ml  Output 200 ml  Net -100 ml   Filed Weights   07/18/19 1654  Weight: 100.7 kg    Examination:  General exam: Elderly obese pleasant female, laying in bed, somnolent but arousable and interactive, oriented to self and partly to place HEENT, old surgical scars Respiratory system: Rhonchi at the right base Cardiovascular system: S1 & S2 heard, RRR. Gastrointestinal system: Abdomen is nondistended, soft and nontender.Normal bowel sounds heard. Central nervous system: Awake alert, oriented to self and partly to place Extremities: Trace edema Skin: No rashes, lesions or ulcers Psychiatry:  Mood & affect appropriate.     Data Reviewed:   CBC: Recent Labs  Lab 07/18/19 1945 07/19/19 0516  WBC 13.4* 14.9*  NEUTROABS 11.6*  --   HGB 11.2* 12.6  HCT 36.0 41.2  MCV 92.5 94.3  PLT 266 [id number]   Basic Metabolic Panel: Recent Labs  Lab 07/18/19 1739 07/19/19 0516  NA 139 139  K 4.7 4.3  CL 104 108  CO2 24 18*  GLUCOSE 120* 103*  BUN 19 18  CREATININE 1.12* 0.98  CALCIUM 9.1 8.9   GFR: Estimated Creatinine Clearance: 51.1 mL/min (by C-G formula based on SCr of 0.98 mg/dL). Liver Function Tests: No results for input(s): AST, ALT, ALKPHOS, BILITOT, PROT, ALBUMIN in the last 168 hours. No results for input(s): LIPASE, AMYLASE in the last 168 hours. No results for input(s): AMMONIA in the last 168 hours. Coagulation Profile:  No results for input(s): INR, PROTIME in the last 168 hours. Cardiac Enzymes: No results for input(s): CKTOTAL, CKMB, CKMBINDEX, TROPONINI in the last 168 hours. BNP (last 3 results) No results for input(s): PROBNP in the last 8760 hours. HbA1C: No results for input(s): HGBA1C in the last 72 hours. CBG: No results for input(s): GLUCAP in the last 168 hours. Lipid Profile: No results for input(s): CHOL, HDL, LDLCALC, TRIG, CHOLHDL, LDLDIRECT in the  last 72 hours. Thyroid Function Tests: No results for input(s): TSH, T4TOTAL, FREET4, T3FREE, THYROIDAB in the last 72 hours. Anemia Panel: No results for input(s): VITAMINB12, FOLATE, FERRITIN, TIBC, IRON, RETICCTPCT in the last 72 hours. Urine analysis:    Component Value Date/Time   COLORURINE ORANGE (A) 04/20/2013 1515   APPEARANCEUR CLOUDY (A) 04/20/2013 1515   LABSPEC >1.046 (H) 04/20/2013 1515   PHURINE 5.5 04/20/2013 1515   GLUCOSEU NEGATIVE 04/20/2013 1515   HGBUR TRACE (A) 04/20/2013 1515   HGBUR negative 06/15/2010 0943   BILIRUBINUR negative 07/18/2019 1448   KETONESUR NEGATIVE 04/20/2013 1515   PROTEINUR Positive (A) 07/18/2019 1448   PROTEINUR NEGATIVE 04/20/2013 1515   UROBILINOGEN 0.2 07/18/2019 1448   UROBILINOGEN 2.0 (H) 04/20/2013 1515   NITRITE postive 07/18/2019 1448   NITRITE POSITIVE (A) 04/20/2013 1515   LEUKOCYTESUR Large (3+) (A) 07/18/2019 1448   Sepsis Labs: @LABRCNTIP (procalcitonin:4,lacticidven:4)  ) Recent Results (from the past 240 hour(s))  SARS CORONAVIRUS 2 (TAT 6-24 HRS) Nasopharyngeal Nasopharyngeal Swab     Status: None   Collection Time: 07/18/19  8:35 PM   Specimen: Nasopharyngeal Swab  Result Value Ref Range Status   SARS Coronavirus 2 NEGATIVE NEGATIVE Final    Comment: (NOTE) SARS-CoV-2 target nucleic acids are NOT DETECTED. The SARS-CoV-2 RNA is generally detectable in upper and lower respiratory specimens during the acute phase of infection. Negative results do not preclude SARS-CoV-2 infection, do not rule out co-infections with other pathogens, and should not be used as the sole basis for treatment or other patient management decisions. Negative results must be combined with clinical observations, patient history, and epidemiological information. The expected result is Negative. Fact Sheet for Patients: SugarRoll.be Fact Sheet for Healthcare  Providers: https://www.woods-mathews.com/ This test is not yet approved or cleared by the [place] FDA and  has been authorized for detection and/or diagnosis of SARS-CoV-2 by FDA under an Emergency Use Authorization (EUA). This EUA will remain  in effect (meaning this test can be used) for the duration of the COVID-19 declaration under Section 56 4(b)(1) of the Act, 21 U.S.C. section 360bbb-3(b)(1), unless the authorization is terminated or revoked sooner. Performed at [facility], [place] [address] [place], [place] [address]          Radiology Studies: DG Chest 2 View  Result Date: 07/19/2019 CLINICAL DATA:  Cough. EXAM: CHEST - 2 VIEW COMPARISON:  July 19, 2019. June 12, 2018. FINDINGS: Mild cardiomegaly is noted. There is again noted aneurysmal dilatation of the thoracic aortic arch and descending thoracic aorta. No pneumothorax or pleural effusion is noted. Stable mild probable left basilar scarring is noted. No acute pulmonary abnormality is noted. Bony thorax is unremarkable. IMPRESSION: Stable aneurysmal dilatation of thoracic aortic arch and descending thoracic aorta; CT angiography of the chest is recommended to allow comparison to prior study of 2018. Stable mild left basilar scarring. No other cardiopulmonary abnormality seen. Electronically Signed   By: Marijo Conception M.D.   On: 07/19/2019 11:42   DG Chest 2 View  Result  Date: 07/18/2019 CLINICAL DATA:  Cough, UTI, witnessed fall EXAM: CHEST - 2 VIEW COMPARISON:  Radiograph 06/12/2018, CTA 06/09/2016 FINDINGS: Lung volumes are low with some streaky basilar atelectatic changes. More patchy opacities noted in right lung base poorly visualized on lateral radiograph which could reflect some atelectatic change, less likely consolidation. Central vascular crowding is noted. Pulmonary vascularity remains fairly well-defined however without convincing features of edema. No pneumothorax or visible effusion.  Cardiac size is stable from priors. There is a calcified and tortuous aorta with fusiform aneurysmal dilatation of the distal aortic arch to 5.8 cm best appreciated on lateral radiograph, not significantly increased in size from comparison radiograph 06/12/2018. In The osseous structures appear diffusely demineralized which may limit detection of small or nondisplaced fractures. No acute osseous or soft tissue abnormality. Degenerative changes are present in the imaged spine and shoulders. Telemetry leads overlie the chest. IMPRESSION: Low lung volumes with streaky bibasilar atelectasis. Patchy right basilar opacities poorly visualized on lateral radiograph which could reflect some atelectatic change, less likely consolidation. No acute traumatic findings in the chest. 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 Of note patient should be receiving routine aneurysmal surveillance which does not appear to have been performed since 06/09/2016. Consider outpatient CT or MR angiography of the chest. Electronically Signed   By: Lovena Le M.D.   On: 07/18/2019 18:56   DG Tibia/Fibula Left  Result Date: 07/18/2019 CLINICAL DATA:  Pain following fall EXAM: LEFT TIBIA AND FIBULA - 2 VIEW COMPARISON:  None. FINDINGS: Frontal and lateral views obtained. No demonstrable fracture or dislocation. Mild osteoarthritic change in the knee and ankle regions. There is popliteal artery atherosclerotic calcification. IMPRESSION: No fracture or dislocation. Osteoarthritic change in the left knee and ankle regions. Left popliteal artery atherosclerosis. Electronically Signed   By: Lowella Grip III M.D.   On: 07/18/2019 18:51   CT Head Wo Contrast  Addendum Date: 07/18/2019   ADDENDUM REPORT: 07/18/2019 20:59 ADDENDUM: The results and limitations of this exam were called by telephone at the time of interpretation  on 07/18/2019 at 8:49 pm to provider [facility] , who verbally acknowledged these results. Electronically Signed   By: Lovena Le M.D.   On: 07/18/2019 20:59   Result Date: 07/18/2019 CLINICAL DATA:  Fall, loss of consciousness EXAM: CT HEAD WITHOUT CONTRAST TECHNIQUE: Contiguous axial images were obtained from the base of the skull through the vertex without intravenous contrast. COMPARISON:  CT head 09/26/2017 FINDINGS: Brain: There is extensive patient motion artifact which limits the diagnostic utility of this examination and may obscure small peripheral bleeds or extra-axial collections. This is despite multiple attempts at acquisition and restraint of the patient. No definite acute intracranial abnormality is seen. Specifically, there is no convincing evidence of acute infarction, hemorrhage, hydrocephalus, visible extra-axial collection or mass lesion/mass effect. Symmetric prominence of the ventricles, cisterns and sulci compatible with parenchymal volume loss. Patchy areas of white matter hypoattenuation are most compatible with chronic microvascular angiopathy. Vascular: Atherosclerotic calcification of the carotid siphons. No hyperdense vessel. Skull: Postsurgical changes from prior right frontotemporal craniotomy. No complication is evident. No focal scalp swelling or hematoma. No calvarial fracture or suspicious osseous lesion. Sinuses/Orbits: Paranasal sinuses and mastoid air cells are predominantly clear. Orbital structures are unremarkable aside from prior lens extractions. Other: None IMPRESSION: Extensive patient motion artifact limits the diagnostic utility of this examination. Small peripheral abnormalities may be obscured. No  definite acute intracranial abnormality is evident within the limitations of this examination detailed above. Prior right frontotemporal craniotomy. Mild parenchymal volume loss and chronic white matter changes. Electronically Signed: By: Lovena Le M.D. On:  07/18/2019 20:50   DG CHEST PORT 1 VIEW  Result Date: 07/19/2019 CLINICAL DATA:  86 year old female with history of cough. EXAM: PORTABLE CHEST 1 VIEW COMPARISON:  Chest x-ray 07/18/2019. FINDINGS: Lung volumes are low. No consolidative airspace disease. No pleural effusions. No pneumothorax. No pulmonary nodule or mass noted. Pulmonary vasculature and the cardiomediastinal silhouette are within normal limits. Aortic atherosclerosis. IMPRESSION: 1.  No radiographic evidence of acute cardiopulmonary disease. 2. Aortic atherosclerosis. Electronically Signed   By: Vinnie Langton M.D.   On: 07/19/2019 07:53        Scheduled Meds: . amLODipine  5 mg Oral Daily  . azithromycin  250 mg Oral Daily  . docusate sodium  100 mg Oral Daily  . enoxaparin (LOVENOX) injection  40 mg Subcutaneous Q24H  . fenofibrate  160 mg Oral Daily  . fluticasone  2 spray Each Nare Daily  . losartan  100 mg Oral Daily   Continuous Infusions: . cefTRIAXone (ROCEPHIN)  IV       LOS: 0 days    Time spent: 48min    Domenic Polite, MD [facility]  07/19/2019, 2:21 PM
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
## 2019-07-19 NOTE — Progress Notes (Signed)
Modified Barium Swallow Progress Note  Patient Details  Name: Deanna [name] MRN: [id number] Date of Birth: Apr 06, 1929  Today's Date: 07/19/2019  Modified Barium Swallow completed.  Full report located under Chart Review in the Imaging Section.  Brief recommendations include the following:  Clinical Impression Pt presents with normal oropharyngeal swallow. No oral residue, no penetration or aspiration, and no post-swallow residue. Pt tolerated trials of thin liquid via cup and straw, puree, solid texture, and barium tablet with water without difficulty. Recommend continuing regular diet and thin liquids. Results and recommendations were reviewed with pt and her 2 daughters following this study. No further ST intervention recommended at this time.  Please reconsult if needs arise.    Swallow Evaluation Recommendations  SLP Diet Recommendations: Regular solids;Thin liquid   Liquid Administration via: Cup;Straw   Medication Administration: Whole meds with liquid   Supervision: Patient able to self feed;Intermittent supervision to cue for compensatory strategies   Compensations: Slow rate;Small sips/bites   Postural Changes: Seated upright at 90 degrees   Oral Care Recommendations: Oral care BID   Phong Isenberg B. Quentin Ore, [facility], [place] Speech Language Pathologist Office: [phone] Pager: [phone]   Deanna [name] 07/19/2019,3:05 PM

## 2019-07-19 NOTE — Care Management Obs Status (Signed)
[place] NOTIFICATION   Patient Details  Name: Fedra Saturno MRN: [id number] Date of Birth: [birth year]   Medicare Observation Status Notification Given:  Yes    MahabirJuliann Pulse, RN 07/19/2019, 3:25 PM

## 2019-07-19 NOTE — Evaluation (Signed)
Clinical/Bedside Swallow Evaluation Patient Details  Name: Jullian Roylance MRN: [id number] Date of Birth: October 27, 1929  Today's Date: 07/19/2019 Time: SLP Start Time (ACUTE ONLY): 36 SLP Stop Time (ACUTE ONLY): 1255 SLP Time Calculation (min) (ACUTE ONLY): 25 min  Past Medical History:  Past Medical History:  Diagnosis Date  . AAA (abdominal aortic aneurysm) [facility]) 2011   Dr Early repaired this  . Acute ischemic colitis ([place]) 03/06/2012  . Anemia    patient denies   . Arthritis    bilateral hands and knees  . Bleeding ulcer 1980s   H. Pylori  . Blood transfusion without reported diagnosis   . Cancer ([place])    hx of skin cancer on face   . Cataract    In the past  . Chronic urinary tract infection   . Cirrhosis, biliary ([place])    liver bx 01/2008  . Diastolic CHF ([place]) [id number]  . Epistaxis   . Fatigue   . GERD (gastroesophageal reflux disease)   . HLD (hyperlipidemia)   . HTN (hypertension)   . Hypertension   . Leg pain    bilateral  . OSA (obstructive sleep apnea)    mild on ss of 12/2008  . Pneumonia    hx of years ago   . Short-term memory loss    related to trauma of 08/2015   . Subdural hematoma ([place]) 08/2015  . Thoracic aortic aneurysm [facility])    Past Surgical History:  Past Surgical History:  Procedure Laterality Date  . ABDOMINAL AORTIC ANEURYSM REPAIR  02/01/2010  . BREAST BIOPSY    . CATARACT EXTRACTION    . COLONOSCOPY  03/08/2012   Procedure: COLONOSCOPY;  Surgeon: Gatha Mayer, MD;  Location: [place];  Service: Endoscopy;  Laterality: N/A;  . CRANIOTOMY     for evacuation of subdural hematoma - 08/29/15   . CYSTOSCOPY WITH HOLMIUM LASER LITHOTRIPSY Right 11/05/2015   Procedure: CYSTOSCOPY WITH HOLMIUM LASER LITHOTRIPSY;  Surgeon: Irine Seal, MD;  Location: [facility];  Service: Urology;  Laterality: Right;  . CYSTOSCOPY WITH URETEROSCOPY, STONE BASKETRY AND STENT PLACEMENT Right 11/05/2015   Procedure: CYSTOSCOPY WITH URETEROSCOPY, STONE BASKETRY AND STENT  EXCHANGE;  Surgeon: Irine Seal, MD;  Location: [facility];  Service: Urology;  Laterality: Right;  . ERCP N/A 04/20/2013   Procedure: ENDOSCOPIC RETROGRADE CHOLANGIOPANCREATOGRAPHY (ERCP);  Surgeon: Ladene Artist, MD;  Location: [facility];  Service: Endoscopy;  Laterality: N/A;  . ILIAC ARTERY - FEMORAL ARTERY BYPASS GRAFT  2011   Dr Early   HPI:  86yo female admitted 07/18/19 with AMS, fall at home. PMH: TBI (2017), SDH/SAH s/p crani, HTN, GERD, HLD   Assessment / Plan / Recommendation Clinical Impression  Pt seen at bedside for clinical swallow evaluation. CN exam unremarkable, upper dentures, lower natural dentition. Pt does not report difficulty swallowing, however, dtr present indicates intermittent difficulty with soup. Based on report, it does not seem related to difficulty with multi-consistency textures. Pt accepted trials of thin liquid, puree, and solids. No obvious oral issues. She exhibits a cough response inconsistently, and not necessarily immediately following the swallow. Daughters indicate recent onset of this cough. Based on presentation and PMH, recommend proceeding with instrumental swallow evaluation to objectively assess swallow function and safety, and to identify least restrictive diet. Scheduled with radiology for this afternoon.    SLP Visit Diagnosis: Dysphagia, unspecified (R13.10)    Aspiration Risk  Mild aspiration risk    Diet Recommendation Pending MBS  Follow up Recommendations TBD      Frequency and Duration  pending MBS         Prognosis Prognosis for Safe Diet Advancement: Fair Barriers to Reach Goals: Cognitive deficits      Swallow Study   General Date of Onset: 07/18/19 HPI: 86yo female admitted 07/18/19 with AMS, fall at home. PMH: TBI (2017), SDH/SAH s/p crani, HTN, GERD, HLD Type of Study: Bedside Swallow Evaluation Previous Swallow Assessment: none Diet Prior to this Study: Regular;Thin liquids Temperature Spikes Noted:  No Respiratory Status: Nasal cannula History of Recent Intubation: No Behavior/Cognition: Alert;Cooperative;Distractible;Requires cueing Oral Cavity Assessment: Within Functional Limits Oral Care Completed by SLP: No Oral Cavity - Dentition: Dentures, top Vision: Functional for self-feeding Self-Feeding Abilities: Able to feed self;Needs assist;Needs set up Patient Positioning: Upright in bed Baseline Vocal Quality: Hoarse Volitional Cough: Strong Volitional Swallow: Able to elicit    Oral/Motor/Sensory Function Overall Oral Motor/Sensory Function: Within functional limits   Ice Chips Ice chips: Not tested   Thin Liquid Thin Liquid: Impaired Presentation: Straw Pharyngeal  Phase Impairments: Cough - Delayed(inconsistent cough response)    Nectar Thick Nectar Thick Liquid: Not tested   Honey Thick Honey Thick Liquid: Not tested   Puree Puree: Impaired Pharyngeal Phase Impairments: Cough - Delayed(inconsistent)   Solid     Solid: Impaired Pharyngeal Phase Impairments: Cough - Delayed(inconsistent)      Tylerjames Hoglund B. Quentin Ore, [facility], [place] Speech Language Pathologist Office: [phone] Pager: [phone]  Shonna Chock 07/19/2019,1:58 PM

## 2019-07-20 LAB — CBC
HCT: 37.5 % (ref 36.0–46.0)
Hemoglobin: 11.3 g/dL — ABNORMAL LOW (ref 12.0–15.0)
MCH: 28.8 pg (ref 26.0–34.0)
MCHC: 30.1 g/dL (ref 30.0–36.0)
MCV: 95.7 fL (ref 80.0–100.0)
Platelets: 272 10*3/uL (ref 150–400)
RBC: 3.92 MIL/uL (ref 3.87–5.11)
RDW: 15.8 % — ABNORMAL HIGH (ref 11.5–15.5)
WBC: 8.7 10*3/uL (ref 4.0–10.5)
nRBC: 0 % (ref 0.0–0.2)

## 2019-07-20 LAB — BASIC METABOLIC PANEL
Anion gap: 6 (ref 5–15)
BUN: 25 mg/dL — ABNORMAL HIGH (ref 8–23)
CO2: 25 mmol/L (ref 22–32)
Calcium: 8.6 mg/dL — ABNORMAL LOW (ref 8.9–10.3)
Chloride: 106 mmol/L (ref 98–111)
Creatinine, Ser: 1.25 mg/dL — ABNORMAL HIGH (ref 0.44–1.00)
GFR calc Af Amer: 45 mL/min — ABNORMAL LOW (ref >60)
GFR calc non Af Amer: 39 mL/min — ABNORMAL LOW (ref >60)
Glucose, Bld: 87 mg/dL (ref 70–99)
Potassium: 4.1 mmol/L (ref 3.5–5.1)
Sodium: 137 mmol/L (ref 135–145)

## 2019-07-20 NOTE — Progress Notes (Signed)
PT Cancellation Note  Patient Details Name: Deanna [name] MRN: [id number] DOB: Oct 28, 1929   Cancelled Treatment:    Reason Eval/Treat Not Completed: Fatigue/lethargy limiting ability to participate. Pt declined EOB/OOB, sat up with OT earlier on EOB. States "she just can't do it now"   [facility] 07/20/2019, 3:20 PM

## 2019-07-20 NOTE — Evaluation (Signed)
Occupational Therapy Evaluation Patient Details Name: Deanna [name] MRN: [id number] DOB: 1929/06/28 Today's Date: 07/20/2019    History of Present Illness 86 y.o. female with medical history significant of TBI prior SDH/SAH requiring craniotomy, HTN. admitted for UTI and fall.CXR ? R sided PNA.   Clinical Impression     Pt admitted with fall and UTI . Pt currently with functional limitations due to the deficits listed below (see OT Problem List).  Pt will benefit from skilled OT to increase their safety and independence with ADL and functional mobility for ADL to facilitate discharge to venue listed below.   Daughter present and stated they will need SNF.    Follow Up Recommendations  SNF    Equipment Recommendations  None recommended by OT       Precautions / Restrictions Precautions Precautions: Fall      Mobility Bed Mobility Overal bed mobility: Needs Assistance Bed Mobility: Rolling;Sidelying to Sit;Sit to Supine Rolling: Max assist Sidelying to sit: Max assist   Sit to supine: +2 for physical assistance;Max assist      Transfers          unable- would need lift to get to chair            Balance            sitting balance EOB min A.  Pt with UE support .                               ADL either performed or assessed with clinical judgement   ADL Overall ADL's : Needs assistance/impaired                                       General ADL Comments: Limited eval.  Pt sat EOB with OT with much encouragement.  Pt did perform incentive spirometer sitting EOB.  Pt somewhat beligerant during OT session.     Vision Patient Visual Report: No change from baseline              Pertinent Vitals/Pain Pain Assessment: Faces Faces Pain Scale: Hurts little more Pain Location: general grimacing with transitioing from sitting to supine Pain Intervention(s): Limited activity within patient's tolerance;Repositioned         Extremity/Trunk Assessment Upper Extremity Assessment Upper Extremity Assessment: Generalized weakness              Cognition     Overall Cognitive Status: Impaired/Different from baseline                                                Home Living Family/patient expects to be discharged to:: Private residence Living Arrangements: Children                                               OT Problem List: Decreased strength;Decreased activity tolerance;Impaired balance (sitting and/or standing);Decreased cognition;Decreased safety awareness;Decreased knowledge of precautions;Decreased knowledge of use of DME or AE;Obesity      OT Treatment/Interventions: Self-care/ADL training;Patient/family education;DME and/or AE instruction    OT Goals(Current goals can be found in the care  plan section) Acute Rehab OT Goals Patient Stated Goal: go home OT Goal Formulation: With patient Time For Goal Achievement: 07/27/19 ADL Goals Pt Will Perform Eating: with set-up;sitting Pt Will Perform Grooming: with set-up;sitting Pt Will Transfer to Toilet: with min assist;bedside commode Pt Will Perform Toileting - Clothing Manipulation and hygiene: with min assist;sitting/lateral leans;sit to/from stand  OT Frequency: Min 2X/week   Barriers to D/C: Decreased caregiver support  pts daugther said stated SNF would be needed          AM-PAC OT "6 Clicks" Daily Activity     Outcome Measure Help from another person eating meals?: A Lot Help from another person taking care of personal grooming?: A Lot Help from another person toileting, which includes using toliet, bedpan, or urinal?: Total Help from another person bathing (including washing, rinsing, drying)?: A Lot Help from another person to put on and taking off regular upper body clothing?: Total Help from another person to put on and taking off regular lower body clothing?: Total 6 Click Score: 9    End of Session Nurse Communication: Mobility status  Activity Tolerance: Treatment limited secondary to agitation Patient left: with call bell/phone within reach;in bed;with bed alarm set;with family/visitor present  OT Visit Diagnosis: Unsteadiness on feet (R26.81);Other abnormalities of gait and mobility (R26.89);Repeated falls (R29.6);Muscle weakness (generalized) (M62.81);History of falling (Z91.81)                Time: 1400-1430 OT Time Calculation (min): 30 min Charges:  OT General Charges $OT Visit: 1 Visit OT Evaluation $OT Eval Moderate Complexity: 1 Mod  Kari Baars, OT Acute Rehabilitation Services [phone] Office- [phone], Edwena Felty D 07/20/2019, 6:41 PM

## 2019-07-20 NOTE — Progress Notes (Addendum)
PROGRESS NOTE    Deanna [name]  [id number] DOB: Aug 16, 1929 DOA: 07/18/2019 PCP: Ann Held, DO  Brief Narrative: HPI: Deanna [name] is a 86 y.o. female with medical history significant of TBI prior SDH/SAH requiring craniotomy, HTN. -Presented to ED for evaluation of UTI and fall.  Foul smelling urine, went to PCP 4/15, dx with UTI, culture still pending, augmentin ordered but not yet started.  Went home, had fall at home and the came to the ED ED Course: CT head without acute findings.  CXR ? R sided PNA.  WBC 13k.  Assessment & Plan:   Metabolic encephalopathy -Likely secondary to UTI and/or aspiration pneumonia -Continue ceftriaxone and azithromycin -Clinically improving and stable now, urine cultures are pending -Repeat chest x-ray with chronic findings, COVID-19 PCR is negative  UTI -Suspected, urinalysis abnormal, unable to assess symptomatology given cognitive deficits -Continue ceftriaxone and follow-up urine cultures  Cough, ? Aspiration -Repeat chest x-ray with chronic findings, no clear infiltrate -SLP evaluation completed, mild aspiration risk noted -Aspiration precautions, continue regular diet, thin liquids  HTN -Stable, hold ARB  Dementia, prior TBI -With ongoing cognitive and functional decline -Daughter considering long-term placement -PT OT eval pending, social work consulted for rehab  Aortic aneurysm -Seen by vascular surgery in the past, not felt to be a surgical candidate  DVT prophylaxis: lovenox Code Status: DNR Family Communication: Discussed with daughter Levada Dy Disposition Plan: Patient is from home with her daughter anticipate discharge is to SNF, patient is still being worked up not medically stable for discharge at this time, date of discharge is unknown    Consultants:     Procedures:   Antimicrobials:    Subjective: -No events overnight, continues to have mild memory deficits, occasional productive  cough  Objective: Vitals:   07/19/19 0609 07/19/19 1315 07/19/19 2146 07/20/19 0547  BP: 127/82 94/79 113/78 125/84  Pulse: 92 68 71 88  Resp: 16 18 16 16   Temp: 98.4 F (36.9 C) 97.8 F (36.6 C) 99 F (37.2 C) 98.9 F (37.2 C)  TempSrc: Axillary Oral Oral Oral  SpO2: 94% 96% 93% 91%  Weight:      Height:        Intake/Output Summary (Last 24 hours) at 07/20/2019 1343 Last data filed at 07/20/2019 0544 Gross per 24 hour  Intake 340.06 ml  Output 850 ml  Net -509.94 ml   Filed Weights   07/18/19 1654  Weight: 100.7 kg    Examination:  Gen: Elderly obese pleasant female, sitting up in bed, awake alert oriented to self and partly to place HEENT: Scalp with multiple surgical scars Lungs: Rhonchi in the right base, otherwise clear CVS: S1-S2, regular rate rhythm Abd: soft, Non tender, non distended, BS present Extremities: Trace edema Skin: no new rashes Psychiatry:  Mood & affect appropriate.     Data Reviewed:   CBC: Recent Labs  Lab 07/18/19 1945 07/19/19 0516 07/20/19 0530  WBC 13.4* 14.9* 8.7  NEUTROABS 11.6*  --   --   HGB 11.2* 12.6 11.3*  HCT 36.0 41.2 37.5  MCV 92.5 94.3 95.7  PLT 266 313 [id number]   Basic Metabolic Panel: Recent Labs  Lab 07/18/19 1739 07/19/19 0516 07/20/19 0530  NA 139 139 137  K 4.7 4.3 4.1  CL 104 108 106  CO2 24 18* 25  GLUCOSE 120* 103* 87  BUN 19 18 25*  CREATININE 1.12* 0.98 1.25*  CALCIUM 9.1 8.9 8.6*   GFR: Estimated Creatinine Clearance: 40 mL/min (A) (  by C-G formula based on SCr of 1.25 mg/dL (H)). Liver Function Tests: No results for input(s): AST, ALT, ALKPHOS, BILITOT, PROT, ALBUMIN in the last 168 hours. No results for input(s): LIPASE, AMYLASE in the last 168 hours. No results for input(s): AMMONIA in the last 168 hours. Coagulation Profile: No results for input(s): INR, PROTIME in the last 168 hours. Cardiac Enzymes: No results for input(s): CKTOTAL, CKMB, CKMBINDEX, TROPONINI in the last 168  hours. BNP (last 3 results) No results for input(s): PROBNP in the last 8760 hours. HbA1C: No results for input(s): HGBA1C in the last 72 hours. CBG: No results for input(s): GLUCAP in the last 168 hours. Lipid Profile: No results for input(s): CHOL, HDL, LDLCALC, TRIG, CHOLHDL, LDLDIRECT in the last 72 hours. Thyroid Function Tests: No results for input(s): TSH, T4TOTAL, FREET4, T3FREE, THYROIDAB in the last 72 hours. Anemia Panel: No results for input(s): VITAMINB12, FOLATE, FERRITIN, TIBC, IRON, RETICCTPCT in the last 72 hours. Urine analysis:    Component Value Date/Time   COLORURINE ORANGE (A) 04/20/2013 1515   APPEARANCEUR CLOUDY (A) 04/20/2013 1515   LABSPEC >1.046 (H) 04/20/2013 1515   PHURINE 5.5 04/20/2013 1515   GLUCOSEU NEGATIVE 04/20/2013 1515   HGBUR TRACE (A) 04/20/2013 1515   HGBUR negative 06/15/2010 0943   BILIRUBINUR negative 07/18/2019 1448   KETONESUR NEGATIVE 04/20/2013 1515   PROTEINUR Positive (A) 07/18/2019 1448   PROTEINUR NEGATIVE 04/20/2013 1515   UROBILINOGEN 0.2 07/18/2019 1448   UROBILINOGEN 2.0 (H) 04/20/2013 1515   NITRITE postive 07/18/2019 1448   NITRITE POSITIVE (A) 04/20/2013 1515   LEUKOCYTESUR Large (3+) (A) 07/18/2019 1448   Sepsis Labs: @LABRCNTIP (procalcitonin:4,lacticidven:4)  ) Recent Results (from the past 240 hour(s))  Urine Culture     Status: None   Collection Time: 07/18/19  3:01 PM   Specimen: Urine  Result Value Ref Range Status   MICRO NUMBER: [id number]  Final   SPECIMEN QUALITY: Adequate  Final   Sample Source NOT GIVEN  Final   STATUS: FINAL  Final   Result:   Final    Growth of mixed flora was isolated, suggesting probable contamination. No further testing will be performed. If clinically indicated, recollection using a method to minimize contamination, with prompt transfer to Urine Culture Transport Tube, is  recommended.   SARS CORONAVIRUS 2 (TAT 6-24 HRS) Nasopharyngeal Nasopharyngeal Swab     Status: None    Collection Time: 07/18/19  8:35 PM   Specimen: Nasopharyngeal Swab  Result Value Ref Range Status   SARS Coronavirus 2 NEGATIVE NEGATIVE Final    Comment: (NOTE) SARS-CoV-2 target nucleic acids are NOT DETECTED. The SARS-CoV-2 RNA is generally detectable in upper and lower respiratory specimens during the acute phase of infection. Negative results do not preclude SARS-CoV-2 infection, do not rule out co-infections with other pathogens, and should not be used as the sole basis for treatment or other patient management decisions. Negative results must be combined with clinical observations, patient history, and epidemiological information. The expected result is Negative. Fact Sheet for Patients: SugarRoll.be Fact Sheet for Healthcare Providers: https://www.woods-mathews.com/ This test is not yet approved or cleared by the [place] FDA and  has been authorized for detection and/or diagnosis of SARS-CoV-2 by FDA under an Emergency Use Authorization (EUA). This EUA will remain  in effect (meaning this test can be used) for the duration of the COVID-19 declaration under Section 56 4(b)(1) of the Act, 21 U.S.C. section 360bbb-3(b)(1), unless the authorization is terminated or revoked sooner. Performed at  [facility], [place] [address] [place], [place] [address]   Culture, blood (routine x 2)     Status: None (Preliminary result)   Collection Time: 07/18/19 10:28 PM   Specimen: BLOOD  Result Value Ref Range Status   Specimen Description   Final    BLOOD BLOOD RIGHT WRIST Performed at [facility] [address], [place], [place] [address]    Special Requests   Final    BOTTLES DRAWN AEROBIC AND ANAEROBIC Blood Culture adequate volume Performed at [facility] [address] [place], [place] [address]    Culture   Final    NO GROWTH 1 DAY Performed at [facility], [place] [address]  [place], [place] [address]    Report Status PENDING  Incomplete  Culture, blood (routine x 2)     Status: None (Preliminary result)   Collection Time: 07/18/19 10:28 PM   Specimen: BLOOD  Result Value Ref Range Status   Specimen Description   Final    BLOOD BLOOD LEFT HAND Performed at [facility] [address], [place], [place] [address]    Special Requests   Final    BOTTLES DRAWN AEROBIC ONLY Blood Culture adequate volume Performed at [facility] [address] [place], [place] [address]    Culture   Final    NO GROWTH 1 DAY Performed at [facility], [place] [address] [place],  [address]    Report Status PENDING  Incomplete         Radiology Studies: DG Chest 2 View  Result Date: 07/19/2019 CLINICAL DATA:  Cough. EXAM: CHEST - 2 VIEW COMPARISON:  July 19, 2019. June 12, 2018. FINDINGS: Mild cardiomegaly is noted. There is again noted aneurysmal dilatation of the thoracic aortic arch and descending thoracic aorta. No pneumothorax or pleural effusion is noted. Stable mild probable left basilar scarring is noted. No acute pulmonary abnormality is noted. Bony thorax is unremarkable. IMPRESSION: Stable aneurysmal dilatation of thoracic aortic arch and descending thoracic aorta; CT angiography of the chest is recommended to allow comparison to prior study of 2018. Stable mild left basilar scarring. No other cardiopulmonary abnormality seen. Electronically Signed   By: Marijo Conception M.D.   On: 07/19/2019 11:42   DG Chest 2 View  Result Date: 07/18/2019 CLINICAL DATA:  Cough, UTI, witnessed fall EXAM: CHEST - 2 VIEW COMPARISON:  Radiograph 06/12/2018, CTA 06/09/2016 FINDINGS: Lung volumes are low with some streaky basilar atelectatic changes. More patchy opacities noted in right lung base poorly visualized on lateral radiograph which could reflect some atelectatic change, less likely consolidation. Central vascular crowding is noted.  Pulmonary vascularity remains fairly well-defined however without convincing features of edema. No pneumothorax or visible effusion. Cardiac size is stable from priors. There is a calcified and tortuous aorta with fusiform aneurysmal dilatation of the distal aortic arch to 5.8 cm best appreciated on lateral radiograph, not significantly increased in size from comparison radiograph 06/12/2018. In The osseous structures appear diffusely demineralized which may limit detection of small or nondisplaced fractures. No acute osseous or soft tissue abnormality. Degenerative changes are present in the imaged spine and shoulders. Telemetry leads overlie the chest. IMPRESSION: Low lung volumes with streaky bibasilar atelectasis. Patchy right basilar opacities poorly visualized on lateral radiograph which could reflect some atelectatic change, less likely consolidation. No acute traumatic findings in the chest. Aneurysmal dilatation of the distal aortic arch up to 5.8 cm  not significantly changed from 06/12/2018 radiograph, previously characterized on CT angiography. Of note patient should be receiving routine aneurysmal surveillance which does not appear to have been performed since 06/09/2016. Consider outpatient CT or MR angiography of the chest. Electronically Signed   By: Lovena Le M.D.   On: 07/18/2019 18:56   DG Tibia/Fibula Left  Result Date: 07/18/2019 CLINICAL DATA:  Pain following fall EXAM: LEFT TIBIA AND FIBULA - 2 VIEW COMPARISON:  None. FINDINGS: Frontal and lateral views obtained. No demonstrable fracture or dislocation. Mild osteoarthritic change in the knee and ankle regions. There is popliteal artery atherosclerotic calcification. IMPRESSION: No fracture or dislocation. Osteoarthritic change in the left knee and ankle regions. Left popliteal artery atherosclerosis. Electronically Signed   By: Lowella Grip III M.D.   On: 07/18/2019 18:51   CT Head Wo Contrast  Addendum Date: 07/18/2019    ADDENDUM REPORT: 07/18/2019 20:59 ADDENDUM: The results and limitations of this exam were called by telephone at the time of interpretation on 07/18/2019 at 8:49 pm to provider [facility] , who verbally acknowledged these results. Electronically Signed   By: Lovena Le M.D.   On: 07/18/2019 20:59   Result Date: 07/18/2019 CLINICAL DATA:  Fall, loss of consciousness EXAM: CT HEAD WITHOUT CONTRAST TECHNIQUE: Contiguous axial images were obtained from the base of the skull through the vertex without intravenous contrast. COMPARISON:  CT head 09/26/2017 FINDINGS: Brain: There is extensive patient motion artifact which limits the diagnostic utility of this examination and may obscure small peripheral bleeds or extra-axial collections. This is despite multiple attempts at acquisition and restraint of the patient. No definite acute intracranial abnormality is seen. Specifically, there is no convincing evidence of acute infarction, hemorrhage, hydrocephalus, visible extra-axial collection or mass lesion/mass effect. Symmetric prominence of the ventricles, cisterns and sulci compatible with parenchymal volume loss. Patchy areas of white matter hypoattenuation are most compatible with chronic microvascular angiopathy. Vascular: Atherosclerotic calcification of the carotid siphons. No hyperdense vessel. Skull: Postsurgical changes from prior right frontotemporal craniotomy. No complication is evident. No focal scalp swelling or hematoma. No calvarial fracture or suspicious osseous lesion. Sinuses/Orbits: Paranasal sinuses and mastoid air cells are predominantly clear. Orbital structures are unremarkable aside from prior lens extractions. Other: None IMPRESSION: Extensive patient motion artifact limits the diagnostic utility of this examination. Small peripheral abnormalities may be obscured. No definite acute intracranial abnormality is evident within the limitations of this examination detailed above. Prior right  frontotemporal craniotomy. Mild parenchymal volume loss and chronic white matter changes. Electronically Signed: By: Lovena Le M.D. On: 07/18/2019 20:50   DG CHEST PORT 1 VIEW  Result Date: 07/19/2019 CLINICAL DATA:  86 year old female with history of cough. EXAM: PORTABLE CHEST 1 VIEW COMPARISON:  Chest x-ray 07/18/2019. FINDINGS: Lung volumes are low. No consolidative airspace disease. No pleural effusions. No pneumothorax. No pulmonary nodule or mass noted. Pulmonary vasculature and the cardiomediastinal silhouette are within normal limits. Aortic atherosclerosis. IMPRESSION: 1.  No radiographic evidence of acute cardiopulmonary disease. 2. Aortic atherosclerosis. Electronically Signed   By: Vinnie Langton M.D.   On: 07/19/2019 07:53   DG Swallowing Func-Speech Pathology  Result Date: 07/19/2019 Objective Swallowing Evaluation: Type of Study: MBS-Modified Barium Swallow Study  Patient Details Name: Clorinda Ozog MRN: [id number] Date of Birth: 1929/06/10 Today's Date: 07/19/2019 Time: SLP Start Time (ACUTE ONLY): 1415 -SLP Stop Time (ACUTE ONLY): 1445 SLP Time Calculation (min) (ACUTE ONLY): 30 min Past Medical History: Past Medical History: Diagnosis Date . AAA (abdominal aortic aneurysm) (  [facility]) 2011  Dr Early repaired this . Acute ischemic colitis ([place]) 03/06/2012 . Anemia   patient denies  . Arthritis   bilateral hands and knees . Bleeding ulcer 1980s  H. Pylori . Blood transfusion without reported diagnosis  . Cancer ([place])   hx of skin cancer on face  . Cataract   In the past . Chronic urinary tract infection  . Cirrhosis, biliary ([place])   liver bx 01/2008 . Diastolic CHF ([place]) [id number] . Epistaxis  . Fatigue  . GERD (gastroesophageal reflux disease)  . HLD (hyperlipidemia)  . HTN (hypertension)  . Hypertension  . Leg pain   bilateral . OSA (obstructive sleep apnea)   mild on ss of 12/2008 . Pneumonia   hx of years ago  . Short-term memory loss   related to trauma of 08/2015  . Subdural hematoma ([place])  08/2015 . Thoracic aortic aneurysm [facility])  Past Surgical History: Past Surgical History: Procedure Laterality Date . ABDOMINAL AORTIC ANEURYSM REPAIR  02/01/2010 . BREAST BIOPSY   . CATARACT EXTRACTION   . COLONOSCOPY  03/08/2012  Procedure: COLONOSCOPY;  Surgeon: Gatha Mayer, MD;  Location: [facility];  Service: Endoscopy;  Laterality: N/A; . CRANIOTOMY    for evacuation of subdural hematoma - 08/29/15  . CYSTOSCOPY WITH HOLMIUM LASER LITHOTRIPSY Right 11/05/2015  Procedure: CYSTOSCOPY WITH HOLMIUM LASER LITHOTRIPSY;  Surgeon: Irine Seal, MD;  Location: [facility];  Service: Urology;  Laterality: Right; . CYSTOSCOPY WITH URETEROSCOPY, STONE BASKETRY AND STENT PLACEMENT Right 11/05/2015  Procedure: CYSTOSCOPY WITH URETEROSCOPY, STONE BASKETRY AND STENT EXCHANGE;  Surgeon: Irine Seal, MD;  Location: [facility];  Service: Urology;  Laterality: Right; . ERCP N/A 04/20/2013  Procedure: ENDOSCOPIC RETROGRADE CHOLANGIOPANCREATOGRAPHY (ERCP);  Surgeon: Ladene Artist, MD;  Location: [facility];  Service: Endoscopy;  Laterality: N/A; . ILIAC ARTERY - FEMORAL ARTERY BYPASS GRAFT  2011  Dr Early HPI: 86yo female admitted 07/18/19 with AMS, fall at home. PMH: TBI (2017), SDH/SAH s/p crani, HTN, GERD, HLD  Subjective: Pt seen in radiology for instrumental swallow evaluation Assessment / Plan / Recommendation CHL IP CLINICAL IMPRESSIONS 07/19/2019 Clinical Impression Pt presents with normal oropharyngeal swallow. No oral residue, no penetration or aspiration, and no post-swallow residue. Pt tolerated trials of thin liquid via cup and straw, puree, solid texture, and barium tablet with water without difficulty. Recommend continuing regular diet and thin liquids. Results and recommendations were reviewed with pt and her 2 daughters following this study. No further ST intervention recommended at this time.  Please reconsult if needs arise.  SLP Visit Diagnosis Dysphagia, oropharyngeal phase (R13.12)     Impact on safety and function Mild  aspiration risk   CHL IP TREATMENT RECOMMENDATION 07/19/2019 Treatment Recommendations No treatment recommended at this time   Prognosis 07/19/2019 Prognosis for Safe Diet Advancement Good     CHL IP DIET RECOMMENDATION 07/19/2019 SLP Diet Recommendations Regular solids;Thin liquid Liquid Administration via Cup;Straw Medication Administration Whole meds with liquid Compensations Slow rate;Small sips/bites Postural Changes Seated upright at 90 degrees   CHL IP OTHER RECOMMENDATIONS 07/19/2019   Oral Care Recommendations Oral care BID     CHL IP FOLLOW UP RECOMMENDATIONS 07/19/2019 Follow up Recommendations None      CHL IP ORAL PHASE 07/19/2019 Oral Phase WFL  CHL IP PHARYNGEAL PHASE 07/19/2019 Pharyngeal Phase WFL  CHL IP CERVICAL ESOPHAGEAL PHASE 07/19/2019 Cervical Esophageal Phase WFL Celia B. Quentin Ore, [facility], [place] Speech Language Pathologist Office: [phone] Pager: [phone] Shonna Chock 07/19/2019, 3:00 PM  Scheduled Meds: . amLODipine  5 mg Oral Daily  . azithromycin  250 mg Oral Daily  . docusate sodium  100 mg Oral Daily  . enoxaparin (LOVENOX) injection  40 mg Subcutaneous Q24H  . fenofibrate  160 mg Oral Daily  . fluticasone  2 spray Each Nare Daily   Continuous Infusions: . cefTRIAXone (ROCEPHIN)  IV Stopped (07/19/19 1858)     LOS: 1 day    Time spent: 49min  Domenic Polite, MD [facility]  07/20/2019, 1:43 PM

## 2019-07-20 NOTE — Progress Notes (Signed)
Pt with increased oxygen requirement. No c/o SOB. No distress. More congestion noted than am assessment. MD and respiratory paged. Suction as recommended with some improvement. Pt maintaining on 3L [place]. Will continue to monitor.

## 2019-07-21 ENCOUNTER — Inpatient Hospital Stay (HOSPITAL_COMMUNITY): Payer: Medicare Other

## 2019-07-21 LAB — CBC
HCT: 39.7 % (ref 36.0–46.0)
Hemoglobin: 11.9 g/dL — ABNORMAL LOW (ref 12.0–15.0)
MCH: 28.4 pg (ref 26.0–34.0)
MCHC: 30 g/dL (ref 30.0–36.0)
MCV: 94.7 fL (ref 80.0–100.0)
Platelets: 299 10*3/uL (ref 150–400)
RBC: 4.19 MIL/uL (ref 3.87–5.11)
RDW: 15.4 % (ref 11.5–15.5)
WBC: 8.5 10*3/uL (ref 4.0–10.5)
nRBC: 0 % (ref 0.0–0.2)

## 2019-07-21 LAB — BASIC METABOLIC PANEL
Anion gap: 10 (ref 5–15)
BUN: 22 mg/dL (ref 8–23)
CO2: 26 mmol/L (ref 22–32)
Calcium: 8.8 mg/dL — ABNORMAL LOW (ref 8.9–10.3)
Chloride: 102 mmol/L (ref 98–111)
Creatinine, Ser: 1.12 mg/dL — ABNORMAL HIGH (ref 0.44–1.00)
GFR calc Af Amer: 51 mL/min — ABNORMAL LOW (ref >60)
GFR calc non Af Amer: 44 mL/min — ABNORMAL LOW (ref >60)
Glucose, Bld: 89 mg/dL (ref 70–99)
Potassium: 4.1 mmol/L (ref 3.5–5.1)
Sodium: 138 mmol/L (ref 135–145)

## 2019-07-21 LAB — AMMONIA: Ammonia: 35 umol/L (ref 9–35)

## 2019-07-21 MED ORDER — POLYETHYLENE GLYCOL 3350 17 G PO PACK
17.0000 g | PACK | Freq: Every day | ORAL | Status: DC
  Administered 2019-07-21 – 2019-07-24 (×4): 17 g via ORAL
  Filled 2019-07-21 (×2): qty 1

## 2019-07-21 MED ORDER — SENNOSIDES-DOCUSATE SODIUM 8.6-50 MG PO TABS
1.0000 | ORAL_TABLET | Freq: Two times a day (BID) | ORAL | Status: DC
  Administered 2019-07-21 – 2019-07-25 (×9): 1 via ORAL
  Filled 2019-07-21 (×9): qty 1

## 2019-07-21 MED ORDER — GABAPENTIN 100 MG PO CAPS
200.0000 mg | ORAL_CAPSULE | Freq: Every day | ORAL | Status: DC
  Administered 2019-07-21 – 2019-07-24 (×4): 200 mg via ORAL
  Filled 2019-07-21 (×4): qty 2

## 2019-07-21 MED ORDER — ALUM & MAG HYDROXIDE-SIMETH 200-200-20 MG/5ML PO SUSP
30.0000 mL | Freq: Four times a day (QID) | ORAL | Status: DC | PRN
  Administered 2019-07-21 – 2019-07-23 (×3): 30 mL via ORAL
  Filled 2019-07-21 (×3): qty 30

## 2019-07-21 MED ORDER — DICLOFENAC SODIUM 1 % EX GEL
2.0000 g | Freq: Four times a day (QID) | CUTANEOUS | Status: DC
  Administered 2019-07-21 – 2019-07-25 (×14): 2 g via TOPICAL
  Filled 2019-07-21: qty 100
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
## 2019-07-21 NOTE — Evaluation (Signed)
Physical Therapy Evaluation Patient Details Name: Deanna [name] MRN: [id number] DOB: 06/28/1929 Today's Date: 07/21/2019   History of Present Illness  86 y.o. female with medical history significant of TBI prior SDH/SAH requiring craniotomy, HTN. admitted for UTI and fall.CXR ? R sided PNA.  Clinical Impression  Pt admitted with above diagnosis.  Pt amb short distances with rollator at baseline. Recent functional decline/requiring incr assist. dtr present and encouraging/assisting during session.  Recommend SNF post acute. Will follow in acute setting.   Pt currently with functional limitations due to the deficits listed below (see PT Problem List). Pt will benefit from skilled PT to increase their independence and safety with mobility to allow discharge to the venue listed below.       Follow Up Recommendations SNF    Equipment Recommendations  None recommended by PT    Recommendations for Other Services       Precautions / Restrictions Precautions Precautions: Fall Restrictions Weight Bearing Restrictions: No      Mobility  Bed Mobility Overal bed mobility: Needs Assistance Bed Mobility: Supine to Sit;Sit to Supine     Supine to sit: Mod assist;Max assist Sit to supine: +2 for physical assistance;Max assist   General bed mobility comments: assist with LEs on and off bed, assist to elevate trunk, multi-modal cues to self assist scooting, bed pad utilized to assist pt  Transfers Overall transfer level: Needs assistance Equipment used: Rolling walker (2 wheeled) Transfers: Sit to/from Stand Sit to Stand: +2 safety/equipment;+2 physical assistance;Mod assist;Max assist         General transfer comment: pt uses momentum to stand, bed ht elevated, incr time and multi-modal cues for participation. +2 to assist to achieve 3/4 stand (trunk and hips flexed)  Ambulation/Gait             General Gait Details: NT  Stairs            Wheelchair Mobility     Modified Rankin (Stroke Patients Only)       Balance Overall balance assessment: Needs assistance Sitting-balance support: Feet supported;No upper extremity supported Sitting balance-Leahy Scale: Fair       Standing balance-Leahy Scale: Poor Standing balance comment: reliant on external assist and UE support                             Pertinent Vitals/Pain Pain Assessment: Faces Faces Pain Scale: Hurts little more Pain Location: "it hurts" chest, LEs, generalized pain with any movement Pain Descriptors / Indicators: Discomfort;Grimacing;Sore Pain Intervention(s): Limited activity within patient's tolerance;Monitored during session;Repositioned    Home Living Family/patient expects to be discharged to:: Skilled nursing facility Living Arrangements: Children(lives with dtr)             Home Equipment: Gilford Rile - 4 wheels      Prior Function Level of Independence: Needs assistance   Gait / Transfers Assistance Needed: assist with short distance amb           Hand Dominance        Extremity/Trunk Assessment   Upper Extremity Assessment Upper Extremity Assessment: Generalized weakness;Defer to OT evaluation    Lower Extremity Assessment Lower Extremity Assessment: Generalized weakness       Communication      Cognition Arousal/Alertness: Awake/alert Behavior During Therapy: WFL for tasks assessed/performed Overall Cognitive Status: History of TBI Follows one step commands with incr time  General Comments: pt alert, refuses initially however agrees with dtr's encouragement. much more talkative  and interactive after sitting EOB      General Comments      Exercises     Assessment/Plan    PT Assessment Patient needs continued PT services  PT Problem List Decreased strength;Decreased range of motion;Decreased activity tolerance;Decreased balance;Decreased knowledge of use of  DME;Pain;Decreased mobility;Decreased safety awareness       PT Treatment Interventions DME instruction;Therapeutic exercise;Gait training;Functional mobility training;Therapeutic activities;Patient/family education;Balance training    PT Goals (Current goals can be found in the Care Plan section)  Acute Rehab PT Goals Patient Stated Goal: go home/family goals--rehab/SNF/possible long term placement PT Goal Formulation: With patient/family Time For Goal Achievement: 08/04/19 Potential to Achieve Goals: Fair    Frequency Min 2X/week   Barriers to discharge        Co-evaluation               AM-PAC PT "6 Clicks" Mobility  Outcome Measure Help needed turning from your back to your side while in a flat bed without using bedrails?: A Lot Help needed moving from lying on your back to sitting on the side of a flat bed without using bedrails?: A Lot Help needed moving to and from a bed to a chair (including a wheelchair)?: Total Help needed standing up from a chair using your arms (e.g., wheelchair or bedside chair)?: Total Help needed to walk in hospital room?: Total Help needed climbing 3-5 steps with a railing? : Total 6 Click Score: 8    End of Session Equipment Utilized During Treatment: Gait belt Activity Tolerance: Patient tolerated treatment well Patient left: in bed;with call bell/phone within reach;with bed alarm set;with family/visitor present Nurse Communication: Mobility status PT Visit Diagnosis: Difficulty in walking, not elsewhere classified (R26.2);Other abnormalities of gait and mobility (R26.89)    Time: 1115-1140 PT Time Calculation (min) (ACUTE ONLY): 25 min   Charges:   PT Evaluation $PT Eval Low Complexity: 1 Low PT Treatments $Therapeutic Activity: 8-22 mins        Baxter Flattery, PT   Acute Rehab Dept [facility]): [id number]   07/21/2019   [facility] 07/21/2019, 11:55 AM

## 2019-07-21 NOTE — Progress Notes (Addendum)
PROGRESS NOTE    Deanna [name]  [id number] DOB: 1929-08-03 DOA: 07/18/2019 PCP: Ann Held, DO  Brief Narrative: HPI: Deanna [name] is a 86 y.o. female with medical history significant of TBI prior SDH/SAH requiring craniotomy, HTN. -Presented to ED for evaluation of UTI and fall.  Foul smelling urine, went to PCP 4/15, dx with UTI, culture still pending, augmentin ordered but not yet started.  Went home, had fall at home and the came to the ED ED Course: CT head without acute findings.  CXR ? R sided PNA.  WBC 13k.  Assessment & Plan:   Metabolic encephalopathy -Likely secondary to UTI and/or aspiration pneumonia -Continue ceftriaxone and azithromycin -Clinically improving and stable now, urine cultures are pending -Repeat chest x-ray with chronic findings, COVID-19 PCR is negative -Improving overall, also suspect component of OSA, hypercarbia could be contributing however family does not think she would have a sleep study or wear CPAP at age 50 -Continue supportive care -PT OT eval completed yesterday, SNF recommended for rehab -Social work consulted  UTI -Suspected, urinalysis abnormal, unable to assess symptomatology given cognitive deficits -Continue ceftriaxone -Urine cultures pending  Cough, ? Aspiration -Repeat chest x-ray with chronic findings, no clear infiltrate -SLP evaluation completed, mild aspiration risk noted -Aspiration precautions, continue regular diet, thin liquids  Right foot pain, numbness -Will check x-ray given recent fall -Discussed with daughter about limited utility of extensive work-up given advanced age and dementia -trial of low dose gabapentin QHS  HTN -Stable, hold ARB  Dementia, prior TBI -With ongoing cognitive and functional decline -Daughter considering long-term placement -PT OT eval pending, social work consulted for rehab  Aortic aneurysm -Seen by vascular surgery in the past, not felt to be a surgical  candidate  History of primary biliary cirrhosis -This was reportedly diagnosed at [facility] 8 or 9 years ago -Check ammonia level and CMP  DVT prophylaxis: lovenox Code Status: DNR Family Communication: Discussed with daughter Levada Dy Disposition Plan: Patient is from home with her daughter anticipate discharge is to SNF, patient is still being worked up not medically stable for discharge at this time, date of discharge is unknown    Consultants:     Procedures:   Antimicrobials:    Subjective: -Denies to have intermittent cough, mild confusion, sleeps most of the day, easily arousable -According to daughter at bedside she reported some tingling and discomfort in her right foot, ankle  Objective: Vitals:   07/20/19 1650 07/20/19 2118 07/21/19 0540 07/21/19 1301  BP:  126/65 132/63 112/71  Pulse: 84 85 87 84  Resp:  18 18 20   Temp:  (!) 97.4 F (36.3 C) 97.9 F (36.6 C) 97.8 F (36.6 C)  TempSrc:  Oral Oral Oral  SpO2: 91% 93% 90% 90%  Weight:      Height:        Intake/Output Summary (Last 24 hours) at 07/21/2019 1434 Last data filed at 07/21/2019 0719 Gross per 24 hour  Intake 100 ml  Output 700 ml  Net -600 ml   Filed Weights   07/18/19 1654  Weight: 100.7 kg    Examination:  Gen: Elderly obese pleasant female, sitting up in bed, somnolent but easily arousable, oriented to self and place only, cognitive deficits noted HEENT: Scalp with multiple surgical scars Lungs: Rhonchi at right base, CVS: S1-S2, regular rhythm Abd: soft, Non tender, non distended, BS present Extremities: Trace edema, chronic venous stasis changes, mild discomfort with flexion extension of right ankle Skin: As above Psychiatry:  Mood & affect appropriate.     Data Reviewed:   CBC: Recent Labs  Lab 07/18/19 1945 07/19/19 0516 07/20/19 0530 07/21/19 0600  WBC 13.4* 14.9* 8.7 8.5  NEUTROABS 11.6*  --   --   --   HGB 11.2* 12.6 11.3* 11.9*  HCT 36.0 41.2 37.5 39.7  MCV 92.5  94.3 95.7 94.7  PLT 266 313 272 [id number]   Basic Metabolic Panel: Recent Labs  Lab 07/18/19 1739 07/19/19 0516 07/20/19 0530 07/21/19 0600  NA 139 139 137 138  K 4.7 4.3 4.1 4.1  CL 104 108 106 102  CO2 24 18* 25 26  GLUCOSE 120* 103* 87 89  BUN 19 18 25* 22  CREATININE 1.12* 0.98 1.25* 1.12*  CALCIUM 9.1 8.9 8.6* 8.8*   GFR: Estimated Creatinine Clearance: 44.7 mL/min (A) (by C-G formula based on SCr of 1.12 mg/dL (H)). Liver Function Tests: No results for input(s): AST, ALT, ALKPHOS, BILITOT, PROT, ALBUMIN in the last 168 hours. No results for input(s): LIPASE, AMYLASE in the last 168 hours. No results for input(s): AMMONIA in the last 168 hours. Coagulation Profile: No results for input(s): INR, PROTIME in the last 168 hours. Cardiac Enzymes: No results for input(s): CKTOTAL, CKMB, CKMBINDEX, TROPONINI in the last 168 hours. BNP (last 3 results) No results for input(s): PROBNP in the last 8760 hours. HbA1C: No results for input(s): HGBA1C in the last 72 hours. CBG: No results for input(s): GLUCAP in the last 168 hours. Lipid Profile: No results for input(s): CHOL, HDL, LDLCALC, TRIG, CHOLHDL, LDLDIRECT in the last 72 hours. Thyroid Function Tests: No results for input(s): TSH, T4TOTAL, FREET4, T3FREE, THYROIDAB in the last 72 hours. Anemia Panel: No results for input(s): VITAMINB12, FOLATE, FERRITIN, TIBC, IRON, RETICCTPCT in the last 72 hours. Urine analysis:    Component Value Date/Time   COLORURINE ORANGE (A) 04/20/2013 1515   APPEARANCEUR CLOUDY (A) 04/20/2013 1515   LABSPEC >1.046 (H) 04/20/2013 1515   PHURINE 5.5 04/20/2013 1515   GLUCOSEU NEGATIVE 04/20/2013 1515   HGBUR TRACE (A) 04/20/2013 1515   HGBUR negative 06/15/2010 0943   BILIRUBINUR negative 07/18/2019 1448   KETONESUR NEGATIVE 04/20/2013 1515   PROTEINUR Positive (A) 07/18/2019 1448   PROTEINUR NEGATIVE 04/20/2013 1515   UROBILINOGEN 0.2 07/18/2019 1448   UROBILINOGEN 2.0 (H) 04/20/2013 1515    NITRITE postive 07/18/2019 1448   NITRITE POSITIVE (A) 04/20/2013 1515   LEUKOCYTESUR Large (3+) (A) 07/18/2019 1448   Sepsis Labs: @LABRCNTIP (procalcitonin:4,lacticidven:4)  ) Recent Results (from the past 240 hour(s))  Urine Culture     Status: None   Collection Time: 07/18/19  3:01 PM   Specimen: Urine  Result Value Ref Range Status   MICRO NUMBER: [id number]  Final   SPECIMEN QUALITY: Adequate  Final   Sample Source NOT GIVEN  Final   STATUS: FINAL  Final   Result:   Final    Growth of mixed flora was isolated, suggesting probable contamination. No further testing will be performed. If clinically indicated, recollection using a method to minimize contamination, with prompt transfer to Urine Culture Transport Tube, is  recommended.   SARS CORONAVIRUS 2 (TAT 6-24 HRS) Nasopharyngeal Nasopharyngeal Swab     Status: None   Collection Time: 07/18/19  8:35 PM   Specimen: Nasopharyngeal Swab  Result Value Ref Range Status   SARS Coronavirus 2 NEGATIVE NEGATIVE Final    Comment: (NOTE) SARS-CoV-2 target nucleic acids are NOT DETECTED. The SARS-CoV-2 RNA is generally detectable in upper and lower  respiratory specimens during the acute phase of infection. Negative results do not preclude SARS-CoV-2 infection, do not rule out co-infections with other pathogens, and should not be used as the sole basis for treatment or other patient management decisions. Negative results must be combined with clinical observations, patient history, and epidemiological information. The expected result is Negative. Fact Sheet for Patients: SugarRoll.be Fact Sheet for Healthcare Providers: https://www.woods-mathews.com/ This test is not yet approved or cleared by the [place] FDA and  has been authorized for detection and/or diagnosis of SARS-CoV-2 by FDA under an Emergency Use Authorization (EUA). This EUA will remain  in effect (meaning this test can be  used) for the duration of the COVID-19 declaration under Section 56 4(b)(1) of the Act, 21 U.S.C. section 360bbb-3(b)(1), unless the authorization is terminated or revoked sooner. Performed at [facility], [place] [address] [place], [place] [address]   Culture, blood (routine x 2)     Status: None (Preliminary result)   Collection Time: 07/18/19 10:28 PM   Specimen: BLOOD  Result Value Ref Range Status   Specimen Description   Final    BLOOD BLOOD RIGHT WRIST Performed at [facility] [address], [place], [place] [address]    Special Requests   Final    BOTTLES DRAWN AEROBIC AND ANAEROBIC Blood Culture adequate volume Performed at [facility] [address], [place], [place] [address]    Culture   Final    NO GROWTH 2 DAYS Performed at [facility] [address] [place], [place] [address]    Report Status PENDING  Incomplete  Culture, blood (routine x 2)     Status: None (Preliminary result)   Collection Time: 07/18/19 10:28 PM   Specimen: BLOOD  Result Value Ref Range Status   Specimen Description   Final    BLOOD BLOOD LEFT HAND Performed at [facility] [address] [place], [place] [address]    Special Requests   Final    BOTTLES DRAWN AEROBIC ONLY Blood Culture adequate volume Performed at [facility] [address] [place], [place] [address]    Culture   Final    NO GROWTH 2 DAYS Performed at [facility] [address], [place], [place] [address]    Report Status PENDING  Incomplete         Radiology Studies: DG Ankle 2 Views Right  Result Date: 07/21/2019 CLINICAL DATA:  Fall 3 days ago, pain to lateral and medial malleolus of RIGHT ankle. History of arthritis. EXAM: RIGHT ANKLE - 2 VIEW COMPARISON:  None. FINDINGS: Osseous alignment is normal. Ankle mortise is symmetric. No fracture line or evidence of acute avulsion fracture. Well corticated  calcification underlying the medial malleolus, consistent with old injury. Visualized portions of the hindfoot and midfoot appear intact and normally aligned. Mild degenerative spurring within the midfoot. Soft tissues about the RIGHT ankle are unremarkable. IMPRESSION: 1. No acute findings. No osseous fracture or dislocation. 2. Mild degenerative spurring within the midfoot. Electronically Signed   By: Franki Cabot M.D.   On: 07/21/2019 12:45   DG Swallowing Func-Speech Pathology  Result Date: 07/19/2019 Objective Swallowing Evaluation: Type of Study: MBS-Modified Barium Swallow Study  Patient Details Name: Deanna [name] MRN: [id number] Date of Birth: [birth year] Today's Date: 07/19/2019 Time: SLP Start Time (ACUTE ONLY): [id number] -SLP Stop Time (ACUTE ONLY): 1445 SLP Time Calculation (min) (ACUTE ONLY): 30 min Past Medical History: Past Medical  History: Diagnosis Date . AAA (abdominal aortic aneurysm) [facility]) 2011  Dr Early repaired this . Acute ischemic colitis ([place]) 03/06/2012 . Anemia   patient denies  . Arthritis   bilateral hands and knees . Bleeding ulcer 1980s  H. Pylori . Blood transfusion without reported diagnosis  . Cancer ([place])   hx of skin cancer on face  . Cataract   In the past . Chronic urinary tract infection  . Cirrhosis, biliary ([place])   liver bx 01/2008 . Diastolic CHF ([place]) [id number] . Epistaxis  . Fatigue  . GERD (gastroesophageal reflux disease)  . HLD (hyperlipidemia)  . HTN (hypertension)  . Hypertension  . Leg pain   bilateral . OSA (obstructive sleep apnea)   mild on ss of 12/2008 . Pneumonia   hx of years ago  . Short-term memory loss   related to trauma of 08/2015  . Subdural hematoma ([place]) 08/2015 . Thoracic aortic aneurysm [facility])  Past Surgical History: Past Surgical History: Procedure Laterality Date . ABDOMINAL AORTIC ANEURYSM REPAIR  02/01/2010 . BREAST BIOPSY   . CATARACT EXTRACTION   . COLONOSCOPY  03/08/2012  Procedure: COLONOSCOPY;  Surgeon: Gatha Mayer, MD;  Location: [facility];   Service: Endoscopy;  Laterality: N/A; . CRANIOTOMY    for evacuation of subdural hematoma - 08/29/15  . CYSTOSCOPY WITH HOLMIUM LASER LITHOTRIPSY Right 11/05/2015  Procedure: CYSTOSCOPY WITH HOLMIUM LASER LITHOTRIPSY;  Surgeon: Irine Seal, MD;  Location: [facility];  Service: Urology;  Laterality: Right; . CYSTOSCOPY WITH URETEROSCOPY, STONE BASKETRY AND STENT PLACEMENT Right 11/05/2015  Procedure: CYSTOSCOPY WITH URETEROSCOPY, STONE BASKETRY AND STENT EXCHANGE;  Surgeon: Irine Seal, MD;  Location: [facility];  Service: Urology;  Laterality: Right; . ERCP N/A 04/20/2013  Procedure: ENDOSCOPIC RETROGRADE CHOLANGIOPANCREATOGRAPHY (ERCP);  Surgeon: Ladene Artist, MD;  Location: [facility];  Service: Endoscopy;  Laterality: N/A; . ILIAC ARTERY - FEMORAL ARTERY BYPASS GRAFT  2011  Dr Early HPI: 86yo female admitted 07/18/19 with AMS, fall at home. PMH: TBI (2017), SDH/SAH s/p crani, HTN, GERD, HLD  Subjective: Pt seen in radiology for instrumental swallow evaluation Assessment / Plan / Recommendation CHL IP CLINICAL IMPRESSIONS 07/19/2019 Clinical Impression Pt presents with normal oropharyngeal swallow. No oral residue, no penetration or aspiration, and no post-swallow residue. Pt tolerated trials of thin liquid via cup and straw, puree, solid texture, and barium tablet with water without difficulty. Recommend continuing regular diet and thin liquids. Results and recommendations were reviewed with pt and her 2 daughters following this study. No further ST intervention recommended at this time.  Please reconsult if needs arise.  SLP Visit Diagnosis Dysphagia, oropharyngeal phase (R13.12)     Impact on safety and function Mild aspiration risk   CHL IP TREATMENT RECOMMENDATION 07/19/2019 Treatment Recommendations No treatment recommended at this time   Prognosis 07/19/2019 Prognosis for Safe Diet Advancement Good     CHL IP DIET RECOMMENDATION 07/19/2019 SLP Diet Recommendations Regular solids;Thin liquid Liquid Administration via Cup;Straw  Medication Administration Whole meds with liquid Compensations Slow rate;Small sips/bites Postural Changes Seated upright at 90 degrees   CHL IP OTHER RECOMMENDATIONS 07/19/2019   Oral Care Recommendations Oral care BID     CHL IP FOLLOW UP RECOMMENDATIONS 07/19/2019 Follow up Recommendations None      CHL IP ORAL PHASE 07/19/2019 Oral Phase WFL  CHL IP PHARYNGEAL PHASE 07/19/2019 Pharyngeal Phase WFL  CHL IP CERVICAL ESOPHAGEAL PHASE 07/19/2019 Cervical Esophageal Phase WFL Celia B. Quentin Ore, [facility], [place] Pathologist Office: [phone] Pager: [phone] [place],  Fredirick Maudlin 07/19/2019, 3:00 PM                   Scheduled Meds: . amLODipine  5 mg Oral Daily  . azithromycin  250 mg Oral Daily  . diclofenac Sodium  2 g Topical QID  . enoxaparin (LOVENOX) injection  40 mg Subcutaneous Q24H  . fenofibrate  160 mg Oral Daily  . fluticasone  2 spray Each Nare Daily  . gabapentin  200 mg Oral QHS  . polyethylene glycol  17 g Oral Daily  . senna-docusate  1 tablet Oral BID   Continuous Infusions: . cefTRIAXone (ROCEPHIN)  IV Stopped (07/20/19 1829)     LOS: 2 days    Time spent: 65min  Domenic Polite, MD [facility]  07/21/2019, 2:34 PM

## 2019-07-22 ENCOUNTER — Inpatient Hospital Stay (HOSPITAL_COMMUNITY): Payer: Medicare Other

## 2019-07-22 LAB — COMPREHENSIVE METABOLIC PANEL
ALT: 17 U/L (ref 0–44)
AST: 20 U/L (ref 15–41)
Albumin: 2.4 g/dL — ABNORMAL LOW (ref 3.5–5.0)
Alkaline Phosphatase: 53 U/L (ref 38–126)
Anion gap: 8 (ref 5–15)
BUN: 17 mg/dL (ref 8–23)
CO2: 28 mmol/L (ref 22–32)
Calcium: 8.7 mg/dL — ABNORMAL LOW (ref 8.9–10.3)
Chloride: 101 mmol/L (ref 98–111)
Creatinine, Ser: 1.02 mg/dL — ABNORMAL HIGH (ref 0.44–1.00)
GFR calc Af Amer: 57 mL/min — ABNORMAL LOW (ref >60)
GFR calc non Af Amer: 49 mL/min — ABNORMAL LOW (ref >60)
Glucose, Bld: 107 mg/dL — ABNORMAL HIGH (ref 70–99)
Potassium: 4.3 mmol/L (ref 3.5–5.1)
Sodium: 137 mmol/L (ref 135–145)
Total Bilirubin: 1.1 mg/dL (ref 0.3–1.2)
Total Protein: 6.2 g/dL — ABNORMAL LOW (ref 6.5–8.1)

## 2019-07-22 LAB — CBC
HCT: 37.2 % (ref 36.0–46.0)
Hemoglobin: 11.5 g/dL — ABNORMAL LOW (ref 12.0–15.0)
MCH: 28.9 pg (ref 26.0–34.0)
MCHC: 30.9 g/dL (ref 30.0–36.0)
MCV: 93.5 fL (ref 80.0–100.0)
Platelets: 265 10*3/uL (ref 150–400)
RBC: 3.98 MIL/uL (ref 3.87–5.11)
RDW: 15.3 % (ref 11.5–15.5)
WBC: 9.8 10*3/uL (ref 4.0–10.5)
nRBC: 0 % (ref 0.0–0.2)

## 2019-07-22 LAB — URINE CULTURE: Culture: <10000 — AB

## 2019-07-22 MED ORDER — SODIUM CHLORIDE 0.9 % IV SOLN
3.0000 g | Freq: Four times a day (QID) | INTRAVENOUS | Status: DC
  Administered 2019-07-22 – 2019-07-25 (×11): 3 g via INTRAVENOUS
  Filled 2019-07-22: qty 3
  Filled 2019-07-22: qty 8
  Filled 2019-07-22 (×8): qty 3
  Filled 2019-07-22: qty 8
  Filled 2019-07-22: qty 3

## 2019-07-22 NOTE — Progress Notes (Signed)
Pharmacy Antibiotic Note  Deanna [name] is [name] 86 y.o. female admitted on 07/18/2019 with pneumonia; on Augmentin PTA for UTI. CAP being treated with Rocephin/Zithromax but today new concern for aspiration and Pharmacy has been consulted for Unasyn dosing.  Plan:  Unasyn 3g IV q6 hr  Pharmacy to follow peripherally for changes in renal function, cultures, or clinical status  Height: 5\' 9"  (175.3 cm) Weight: 100.7 kg (222 lb 0.1 oz) IBW/kg (Calculated) : 66.2  Temp (24hrs), Avg:97.8 F (36.6 C), Min:97.5 F (36.4 C), Max:98.3 F (36.8 C)  Recent Labs  Lab 07/18/19 1739 07/18/19 1945 07/19/19 0516 07/20/19 0530 07/21/19 0600 07/22/19 0539  WBC  --  13.4* 14.9* 8.7 8.5 9.8  CREATININE 1.12*  --  0.98 1.25* 1.12* 1.02*    Estimated Creatinine Clearance: 49.1 mL/min ([name]) (by C-G formula based on SCr of 1.02 mg/dL (H)).    Allergies  Allergen Reactions  . Keppra [Levetiracetam] Shortness Of Breath  . Crestor [Rosuvastatin] Other (See Comments)    Muscle weakness  . Ezetimibe Other (See Comments)    Muscle weakness     Thank you for allowing pharmacy to be [name] part of this patient's care.  Deanna [name] 07/22/2019 6:10 PM

## 2019-07-22 NOTE — Progress Notes (Signed)
PROGRESS NOTE    Deanna [name]  [id number] DOB: 12-Feb-1930 DOA: 07/18/2019 PCP: Ann Held, DO  Brief Narrative: HPI: Deanna [name] is a 86 y.o. female with medical history significant of TBI prior SDH/SAH requiring craniotomy, HTN. -Presented to ED for evaluation of UTI and fall.  Foul smelling urine, went to PCP 4/15, dx with UTI, culture still pending, augmentin ordered but not yet started.  Went home, had fall at home and the came to the ED ED Course: CT head without acute findings.  CXR ? R sided PNA.  WBC 13k.  Assessment & Plan:   Metabolic encephalopathy -Likely secondary to UTI and/or aspiration pneumonia -Treated with IV ceftriaxone and azithromycin -Urine culture with < 10,000 colonies -chest x-ray with chronic findings -Ammonia level is normal -Improving overall, also suspect component of OSA, hypercarbia could be contributing however we do not think she would have a sleep study or wear CPAP at age 13 -Continue supportive care -PT OT eval completed, SNF recommended for rehab -Social work consulted  UTI -Suspected, urinalysis abnormal, unable to assess symptomatology given cognitive deficits -Day 4 ceftriaxone, urine cultures with less than 10,000 colonies -If chest x-ray is unremarkable will discontinue antibiotics and monitor  Cough, ? Aspiration -Continues to have productive cough, especially after meals -Recent chest x-ray with chronic findings -SLP evaluation completed, mild aspiration risk noted -Will repeat chest x-ray and ask SLP to reevaluate  Right foot pain, numbness -Obtained x-ray given recent fall, this was negative for acute findings -Discussed with daughter about limited utility of extensive work-up given advanced age and dementia -Tolerated low-dose gabapentin which was started last night without oversedation  HTN -Stable, hold ARB  Dementia, prior TBI -With ongoing cognitive and functional decline -Daughter considering long-term  placement -PT OT eval completed, social work consulted for rehab  Aortic aneurysm -Seen by vascular surgery in the past, not felt to be a surgical candidate  History of primary biliary cirrhosis -This was reportedly diagnosed at [facility] 8 or 9 years ago -LFTs unremarkable and bilirubin is normal, normal ammonia level  DVT prophylaxis: lovenox Code Status: DNR Family Communication: Discussed with daughter Levada Dy Disposition Plan: Patient is from home with her daughter anticipate discharge is to SNF,  -Consider discharge to SNF tomorrow if stable from a mental status, respiratory standpoint    Consultants:     Procedures:   Antimicrobials:    Subjective: -Continues to have cough, intermittent shortness of breath -Does not report any pain in her leg today  Objective: Vitals:   07/21/19 1301 07/21/19 2237 07/22/19 0618 07/22/19 1320  BP: 112/71 (!) 155/82 131/77 (!) 105/56  Pulse: 84 93 83 88  Resp: 20 20 19 16   Temp: 97.8 F (36.6 C) 98.3 F (36.8 C) (!) 97.5 F (36.4 C) 97.7 F (36.5 C)  TempSrc: Oral Oral Oral Oral  SpO2: 90% 90% 97% 94%  Weight:      Height:        Intake/Output Summary (Last 24 hours) at 07/22/2019 1339 Last data filed at 07/22/2019 [id number] Gross per 24 hour  Intake 160 ml  Output 1350 ml  Net -1190 ml   Filed Weights   07/18/19 1654  Weight: 100.7 kg    Examination:  Gen: Pleasant obese elderly female, sitting up in bed, eating breakfast, oriented to self and partly to  time, cognitive deficits noted HEENT: Scalp with multiple surgical scars and deformity  lungs: Rhonchi at both bases CVS: S1-S2, regular rate rhythm Abd: soft, Non tender,  non distended, BS present Extremities: Trace edema, chronic venous stasis changes Skin: no new rashes psychiatry: Pleasant, appropriate mood    Data Reviewed:   CBC: Recent Labs  Lab 07/18/19 1945 07/19/19 0516 07/20/19 0530 07/21/19 0600 07/22/19 0539  WBC 13.4* 14.9* 8.7 8.5 9.8   NEUTROABS 11.6*  --   --   --   --   HGB 11.2* 12.6 11.3* 11.9* 11.5*  HCT 36.0 41.2 37.5 39.7 37.2  MCV 92.5 94.3 95.7 94.7 93.5  PLT 266 313 272 299 [id number]   Basic Metabolic Panel: Recent Labs  Lab 07/18/19 1739 07/19/19 0516 07/20/19 0530 07/21/19 0600 07/22/19 0539  NA 139 139 137 138 137  K 4.7 4.3 4.1 4.1 4.3  CL 104 108 106 102 101  CO2 24 18* 25 26 28   GLUCOSE 120* 103* 87 89 107*  BUN 19 18 25* 22 17  CREATININE 1.12* 0.98 1.25* 1.12* 1.02*  CALCIUM 9.1 8.9 8.6* 8.8* 8.7*   GFR: Estimated Creatinine Clearance: 49.1 mL/min (A) (by C-G formula based on SCr of 1.02 mg/dL (H)). Liver Function Tests: Recent Labs  Lab 07/22/19 0539  AST 20  ALT 17  ALKPHOS 53  BILITOT 1.1  PROT 6.2*  ALBUMIN 2.4*   No results for input(s): LIPASE, AMYLASE in the last 168 hours. Recent Labs  Lab 07/21/19 1554  AMMONIA 35   Coagulation Profile: No results for input(s): INR, PROTIME in the last 168 hours. Cardiac Enzymes: No results for input(s): CKTOTAL, CKMB, CKMBINDEX, TROPONINI in the last 168 hours. BNP (last 3 results) No results for input(s): PROBNP in the last 8760 hours. HbA1C: No results for input(s): HGBA1C in the last 72 hours. CBG: No results for input(s): GLUCAP in the last 168 hours. Lipid Profile: No results for input(s): CHOL, HDL, LDLCALC, TRIG, CHOLHDL, LDLDIRECT in the last 72 hours. Thyroid Function Tests: No results for input(s): TSH, T4TOTAL, FREET4, T3FREE, THYROIDAB in the last 72 hours. Anemia Panel: No results for input(s): VITAMINB12, FOLATE, FERRITIN, TIBC, IRON, RETICCTPCT in the last 72 hours. Urine analysis:    Component Value Date/Time   COLORURINE ORANGE (A) 04/20/2013 1515   APPEARANCEUR CLOUDY (A) 04/20/2013 1515   LABSPEC >1.046 (H) 04/20/2013 1515   PHURINE 5.5 04/20/2013 1515   GLUCOSEU NEGATIVE 04/20/2013 1515   HGBUR TRACE (A) 04/20/2013 1515   HGBUR negative 06/15/2010 0943   BILIRUBINUR negative 07/18/2019 1448   KETONESUR  NEGATIVE 04/20/2013 1515   PROTEINUR Positive (A) 07/18/2019 1448   PROTEINUR NEGATIVE 04/20/2013 1515   UROBILINOGEN 0.2 07/18/2019 1448   UROBILINOGEN 2.0 (H) 04/20/2013 1515   NITRITE postive 07/18/2019 1448   NITRITE POSITIVE (A) 04/20/2013 1515   LEUKOCYTESUR Large (3+) (A) 07/18/2019 1448   Sepsis Labs: @LABRCNTIP (procalcitonin:4,lacticidven:4)  ) Recent Results (from the past 240 hour(s))  Urine Culture     Status: None   Collection Time: 07/18/19  3:01 PM   Specimen: Urine  Result Value Ref Range Status   MICRO NUMBER: [id number]  Final   SPECIMEN QUALITY: Adequate  Final   Sample Source NOT GIVEN  Final   STATUS: FINAL  Final   Result:   Final    Growth of mixed flora was isolated, suggesting probable contamination. No further testing will be performed. If clinically indicated, recollection using a method to minimize contamination, with prompt transfer to Urine Culture Transport Tube, is  recommended.   SARS CORONAVIRUS 2 (TAT 6-24 HRS) Nasopharyngeal Nasopharyngeal Swab     Status: None   Collection  Time: 07/18/19  8:35 PM   Specimen: Nasopharyngeal Swab  Result Value Ref Range Status   SARS Coronavirus 2 NEGATIVE NEGATIVE Final    Comment: (NOTE) SARS-CoV-2 target nucleic acids are NOT DETECTED. The SARS-CoV-2 RNA is generally detectable in upper and lower respiratory specimens during the acute phase of infection. Negative results do not preclude SARS-CoV-2 infection, do not rule out co-infections with other pathogens, and should not be used as the sole basis for treatment or other patient management decisions. Negative results must be combined with clinical observations, patient history, and epidemiological information. The expected result is Negative. Fact Sheet for Patients: SugarRoll.be Fact Sheet for Healthcare Providers: https://www.woods-mathews.com/ This test is not yet approved or cleared by the [place] FDA  and  has been authorized for detection and/or diagnosis of SARS-CoV-2 by FDA under an Emergency Use Authorization (EUA). This EUA will remain  in effect (meaning this test can be used) for the duration of the COVID-19 declaration under Section 56 4(b)(1) of the Act, 21 U.S.C. section 360bbb-3(b)(1), unless the authorization is terminated or revoked sooner. Performed at [facility], [place] [address], [place], [place] [address]   Culture, blood (routine x 2)     Status: None (Preliminary result)   Collection Time: 07/18/19 10:28 PM   Specimen: BLOOD  Result Value Ref Range Status   Specimen Description   Final    BLOOD BLOOD RIGHT WRIST Performed at [place] [address], [place], [place] [address]    Special Requests   Final    BOTTLES DRAWN AEROBIC AND ANAEROBIC Blood Culture adequate volume Performed at [place] [address], [place], [place] [address]    Culture   Final    NO GROWTH 3 DAYS Performed at [facility], [place] [address] [place], [place] [address]    Report Status PENDING  Incomplete  Culture, blood (routine x 2)     Status: None (Preliminary result)   Collection Time: 07/18/19 10:28 PM   Specimen: BLOOD  Result Value Ref Range Status   Specimen Description   Final    BLOOD BLOOD LEFT HAND Performed at [place] [address], [place], [place] [address]    Special Requests   Final    BOTTLES DRAWN AEROBIC ONLY Blood Culture adequate volume Performed at [place] [address] [place], [place] [address]    Culture   Final    NO GROWTH 3 DAYS Performed at [facility], [place] [address], [place], [place] [address]    Report Status PENDING  Incomplete  Culture, Urine     Status: Abnormal   Collection Time: 07/19/19  6:30 PM   Specimen: Urine, Clean Catch  Result Value Ref Range Status   Specimen Description   Final    URINE, CLEAN CATCH  Performed at [facility], [place] [address], [place], [place] [address]    Special Requests   Final    NONE Performed at [facility], [place] [address] [place], [place] [address]    Culture (A)  Final    <10,000 COLONIES/mL INSIGNIFICANT GROWTH Performed at [place] [address] [place], [place] [address]    Report Status 07/22/2019 FINAL  Final         Radiology Studies: DG Ankle 2 Views Right  Result Date: 07/21/2019 CLINICAL DATA:  Fall 3 days ago, pain to lateral and medial malleolus  of RIGHT ankle. History of arthritis. EXAM: RIGHT ANKLE - 2 VIEW COMPARISON:  None. FINDINGS: Osseous alignment is normal. Ankle mortise is symmetric. No fracture line or evidence of acute avulsion fracture. Well corticated calcification underlying the medial malleolus, consistent with old injury. Visualized portions of the hindfoot and midfoot appear intact and normally aligned. Mild degenerative spurring within the midfoot. Soft tissues about the RIGHT ankle are unremarkable. IMPRESSION: 1. No acute findings. No osseous fracture or dislocation. 2. Mild degenerative spurring within the midfoot. Electronically Signed   By: Franki Cabot M.D.   On: 07/21/2019 12:45        Scheduled Meds: . amLODipine  5 mg Oral Daily  . diclofenac Sodium  2 g Topical QID  . enoxaparin (LOVENOX) injection  40 mg Subcutaneous Q24H  . fenofibrate  160 mg Oral Daily  . fluticasone  2 spray Each Nare Daily  . gabapentin  200 mg Oral QHS  . polyethylene glycol  17 g Oral Daily  . senna-docusate  1 tablet Oral BID   Continuous Infusions: . cefTRIAXone (ROCEPHIN)  IV Stopped (07/21/19 1854)     LOS: 3 days    Time spent: 60min  Domenic Polite, MD [facility]  07/22/2019, 1:39 PM

## 2019-07-22 NOTE — TOC Progression Note (Addendum)
Transition of Care [facility]) - Progression Note    Patient Details  Name: Arriana Zamudio MRN: [id number] Date of Birth: December 18, 1929  Transition of [facility]) CM/SW Contact  Paisli Silfies, Marjie Skiff, RN Phone Number: 07/22/2019, 1:20 PM  Clinical Narrative:     Snf bed offers given to daughter at the pt bedside. Pt will need another Covid test prior to dc. TOC will follow up with family for bed choice.  Expected Discharge Plan: [place] Barriers to Discharge: Continued Medical Work up  Expected Discharge Plan and Services Expected Discharge Plan: [place]   Discharge Planning Services: CM Consult Post Acute Care Choice: [place] Living arrangements for the past 2 months: Single Family Home                   Social Determinants of Health (SDOH) Interventions    Readmission Risk Interventions No flowsheet data found.

## 2019-07-22 NOTE — Care Management Important Message (Signed)
Important Message  Patient Details IM Letter given to Marney Doctor RN Case Manager to present to the Patient Name: Suman Mccourt MRN: [id number] Date of Birth: 06-23-1929   Medicare Important Message Given:  Yes     Kerin Salen 07/22/2019, 10:03 AM

## 2019-07-23 ENCOUNTER — Inpatient Hospital Stay (HOSPITAL_COMMUNITY): Payer: Medicare Other

## 2019-07-23 LAB — CBC
HCT: 37.7 % (ref 36.0–46.0)
Hemoglobin: 11.4 g/dL — ABNORMAL LOW (ref 12.0–15.0)
MCH: 28.5 pg (ref 26.0–34.0)
MCHC: 30.2 g/dL (ref 30.0–36.0)
MCV: 94.3 fL (ref 80.0–100.0)
Platelets: 272 10*3/uL (ref 150–400)
RBC: 4 MIL/uL (ref 3.87–5.11)
RDW: 15.1 % (ref 11.5–15.5)
WBC: 8.7 10*3/uL (ref 4.0–10.5)
nRBC: 0 % (ref 0.0–0.2)

## 2019-07-23 LAB — BASIC METABOLIC PANEL
Anion gap: 10 (ref 5–15)
BUN: 19 mg/dL (ref 8–23)
CO2: 25 mmol/L (ref 22–32)
Calcium: 8.9 mg/dL (ref 8.9–10.3)
Chloride: 102 mmol/L (ref 98–111)
Creatinine, Ser: 1 mg/dL (ref 0.44–1.00)
GFR calc Af Amer: 59 mL/min — ABNORMAL LOW (ref >60)
GFR calc non Af Amer: 51 mL/min — ABNORMAL LOW (ref >60)
Glucose, Bld: 101 mg/dL — ABNORMAL HIGH (ref 70–99)
Potassium: 4.2 mmol/L (ref 3.5–5.1)
Sodium: 137 mmol/L (ref 135–145)

## 2019-07-23 LAB — SARS CORONAVIRUS 2 (TAT 6-24 HRS): SARS Coronavirus 2: NEGATIVE

## 2019-07-23 MED ORDER — IOHEXOL 350 MG/ML SOLN
100.0000 mL | Freq: Once | INTRAVENOUS | Status: AC | PRN
  Administered 2019-07-23: 14:00:00 100 mL via INTRAVENOUS

## 2019-07-23 MED ORDER — SODIUM CHLORIDE (PF) 0.9 % IJ SOLN
INTRAMUSCULAR | Status: AC
  Filled 2019-07-23: qty 50

## 2019-07-23 MED ORDER — ALBUTEROL SULFATE (2.5 MG/3ML) 0.083% IN NEBU
2.5000 mg | INHALATION_SOLUTION | RESPIRATORY_TRACT | Status: DC | PRN
  Administered 2019-07-23: 16:00:00 2.5 mg via RESPIRATORY_TRACT

## 2019-07-23 NOTE — TOC Progression Note (Signed)
Transition of Care [facility]) - Progression Note    Patient Details  Name: Devynne Rambert MRN: [id number] Date of Birth: 1929-05-27  Transition [facility] Care [facility]) CM/SW Contact  Dedria Endres, Marjie Skiff, RN Phone Number: 07/23/2019, 12:13 PM  Clinical Narrative:     This CM spoke with daughters at the bedside to follow up on SNF bed choice. They voiced their concern over star ratings of the SNF bed offers. This CM gave them an additional bed offer that came in since yesterday. This CM also informed daughters of facilities that have not responded in the hub. Daughter Levada Dy to call those facilities to see if any bed availability at one of the facilities with a better star rating. This CM gave Levada Dy my phone number to call me with choice. Per MD, pt is not medically ready for dc today. TOC will continue to follow.  Expected Discharge Plan: [place] Barriers to Discharge: Continued Medical Work up  Expected Discharge Plan and Services Expected Discharge Plan: [place]   Discharge Planning Services: CM Consult Post Acute Care Choice: [place] Living arrangements for the past 2 months: Single Family Home                        Social Determinants of Health (SDOH) Interventions    Readmission Risk Interventions No flowsheet data found.

## 2019-07-23 NOTE — Progress Notes (Signed)
RT asked to assess patient. Patient will wake up, but had some agitation when she was awoken serveral times during assessment. Patient BBS were diminished B/L, O2 92% on 3 L, and patient only taking small breaths. Per CT, patient has trace pleural effusions and RLL atelectasis. PRN albuterol given, cough mechanics assessed (patient needs prompting, but can clear), and IS performed as well; IS was 500 mL (patient again needs prompting). Patient will need encouraging to do IS and cough. Patient's daughters at bedside at this time and understand and willing to encourage. RT recommendations: encourage IS, up in chair to help take deeper breaths w/ IS, encourage strong coughing/clearance, and PRN breathing treatments. Wean O2 as tolerated. RT available and will continue to assess.

## 2019-07-23 NOTE — Progress Notes (Signed)
SLP Cancellation Note  Patient Details Name: Deanna [name] MRN: [id number] DOB: [birth year]   Cancelled treatment:       Reason Eval/Treat Not Completed: Other (comment)(SLP spoke to [facility] who reports pt was coughing with liquids and with medications this am. Congested cough present at this time per RN.  Pt getting ready to go to CT chest per RN.  SLP will follow up after CT.)   SLP requested RN hold lunch meal tray (if pt has not consumed) to allow functional meal observation by SLP.  Requested RN secure chat SLP when pt is ready for po.  Also note pt has h/o GERD, ? If this could contribute to her symptoms.     [name], MS [facility] SLP Acute Rehab Services Office [phone]    [name] 07/23/2019, 12:39 PM

## 2019-07-23 NOTE — Progress Notes (Addendum)
PROGRESS NOTE    Deanna [name]  [id number] DOB: 08-Feb-1930 DOA: 07/18/2019 PCP: Ann Held, DO  Brief Narrative: HPI: Deanna [name] is a 86 y.o. female with medical history significant of TBI prior SDH/SAH requiring craniotomy, dementia, HTN. -Presented to ED for evaluation of UTI and fall.  Foul smelling urine, went to PCP 4/15, dx with UTI, culture still pending, augmentin ordered but not yet started.  Went home, had fall at home and the came to the ED. -She has had ongoing failure to thrive, cognitive and functional decline for months ED Course: CT head without acute findings.  CXR ? R sided PNA.  WBC 13k. [facility] course notable for productive cough and chronic clinical concern for aspiration however repeat x-rays noted chronic findings only -Follow-up x-ray 4/19 with worsening atelectasis vs infiltrate at right base started Unasyn 4/19 for aspiration pneumonia.  X-ray also concerning for possible enlargement of aneurysm, recommended CTA   Assessment & Plan:   Metabolic encephalopathy  -Likely secondary to aspiration pneumonia, on admission UTI was suspected as well -Initially treated with IV ceftriaxone and azithromycin, subsequently urine cultures grew <10,000 colonies, chest x-ray noted chronic findings only initially  -Ammonia level was normal  -Repeat x-rays 4/19 with concern for right-sided infiltrate , started IV Unasyn 4/19  -Also suspect a component of OSA, possibly hypercarbia which could be contributing as well however we do not think she would participate in a sleep study or wear CPAP at her age -Mental status has improved -Continue IV Unasyn today, transition to oral Augmentin tomorrow if stable -PT OT eval completed, SNF recommended for rehab -Social work following  Thoracic aortic aneurysm -Yesterday evening's x-ray noted some questionable enlargement in aortic arch compared to prior x-rays -Previously followed by Dr. Donnetta Hutching with vascular surgery, she was  not felt to be a surgical candidate 3 years ago, discussed this with patient's daughter today in detail, however from a prognostic standpoint we will proceed with CTA today, unlikely to change management. Addendum: CTA today notes slight increase in size of largest diameter of AAA from 4.7cm in 2018 to 5.1-5.4cm range now, d/w daughter, no plans to consider surgery at this time, unable to predict when this could be catastrophic  Possible UTI -Abnormal urinalysis, unable to assess symptomatology given cognitive deficits -Treated with IV ceftriaxone initially, urine cultures with less than 10,000 colonies -Stopped antibiotics for this  Aspiration pneumonia -See discussion above, -Continues to have productive cough, especially after meals -SLP evaluation completed, mild aspiration risk noted, will ask SLP to reevaluate given ongoing productive cough especially after meals  Right foot pain, numbness -Obtained x-ray given recent fall, this was negative for acute findings -Discussed with daughter about limited utility of extensive work-up given advanced age and dementia -Tolerated low-dose gabapentin which was started 2 nights ago, tolerating this without oversedation  HTN -Stable, hold ARB  Dementia, prior TBI -With ongoing cognitive and functional decline -Daughter considering long-term placement -PT OT eval completed, social work consulted for rehab  History of primary biliary cirrhosis -This was reportedly diagnosed at [facility] 8 or 9 years ago -LFTs unremarkable and bilirubin is normal, normal ammonia level  DVT prophylaxis: lovenox Code Status: DNR Family Communication: Discussed with daughter Levada Dy Disposition Plan: Patient is from home with her daughter anticipate discharge is to SNF,  -Consider discharge to SNF tomorrow if stable from a mental status, respiratory standpoint    Consultants:     Procedures:   Antimicrobials:    Subjective: -Continues to have intermittent  cough, shortness of breath -No issues overnight  Objective: Vitals:   07/22/19 1320 07/22/19 2123 07/23/19 0548 07/23/19 1000  BP: (!) 105/56 (!) 101/56 136/89 97/76  Pulse: 88 86 84 70  Resp: 16 19 18 16   Temp: 97.7 F (36.5 C) 98.9 F (37.2 C) 97.8 F (36.6 C) (!) 97.4 F (36.3 C)  TempSrc: Oral Oral Oral Oral  SpO2: 94% 94% 96% 94%  Weight:      Height:        Intake/Output Summary (Last 24 hours) at 07/23/2019 1217 Last data filed at 07/23/2019 0530 Gross per 24 hour  Intake 345 ml  Output 750 ml  Net -405 ml   Filed Weights   07/18/19 1654  Weight: 100.7 kg    Examination:  Gen: Pleasant obese elderly female, sitting up in bed, eating breakfast, oriented to self, cognitive, memory deficits noted HEENT: Obese, unable to assess JVD Lungs: Bibasilar rhonchi CVS: S1-S2, regular rhythm Abd: soft, Non tender, non distended, BS present Extremities: Trace edema, bandage in her left lower leg Skin: no new rashes on exposed skin, as above psychiatry: Pleasant, appropriate mood    Data Reviewed:   CBC: Recent Labs  Lab 07/18/19 1945 07/18/19 1945 07/19/19 0516 07/20/19 0530 07/21/19 0600 07/22/19 0539 07/23/19 0601  WBC 13.4*   < > 14.9* 8.7 8.5 9.8 8.7  NEUTROABS 11.6*  --   --   --   --   --   --   HGB 11.2*   < > 12.6 11.3* 11.9* 11.5* 11.4*  HCT 36.0   < > 41.2 37.5 39.7 37.2 37.7  MCV 92.5   < > 94.3 95.7 94.7 93.5 94.3  PLT 266   < > 313 272 299 265 272   < > = values in this interval not displayed.   Basic Metabolic Panel: Recent Labs  Lab 07/19/19 0516 07/20/19 0530 07/21/19 0600 07/22/19 0539 07/23/19 0601  NA 139 137 138 137 137  K 4.3 4.1 4.1 4.3 4.2  CL 108 106 102 101 102  CO2 18* 25 26 28 25   GLUCOSE 103* 87 89 107* 101*  BUN 18 25* 22 17 19   CREATININE 0.98 1.25* 1.12* 1.02* 1.00  CALCIUM 8.9 8.6* 8.8* 8.7* 8.9   GFR: Estimated Creatinine Clearance: 50.1 mL/min (by C-G formula based on SCr of 1 mg/dL). Liver Function  Tests: Recent Labs  Lab 07/22/19 0539  AST 20  ALT 17  ALKPHOS 53  BILITOT 1.1  PROT 6.2*  ALBUMIN 2.4*   No results for input(s): LIPASE, AMYLASE in the last 168 hours. Recent Labs  Lab 07/21/19 1554  AMMONIA 35   Coagulation Profile: No results for input(s): INR, PROTIME in the last 168 hours. Cardiac Enzymes: No results for input(s): CKTOTAL, CKMB, CKMBINDEX, TROPONINI in the last 168 hours. BNP (last 3 results) No results for input(s): PROBNP in the last 8760 hours. HbA1C: No results for input(s): HGBA1C in the last 72 hours. CBG: No results for input(s): GLUCAP in the last 168 hours. Lipid Profile: No results for input(s): CHOL, HDL, LDLCALC, TRIG, CHOLHDL, LDLDIRECT in the last 72 hours. Thyroid Function Tests: No results for input(s): TSH, T4TOTAL, FREET4, T3FREE, THYROIDAB in the last 72 hours. Anemia Panel: No results for input(s): VITAMINB12, FOLATE, FERRITIN, TIBC, IRON, RETICCTPCT in the last 72 hours. Urine analysis:    Component Value Date/Time   COLORURINE ORANGE (A) 04/20/2013 1515   APPEARANCEUR CLOUDY (A) 04/20/2013 1515   LABSPEC >1.046 (H) 04/20/2013 1515  PHURINE 5.5 04/20/2013 1515   GLUCOSEU NEGATIVE 04/20/2013 1515   HGBUR TRACE (A) 04/20/2013 1515   HGBUR negative 06/15/2010 0943   BILIRUBINUR negative 07/18/2019 1448   KETONESUR NEGATIVE 04/20/2013 1515   PROTEINUR Positive (A) 07/18/2019 1448   PROTEINUR NEGATIVE 04/20/2013 1515   UROBILINOGEN 0.2 07/18/2019 1448   UROBILINOGEN 2.0 (H) 04/20/2013 1515   NITRITE postive 07/18/2019 1448   NITRITE POSITIVE (A) 04/20/2013 1515   LEUKOCYTESUR Large (3+) (A) 07/18/2019 1448   Sepsis Labs: @LABRCNTIP (procalcitonin:4,lacticidven:4)  ) Recent Results (from the past 240 hour(s))  Urine Culture     Status: None   Collection Time: 07/18/19  3:01 PM   Specimen: Urine  Result Value Ref Range Status   MICRO NUMBER: [id number]  Final   SPECIMEN QUALITY: Adequate  Final   Sample Source NOT  GIVEN  Final   STATUS: FINAL  Final   Result:   Final    Growth of mixed flora was isolated, suggesting probable contamination. No further testing will be performed. If clinically indicated, recollection using a method to minimize contamination, with prompt transfer to Urine Culture Transport Tube, is  recommended.   SARS CORONAVIRUS 2 (TAT 6-24 HRS) Nasopharyngeal Nasopharyngeal Swab     Status: None   Collection Time: 07/18/19  8:35 PM   Specimen: Nasopharyngeal Swab  Result Value Ref Range Status   SARS Coronavirus 2 NEGATIVE NEGATIVE Final    Comment: (NOTE) SARS-CoV-2 target nucleic acids are NOT DETECTED. The SARS-CoV-2 RNA is generally detectable in upper and lower respiratory specimens during the acute phase of infection. Negative results do not preclude SARS-CoV-2 infection, do not rule out co-infections with other pathogens, and should not be used as the sole basis for treatment or other patient management decisions. Negative results must be combined with clinical observations, patient history, and epidemiological information. The expected result is Negative. Fact Sheet for Patients: SugarRoll.be Fact Sheet for Healthcare Providers: https://www.woods-mathews.com/ This test is not yet approved or cleared by the [place] FDA and  has been authorized for detection and/or diagnosis of SARS-CoV-2 by FDA under an Emergency Use Authorization (EUA). This EUA will remain  in effect (meaning this test can be used) for the duration of the COVID-19 declaration under Section 56 4(b)(1) of the Act, 21 U.S.C. section 360bbb-3(b)(1), unless the authorization is terminated or revoked sooner. Performed at [facility], [place] [address], [place], [place] [address]   Culture, blood (routine x 2)     Status: None (Preliminary result)   Collection Time: 07/18/19 10:28 PM   Specimen: BLOOD  Result Value Ref Range Status   Specimen Description    Final    BLOOD BLOOD RIGHT WRIST Performed at [facility] [address], [place], [place] [address]    Special Requests   Final    BOTTLES DRAWN AEROBIC AND ANAEROBIC Blood Culture adequate volume Performed at [facility] [address] [place], [place] [address]    Culture   Final    NO GROWTH 4 DAYS Performed at [facility], [place] [address], [place], [place] [address]    Report Status PENDING  Incomplete  Culture, blood (routine x 2)     Status: None (Preliminary result)   Collection Time: 07/18/19 10:28 PM   Specimen: BLOOD  Result Value Ref Range Status   Specimen Description   Final    BLOOD BLOOD LEFT HAND Performed at [facility] [address] [place], [place]  [address]    Special Requests   Final    BOTTLES DRAWN AEROBIC ONLY Blood Culture adequate volume Performed at [facility] [address], [place], [place] [address]    Culture   Final    NO GROWTH 4 DAYS Performed at [facility], [place] [address] [place], [place] [address]    Report Status PENDING  Incomplete  Culture, Urine     Status: Abnormal   Collection Time: 07/19/19  6:30 PM   Specimen: Urine, Clean Catch  Result Value Ref Range Status   Specimen Description   Final    URINE, CLEAN CATCH Performed at [facility], [place] [address], [place], [place] [address]    Special Requests   Final    NONE Performed at [facility], [place] [address], [place], [place] [address]    Culture (A)  Final    <10,000 COLONIES/mL INSIGNIFICANT GROWTH Performed at [facility] [address], [place], [place] [address]    Report Status 07/22/2019 FINAL  Final  SARS CORONAVIRUS 2 (TAT 6-24 HRS) Nasopharyngeal Nasopharyngeal Swab     Status: None   Collection Time: 07/22/19  5:47 PM   Specimen: Nasopharyngeal Swab  Result Value Ref Range Status   SARS Coronavirus 2 NEGATIVE  NEGATIVE Final    Comment: (NOTE) SARS-CoV-2 target nucleic acids are NOT DETECTED. The SARS-CoV-2 RNA is generally detectable in upper and lower respiratory specimens during the acute phase of infection. Negative results do not preclude SARS-CoV-2 infection, do not rule out co-infections with other pathogens, and should not be used as the sole basis for treatment or other patient management decisions. Negative results must be combined with clinical observations, patient history, and epidemiological information. The expected result is Negative. Fact Sheet for Patients: SugarRoll.be Fact Sheet for Healthcare Providers: https://www.woods-mathews.com/ This test is not yet approved or cleared by the [place] FDA and  has been authorized for detection and/or diagnosis of SARS-CoV-2 by FDA under an Emergency Use Authorization (EUA). This EUA will remain  in effect (meaning this test can be used) for the duration of the COVID-19 declaration under Section 56 4(b)(1) of the Act, 21 U.S.C. section 360bbb-3(b)(1), unless the authorization is terminated or revoked sooner. Performed at [facility], [place] [address] [place], [place] [address]          Radiology Studies: DG Chest 2 View  Result Date: 07/22/2019 CLINICAL DATA:  Productive cough EXAM: CHEST - 2 VIEW COMPARISON:  07/19/2019, 06/12/2019, CT chest 06/09/2016 FINDINGS: Suspected trace pleural effusions. Increasing airspace disease at the right base. Linear atelectasis or scar at the left base. Mild cardiomegaly. Tortuous aorta with aneurysmal dilatation of the arch and descending thoracic aorta, this appears more prominent on the AP view compared to the radiograph several days ago. Aortic atherosclerosis. No pneumothorax. IMPRESSION: 1. Probable trace pleural effusions with increasing atelectasis or infiltrate at the right base. Stable linear atelectasis or scar at the left base 2.  Cardiomegaly. Aneurysmal dilatation of the aortic arch and proximal descending thoracic aorta which appears enlarged as compared with previous radiographs; CT angiography of the chest is recommended for further evaluation. These results will be called to the ordering clinician or representative by the Radiologist Assistant, and communication documented in the PACS or [employer]. Electronically Signed   By: Donavan Foil M.D.   On: 07/22/2019 16:43        Scheduled Meds: . amLODipine  5 mg Oral  Daily  . diclofenac Sodium  2 g Topical QID  . enoxaparin (LOVENOX) injection  40 mg Subcutaneous Q24H  . fenofibrate  160 mg Oral Daily  . fluticasone  2 spray Each Nare Daily  . gabapentin  200 mg Oral QHS  . polyethylene glycol  17 g Oral Daily  . senna-docusate  1 tablet Oral BID   Continuous Infusions: . ampicillin-sulbactam (UNASYN) IV 3 g (07/23/19 0607)     LOS: 4 days    Time spent: 71min  Domenic Polite, MD [facility]  07/23/2019, 12:17 PM

## 2019-07-23 NOTE — Progress Notes (Signed)
PT Cancellation Note  Patient Details Name: Yeimy Niziol MRN: [id number] DOB: 26-Apr-1929   Cancelled Treatment:     pt about to go down stairs for CT Angio chest Aorta.  Will check back back another day.  Pt has been evaluated with rec for SNF.    Rica Koyanagi  PTA Acute  Rehabilitation Services Pager      [phone] Office      [phone]

## 2019-07-23 NOTE — Evaluation (Signed)
Clinical/Bedside Swallow Evaluation Patient Details  Name: Marni Klaas MRN: [id number] Date of Birth: [birth year]  Today's Date: 07/23/2019 Time: SLP Start Time (ACUTE ONLY): [id number] SLP Stop Time (ACUTE ONLY): 1601 SLP Time Calculation (min) (ACUTE ONLY): 30 min  Past Medical History:  Past Medical History:  Diagnosis Date  . AAA (abdominal aortic aneurysm) [facility]) 2011   Dr Early repaired this  . Acute ischemic colitis ([place]) 03/06/2012  . Anemia    patient denies   . Arthritis    bilateral hands and knees  . Bleeding ulcer 1980s   H. Pylori  . Blood transfusion without reported diagnosis   . Cancer ([place])    hx of skin cancer on face   . Cataract    In the past  . Chronic urinary tract infection   . Cirrhosis, biliary ([place])    liver bx 01/2008  . Diastolic CHF ([place]) [id number]  . Epistaxis   . Fatigue   . GERD (gastroesophageal reflux disease)   . HLD (hyperlipidemia)   . HTN (hypertension)   . Hypertension   . Leg pain    bilateral  . OSA (obstructive sleep apnea)    mild on ss of 12/2008  . Pneumonia    hx of years ago   . Short-term memory loss    related to trauma of 08/2015   . Subdural hematoma ([place]) 08/2015  . Thoracic aortic aneurysm [facility])    Past Surgical History:  Past Surgical History:  Procedure Laterality Date  . ABDOMINAL AORTIC ANEURYSM REPAIR  02/01/2010  . BREAST BIOPSY    . CATARACT EXTRACTION    . COLONOSCOPY  03/08/2012   Procedure: COLONOSCOPY;  Surgeon: Gatha Mayer, MD;  Location: [facility];  Service: Endoscopy;  Laterality: N/A;  . CRANIOTOMY     for evacuation of subdural hematoma - 08/29/15   . CYSTOSCOPY WITH HOLMIUM LASER LITHOTRIPSY Right 11/05/2015   Procedure: CYSTOSCOPY WITH HOLMIUM LASER LITHOTRIPSY;  Surgeon: Irine Seal, MD;  Location: [facility];  Service: Urology;  Laterality: Right;  . CYSTOSCOPY WITH URETEROSCOPY, STONE BASKETRY AND STENT PLACEMENT Right 11/05/2015   Procedure: CYSTOSCOPY WITH URETEROSCOPY, STONE BASKETRY AND STENT  EXCHANGE;  Surgeon: Irine Seal, MD;  Location: [facility];  Service: Urology;  Laterality: Right;  . ERCP N/A 04/20/2013   Procedure: ENDOSCOPIC RETROGRADE CHOLANGIOPANCREATOGRAPHY (ERCP);  Surgeon: Ladene Artist, MD;  Location: [facility];  Service: Endoscopy;  Laterality: N/A;  . ILIAC ARTERY - FEMORAL ARTERY BYPASS GRAFT  2011   Dr Early   HPI:      Assessment / Plan / Recommendation Clinical Impression         Aspiration Risk       Diet Recommendation          Other  Recommendations     Follow up Recommendations        Frequency and Duration            Prognosis        Swallow Study   General      Oral/Motor/Sensory Function Overall Oral Motor/Sensory Function: Within functional limits   Ice Chips Ice chips: Not tested   Thin Liquid Thin Liquid: Within functional limits Presentation: Straw Other Comments: ongoing inconsistent cough response    Nectar Thick Nectar Thick Liquid: Not tested   Honey Thick Honey Thick Liquid: Not tested   Puree Puree: Not tested   Solid     Solid: Within functional limits Other Comments: delayed inconsistent cough continues does not  appear coorelated to po intake      [name] 07/23/2019,8:55 PM  [name], MS [place] Office [phone]

## 2019-07-24 LAB — COMPREHENSIVE METABOLIC PANEL
ALT: 14 U/L (ref 0–44)
AST: 17 U/L (ref 15–41)
Albumin: 2.5 g/dL — ABNORMAL LOW (ref 3.5–5.0)
Alkaline Phosphatase: 56 U/L (ref 38–126)
Anion gap: 10 (ref 5–15)
BUN: 18 mg/dL (ref 8–23)
CO2: 28 mmol/L (ref 22–32)
Calcium: 8.9 mg/dL (ref 8.9–10.3)
Chloride: 100 mmol/L (ref 98–111)
Creatinine, Ser: 1.02 mg/dL — ABNORMAL HIGH (ref 0.44–1.00)
GFR calc Af Amer: 57 mL/min — ABNORMAL LOW (ref >60)
GFR calc non Af Amer: 49 mL/min — ABNORMAL LOW (ref >60)
Glucose, Bld: 110 mg/dL — ABNORMAL HIGH (ref 70–99)
Potassium: 4.5 mmol/L (ref 3.5–5.1)
Sodium: 138 mmol/L (ref 135–145)
Total Bilirubin: 0.9 mg/dL (ref 0.3–1.2)
Total Protein: 6.4 g/dL — ABNORMAL LOW (ref 6.5–8.1)

## 2019-07-24 LAB — CBC
HCT: 37.2 % (ref 36.0–46.0)
Hemoglobin: 11.4 g/dL — ABNORMAL LOW (ref 12.0–15.0)
MCH: 28.9 pg (ref 26.0–34.0)
MCHC: 30.6 g/dL (ref 30.0–36.0)
MCV: 94.2 fL (ref 80.0–100.0)
Platelets: 269 10*3/uL (ref 150–400)
RBC: 3.95 MIL/uL (ref 3.87–5.11)
RDW: 15.3 % (ref 11.5–15.5)
WBC: 10.4 10*3/uL (ref 4.0–10.5)
nRBC: 0 % (ref 0.0–0.2)

## 2019-07-24 LAB — CULTURE, BLOOD (ROUTINE X 2)
Culture: NO GROWTH
Culture: NO GROWTH
Special Requests: ADEQUATE
Special Requests: ADEQUATE

## 2019-07-24 LAB — BRAIN NATRIURETIC PEPTIDE: B Natriuretic Peptide: 171.1 pg/mL — ABNORMAL HIGH (ref 0.0–100.0)

## 2019-07-24 MED ORDER — SODIUM CHLORIDE 0.9 % IV SOLN: INTRAVENOUS | Status: DC

## 2019-07-24 MED ORDER — POLYETHYLENE GLYCOL 3350 17 G PO PACK
17.0000 g | PACK | Freq: Two times a day (BID) | ORAL | Status: DC
  Administered 2019-07-24 – 2019-07-25 (×2): 17 g via ORAL
  Filled 2019-07-24: qty 1

## 2019-07-24 MED ORDER — IPRATROPIUM-ALBUTEROL 0.5-2.5 (3) MG/3ML IN SOLN
3.0000 mL | Freq: Three times a day (TID) | RESPIRATORY_TRACT | Status: DC
  Administered 2019-07-24 (×2): 3 mL via RESPIRATORY_TRACT
  Filled 2019-07-24 (×2): qty 3

## 2019-07-24 NOTE — Progress Notes (Signed)
PROGRESS NOTE    Deanna [name]  [id number] DOB: 1929/08/08 DOA: 07/18/2019 PCP: Ann Held, DO   Brief Narrative:  86 year old with history of TBI prior SDH/SAH requiring craniotomy, HTN, dementia initially presented to the ER with UTI and fall.  Had gone to PCP 4/15 diagnosed with UTI was given Augmentin but due to fall at home came to the ED.  There has been ongoing decline and failure to thrive over the past several months.  CT of the head in the ER was negative chest x-ray showed possible right-sided pneumonia, WBC 13 K.  CTA chest showed enlarging descending thoracic aortic aneurysm.   Assessment & Plan:   Principal Problem:   Acute lower UTI Active Problems:   Essential hypertension   Acute metabolic encephalopathy   UTI (urinary tract infection)   Cough  Acute metabolic encephalopathy; improved -Suspect from underlying infection, failure to thrive and possible dehydration -Ammonia levels normal. -On IV Unasyn due to concerns of aspiration pneumonia  Aspiration pneumonia Abnormal breath sounds on anterior chest wall and her neck area -Aspiration precautions.  Currently on IV Unasyn, can transition to Augmentin.  Supportive care. -Speech evaluation-mild aspiration risk. -No signs of active airway compromise.  Order bronchodilators 3 times daily  Urinary tract infection, asymptomatic? -Empirically received IV Rocephin.  Urine cultures has less than 10,000 colonies.  Antibiotics have been stopped for this  Enlarging descending thoracic aortic aneurysm Ascending aortic aneurysm, approximately 4.9 cm -Increased in size of thoracic aneurysm from 4.6-4.7cm to 5.1-5.4 cm. -Follows with Dr. Donnetta Hutching outpatient.  Plan against any intervention at this time  Numbness of the right foot/pain -X-rays are negative.  On low-dose gabapentin 200mg  qhs  Essential hypertension -Stable  History of dementia and prior TBI -Supportive care.  History of primary biliary  cirrhosis -Previously diagnosed at [facility].  LFTs are relatively normal.  Constipation -Stool softener  DVT prophylaxis: Lovenox Code Status: DNR Family Communication: Daughter at bedside Disposition Plan:   Patient From= home  Patient Anticipated D/C place= SNF  Barriers= maintain hospital stay until her abnormal breath sounds are shown some improvement so she can participate better in physical therapy.  Hopefully discharge tomorrow    Subjective: Feels okay this morning, has some exertional dyspnea.  Daughter at bedside.  Most of the information provided by the daughter.  Daughter tells me patient has been living with her for the past several years but has shown some cognitive decline over the course of past several months.  She is also had to push her to keep her oral hydration adequate but frequently becomes dehydrated.  Review of Systems Otherwise negative except as per HPI, including: General: Denies fever, chills, night sweats or unintended weight loss. Resp: Denies cough, wheezing, shortness of breath. Cardiac: Denies chest pain, palpitations, orthopnea, paroxysmal nocturnal dyspnea. GI: Denies abdominal pain, nausea, vomiting, diarrhea or constipation GU: Denies dysuria, frequency, hesitancy or incontinence MS: Denies muscle aches, joint pain or swelling Neuro: Denies headache, neurologic deficits (focal weakness, numbness, tingling), abnormal gait Psych: Denies anxiety, depression, SI/HI/AVH Skin: Denies new rashes or lesions ID: Denies sick contacts, exotic exposures, travel  Examination:  Constitutional: Not in acute distress, chronically ill-appearing Respiratory: Diffuse mild coarse breath sounds Cardiovascular: Normal sinus rhythm, no rubs Abdomen: Nontender nondistended good bowel sounds Musculoskeletal: No edema noted Skin: No rashes seen Neurologic: CN 2-12 grossly intact.  And nonfocal Psychiatric: Poor judgment and insight.  Alert to name and  place   Objective: Vitals:   07/23/19 1405 07/23/19 1626  07/23/19 2147 07/24/19 0534  BP: 111/72  124/74 134/75  Pulse: 66  92 84  Resp: 17  20 20   Temp: 98.6 F (37 C)  97.7 F (36.5 C) (!) 97.4 F (36.3 C)  TempSrc: Oral  Oral Oral  SpO2: 97% 92% (!) 89% 92%  Weight:      Height:        Intake/Output Summary (Last 24 hours) at 07/24/2019 0900 Last data filed at 07/23/2019 1827 Gross per 24 hour  Intake --  Output 500 ml  Net -500 ml   Filed Weights   07/18/19 1654  Weight: 100.7 kg     Data Reviewed:   CBC: Recent Labs  Lab 07/18/19 1945 07/19/19 0516 07/20/19 0530 07/21/19 0600 07/22/19 0539 07/23/19 0601 07/24/19 0557  WBC 13.4*   < > 8.7 8.5 9.8 8.7 10.4  NEUTROABS 11.6*  --   --   --   --   --   --   HGB 11.2*   < > 11.3* 11.9* 11.5* 11.4* 11.4*  HCT 36.0   < > 37.5 39.7 37.2 37.7 37.2  MCV 92.5   < > 95.7 94.7 93.5 94.3 94.2  PLT 266   < > 272 299 265 272 269   < > = values in this interval not displayed.   Basic Metabolic Panel: Recent Labs  Lab 07/20/19 0530 07/21/19 0600 07/22/19 0539 07/23/19 0601 07/24/19 0557  NA 137 138 137 137 138  K 4.1 4.1 4.3 4.2 4.5  CL 106 102 101 102 100  CO2 25 26 28 25 28   GLUCOSE 87 89 107* 101* 110*  BUN 25* 22 17 19 18   CREATININE 1.25* 1.12* 1.02* 1.00 1.02*  CALCIUM 8.6* 8.8* 8.7* 8.9 8.9   GFR: Estimated Creatinine Clearance: 49.1 mL/min (A) (by C-G formula based on SCr of 1.02 mg/dL (H)). Liver Function Tests: Recent Labs  Lab 07/22/19 0539 07/24/19 0557  AST 20 17  ALT 17 14  ALKPHOS 53 56  BILITOT 1.1 0.9  PROT 6.2* 6.4*  ALBUMIN 2.4* 2.5*   No results for input(s): LIPASE, AMYLASE in the last 168 hours. Recent Labs  Lab 07/21/19 1554  AMMONIA 35   Coagulation Profile: No results for input(s): INR, PROTIME in the last 168 hours. Cardiac Enzymes: No results for input(s): CKTOTAL, CKMB, CKMBINDEX, TROPONINI in the last 168 hours. BNP (last 3 results) No results for input(s):  PROBNP in the last 8760 hours. HbA1C: No results for input(s): HGBA1C in the last 72 hours. CBG: No results for input(s): GLUCAP in the last 168 hours. Lipid Profile: No results for input(s): CHOL, HDL, LDLCALC, TRIG, CHOLHDL, LDLDIRECT in the last 72 hours. Thyroid Function Tests: No results for input(s): TSH, T4TOTAL, FREET4, T3FREE, THYROIDAB in the last 72 hours. Anemia Panel: No results for input(s): VITAMINB12, FOLATE, FERRITIN, TIBC, IRON, RETICCTPCT in the last 72 hours. Sepsis Labs: No results for input(s): PROCALCITON, LATICACIDVEN in the last 168 hours.  Recent Results (from the past 240 hour(s))  Urine Culture     Status: None   Collection Time: 07/18/19  3:01 PM   Specimen: Urine  Result Value Ref Range Status   MICRO NUMBER: [id number]  Final   SPECIMEN QUALITY: Adequate  Final   Sample Source NOT GIVEN  Final   STATUS: FINAL  Final   Result:   Final    Growth of mixed flora was isolated, suggesting probable contamination. No further testing will be performed. If clinically indicated, recollection using  a method to minimize contamination, with prompt transfer to Urine Culture Transport Tube, is  recommended.   SARS CORONAVIRUS 2 (TAT 6-24 HRS) Nasopharyngeal Nasopharyngeal Swab     Status: None   Collection Time: 07/18/19  8:35 PM   Specimen: Nasopharyngeal Swab  Result Value Ref Range Status   SARS Coronavirus 2 NEGATIVE NEGATIVE Final    Comment: (NOTE) SARS-CoV-2 target nucleic acids are NOT DETECTED. The SARS-CoV-2 RNA is generally detectable in upper and lower respiratory specimens during the acute phase of infection. Negative results do not preclude SARS-CoV-2 infection, do not rule out co-infections with other pathogens, and should not be used as the sole basis for treatment or other patient management decisions. Negative results must be combined with clinical observations, patient history, and epidemiological information. The expected result is  Negative. Fact Sheet for Patients: SugarRoll.be Fact Sheet for Healthcare Providers: https://www.woods-mathews.com/ This test is not yet approved or cleared by the [place] FDA and  has been authorized for detection and/or diagnosis of SARS-CoV-2 by FDA under an Emergency Use Authorization (EUA). This EUA will remain  in effect (meaning this test can be used) for the duration of the COVID-19 declaration under Section 56 4(b)(1) of the Act, 21 U.S.C. section 360bbb-3(b)(1), unless the authorization is terminated or revoked sooner. Performed at [facility], [place] [address] [place], [place] [address]   Culture, blood (routine x 2)     Status: None (Preliminary result)   Collection Time: 07/18/19 10:28 PM   Specimen: BLOOD  Result Value Ref Range Status   Specimen Description   Final    BLOOD BLOOD RIGHT WRIST Performed at [place] [address], [place], [place] [address]    Special Requests   Final    BOTTLES DRAWN AEROBIC AND ANAEROBIC Blood Culture adequate volume Performed at [place] [address], [place], [place] [address]    Culture   Final    NO GROWTH 4 DAYS Performed at [facility], [place] [address], [place], [place] [address]    Report Status PENDING  Incomplete  Culture, blood (routine x 2)     Status: None (Preliminary result)   Collection Time: 07/18/19 10:28 PM   Specimen: BLOOD  Result Value Ref Range Status   Specimen Description   Final    BLOOD BLOOD LEFT HAND Performed at [facility] [address] [place], [place] [address]    Special Requests   Final    BOTTLES DRAWN AEROBIC ONLY Blood Culture adequate volume Performed at [facility] [address] [place], [place] [address]    Culture   Final    NO GROWTH 4 DAYS Performed at [facility], [place] [address] [place], [place] [address]    Report  Status PENDING  Incomplete  Culture, Urine     Status: Abnormal   Collection Time: 07/19/19  6:30 PM   Specimen: Urine, Clean Catch  Result Value Ref Range Status   Specimen Description   Final    URINE, CLEAN CATCH Performed at [facility], [place] [address] [place], [place] [address]    Special Requests   Final    NONE Performed at [facility], [place] [address] [place], [place] [address]    Culture (A)  Final    <10,000 COLONIES/mL INSIGNIFICANT GROWTH Performed at [facility] [address] [place], [place] [address]    Report Status  07/22/2019 FINAL  Final  SARS CORONAVIRUS 2 (TAT 6-24 HRS) Nasopharyngeal Nasopharyngeal Swab     Status: None   Collection Time: 07/22/19  5:47 PM   Specimen: Nasopharyngeal Swab  Result Value Ref Range Status   SARS Coronavirus 2 NEGATIVE NEGATIVE Final    Comment: (NOTE) SARS-CoV-2 target nucleic acids are NOT DETECTED. The SARS-CoV-2 RNA is generally detectable in upper and lower respiratory specimens during the acute phase of infection. Negative results do not preclude SARS-CoV-2 infection, do not rule out co-infections with other pathogens, and should not be used as the sole basis for treatment or other patient management decisions. Negative results must be combined with clinical observations, patient history, and epidemiological information. The expected result is Negative. Fact Sheet for Patients: SugarRoll.be Fact Sheet for Healthcare Providers: https://www.woods-mathews.com/ This test is not yet approved or cleared by the [place] FDA and  has been authorized for detection and/or diagnosis of SARS-CoV-2 by FDA under an Emergency Use Authorization (EUA). This EUA will remain  in effect (meaning this test can be used) for the duration of the COVID-19 declaration under Section 56 4(b)(1) of the Act, 21 U.S.C. section 360bbb-3(b)(1), unless the  authorization is terminated or revoked sooner. Performed at [facility], [place] [address], [place], [place] [address]          Radiology Studies: DG Chest 2 View  Result Date: 07/22/2019 CLINICAL DATA:  Productive cough EXAM: CHEST - 2 VIEW COMPARISON:  07/19/2019, 06/12/2019, CT chest 06/09/2016 FINDINGS: Suspected trace pleural effusions. Increasing airspace disease at the right base. Linear atelectasis or scar at the left base. Mild cardiomegaly. Tortuous aorta with aneurysmal dilatation of the arch and descending thoracic aorta, this appears more prominent on the AP view compared to the radiograph several days ago. Aortic atherosclerosis. No pneumothorax. IMPRESSION: 1. Probable trace pleural effusions with increasing atelectasis or infiltrate at the right base. Stable linear atelectasis or scar at the left base 2. Cardiomegaly. Aneurysmal dilatation of the aortic arch and proximal descending thoracic aorta which appears enlarged as compared with previous radiographs; CT angiography of the chest is recommended for further evaluation. These results will be called to the ordering clinician or representative by the Radiologist Assistant, and communication documented in the PACS or [employer]. Electronically Signed   By: Donavan Foil M.D.   On: 07/22/2019 16:43   CT ANGIO CHEST AORTA W/CM &/OR WO/CM  Result Date: 07/23/2019 CLINICAL DATA:  History of aneurysmal enlargement of the thoracic aorta with chest x-ray recently suggesting potential enlargement of the proximal descending thoracic aorta. EXAM: CT ANGIOGRAPHY CHEST WITH CONTRAST TECHNIQUE: Multidetector CT imaging of the chest was performed using the standard protocol during bolus administration of intravenous contrast. Multiplanar CT image reconstructions and MIPs were obtained to evaluate the vascular anatomy. CONTRAST:  173mL OMNIPAQUE IOHEXOL 350 MG/ML SOLN COMPARISON:  Chest x-ray on 07/22/2019 and prior CTA of the chest on  06/19/2016 FINDINGS: Cardiovascular: The aortic root measures approximately 3.8 cm at the level of the sinuses of Valsalva. The ascending thoracic aorta measures approximately 4.9 cm in greatest diameter. The proximal arch measures 3.8 cm. At the level of aneurysmal disease of the proximal descending thoracic aorta, there is some interval enlargement since the prior study with maximum diameter of approximately 5.1-5.4 cm compared to approximately 4.6-4.7 cm at a comparable level on the prior study. The dilated segment contains slightly more mural thrombus compared to the prior study. There is no evidence of aneurysm rupture or dissection. The  mid to distal descending thoracic aorta is normal in caliber and measures approximately 2.7 cm. Stable diverticulum versus ulceration along the undersurface of the aortic arch. Stable patency of proximal great vessels. The heart size is stable and within normal limits. No pericardial fluid is identified. Stable calcifications of the aortic valve and mitral annulus. Central pulmonary arteries are normal in caliber. Mediastinum/Nodes: There are some stable small scattered nonenlarged lymph nodes in the mediastinum. No enlarged axillary or hilar lymph nodes identified. Lungs/Pleura: Trace right pleural effusion and right lower lobe atelectasis. Mild atelectasis at the left lung base. No overt edema, focal nodule or pneumothorax. Upper Abdomen: No acute abnormality. Musculoskeletal: No chest wall abnormality. No acute or significant osseous findings. Review of the MIP images confirms the above findings. IMPRESSION: 1. Enlarging aneurysmal disease of the proximal descending thoracic aorta measuring approximately 5.1-5.4 cm in greatest diameter compared to approximately 4.6-4.7 cm at a comparable level on the prior study. The ascending thoracic aorta measures approximately 4.9 cm in greatest diameter. No evidence of aortic dissection or hemorrhage. 2. Stable diverticulum versus  ulceration along the undersurface of the aortic arch. 3. Trace right pleural effusion and right lower lobe atelectasis. Aortic aneurysm NOS (ICD10-I71.9). Electronically Signed   By: Aletta Edouard M.D.   On: 07/23/2019 16:18        Scheduled Meds: . amLODipine  5 mg Oral Daily  . diclofenac Sodium  2 g Topical QID  . enoxaparin (LOVENOX) injection  40 mg Subcutaneous Q24H  . fenofibrate  160 mg Oral Daily  . fluticasone  2 spray Each Nare Daily  . gabapentin  200 mg Oral QHS  . polyethylene glycol  17 g Oral Daily  . senna-docusate  1 tablet Oral BID   Continuous Infusions: . ampicillin-sulbactam (UNASYN) IV 3 g (07/24/19 0540)     LOS: 5 days   Time spent=35 mins    Nieves Barberi Arsenio Loader, MD [facility]  If 7PM-7AM, please contact night-coverage  07/24/2019, 9:00 AM

## 2019-07-24 NOTE — Progress Notes (Signed)
Occupational Therapy Treatment Patient Details Name: Deanna [name] MRN: [id number] DOB: [birth year] Today's Date: 07/24/2019    History of present illness 86 y.o. female with medical history significant of TBI prior SDH/SAH requiring craniotomy, HTN. admitted for UTI and fall.CXR ? R sided PNA.   OT comments  Pt progressing towards acute OT goals. Pt reporting need to have a bowel movement once sitting EOB so focus of session was toilet transfer EOB to/from [facility] and pericare in sit<>stand. Pt completed toileting tasks and transfer with mod - max physical A +2. Pt noted to fatigue after toileting tasks, needed max +2 to advance hips towards EOB as she was noted to initiate sitting down prematurely despite cues. BSC to EOB transfer completed as squat-pivot 2/2 fatigue at end of session. Daughter present and involved throughout session.    Follow Up Recommendations  SNF    Equipment Recommendations  None recommended by OT    Recommendations for Other Services      Precautions / Restrictions Precautions Precautions: Fall Restrictions Weight Bearing Restrictions: No       Mobility Bed Mobility Overal bed mobility: Needs Assistance Bed Mobility: Supine to Sit;Sit to Supine     Supine to sit: Mod assist Sit to supine: +2 for physical assistance;Max assist   General bed mobility comments: Pt able to advance BLE off EOB with cueing provided. assist to powerup trunk and pivot hips to full EOB position. Assist to advance BLE up on to bed at end of session and to control descent of trunk.   Transfers Overall transfer level: Needs assistance Equipment used: Rolling walker (2 wheeled) Transfers: Sit to/from [employer] Transfers Sit to Stand: +2 safety/equipment;+2 physical assistance;Mod assist;Max assist Stand pivot transfers: Mod assist;+2 physical assistance Squat pivot transfers: Max assist;+2 physical assistance     General transfer comment: initial  stand from EOB with mod +2 A. Pt fatigued quickly during session needing max +2 A to squat-pivot BSC>EOB with assist to power hips over to EOB as pt initiating sitting prematurely.     Balance Overall balance assessment: Needs assistance Sitting-balance support: Feet supported;No upper extremity supported Sitting balance-Leahy Scale: Fair       Standing balance-Leahy Scale: Poor Standing balance comment: reliant on external assist and UE support                           ADL either performed or assessed with clinical judgement   ADL Overall ADL's : Needs assistance/impaired                         Toilet Transfer: Moderate assistance;+2 for safety/equipment;+2 for physical assistance   Toileting- Clothing Manipulation and Hygiene: Moderate assistance;+2 for physical assistance;+2 for safety/equipment;Sit to/from stand         General ADL Comments: Pt completed bed mobility then pivotal steps EOB<>BSC. +2 assist, decreased safety awareness including initiating sitting prematurely while retunring to bed.     Vision       Perception     Praxis      Cognition Arousal/Alertness: Awake/alert Behavior During Therapy: Flat affect(verbally disinhibited) Overall Cognitive Status: History of cognitive impairments - at baseline                                 General Comments: daughter present and able to redirect pt as needed. needs some encouragement  to mobilize.         Exercises     Shoulder Instructions       General Comments      Pertinent Vitals/ Pain       Pain Assessment: Faces Faces Pain Scale: Hurts little more Pain Location: BLE, generalized aches/pain.  Pain Descriptors / Indicators: Discomfort;Grimacing;Sore Pain Intervention(s): Monitored during session;Limited activity within patient's tolerance;Repositioned  Home Living                                          Prior Functioning/Environment               Frequency  Min 2X/week        Progress Toward Goals  OT Goals(current goals can now be found in the care plan section)  Progress towards OT goals: Progressing toward goals  Acute Rehab OT Goals Patient Stated Goal: go home/family goals--rehab/SNF/possible long term placement OT Goal Formulation: With patient Time For Goal Achievement: 07/27/19 ADL Goals Pt Will Perform Eating: with set-up;sitting Pt Will Perform Grooming: with set-up;sitting Pt Will Transfer to Toilet: with min assist;bedside commode Pt Will Perform Toileting - Clothing Manipulation and hygiene: with min assist;sitting/lateral leans;sit to/from stand  Plan Discharge plan remains appropriate    Co-evaluation                 AM-PAC OT "6 Clicks" Daily Activity     Outcome Measure   Help from another person eating meals?: A Lot Help from another person taking care of personal grooming?: A Lot Help from another person toileting, which includes using toliet, bedpan, or urinal?: Total Help from another person bathing (including washing, rinsing, drying)?: A Lot Help from another person to put on and taking off regular upper body clothing?: Total Help from another person to put on and taking off regular lower body clothing?: Total 6 Click Score: 9    End of Session Equipment Utilized During Treatment: Gait belt;Rolling walker  OT Visit Diagnosis: Unsteadiness on feet (R26.81);Other abnormalities of gait and mobility (R26.89);Repeated falls (R29.6);Muscle weakness (generalized) (M62.81);History of falling (Z91.81)   Activity Tolerance Patient limited by fatigue;Patient tolerated treatment well;Patient limited by pain   Patient Left in bed;with call bell/phone within reach;with bed alarm set;with family/visitor present   Nurse Communication Other (comment)(nurse present in room at start of session)        Time: 1135-1200 OT Time Calculation (min): 25 min  Charges: OT General  Charges $OT Visit: 1 Visit OT Treatments $Self Care/Home Management : 23-37 mins  Tyrone Schimke, OT Acute Rehabilitation Services Pager: [phone] Office: [place], [place] 07/24/2019, 1:58 PM

## 2019-07-24 NOTE — Progress Notes (Signed)
Pt disimpacted per orders, large amount hard stool removed from rectum. Pt tolerated well.

## 2019-07-25 ENCOUNTER — Inpatient Hospital Stay (HOSPITAL_COMMUNITY): Payer: Medicare Other

## 2019-07-25 ENCOUNTER — Non-Acute Institutional Stay (SKILLED_NURSING_FACILITY): Payer: Medicare Other | Admitting: Internal Medicine

## 2019-07-25 ENCOUNTER — Other Ambulatory Visit: Payer: Self-pay | Admitting: Family Medicine

## 2019-07-25 ENCOUNTER — Encounter: Payer: Self-pay | Admitting: Internal Medicine

## 2019-07-25 DIAGNOSIS — J690 Pneumonitis due to inhalation of food and vomit: Secondary | ICD-10-CM | POA: Diagnosis not present

## 2019-07-25 DIAGNOSIS — I712 Thoracic aortic aneurysm, without rupture, unspecified: Secondary | ICD-10-CM

## 2019-07-25 DIAGNOSIS — I10 Essential (primary) hypertension: Secondary | ICD-10-CM | POA: Diagnosis not present

## 2019-07-25 DIAGNOSIS — G9341 Metabolic encephalopathy: Secondary | ICD-10-CM | POA: Diagnosis not present

## 2019-07-25 LAB — CBC
HCT: 34.1 % — ABNORMAL LOW (ref 36.0–46.0)
Hemoglobin: 10.5 g/dL — ABNORMAL LOW (ref 12.0–15.0)
MCH: 28.6 pg (ref 26.0–34.0)
MCHC: 30.8 g/dL (ref 30.0–36.0)
MCV: 92.9 fL (ref 80.0–100.0)
Platelets: 247 10*3/uL (ref 150–400)
RBC: 3.67 MIL/uL — ABNORMAL LOW (ref 3.87–5.11)
RDW: 15.4 % (ref 11.5–15.5)
WBC: 8.8 10*3/uL (ref 4.0–10.5)
nRBC: 0 % (ref 0.0–0.2)

## 2019-07-25 LAB — BASIC METABOLIC PANEL
Anion gap: 5 (ref 5–15)
BUN: 17 mg/dL (ref 8–23)
CO2: 28 mmol/L (ref 22–32)
Calcium: 8.4 mg/dL — ABNORMAL LOW (ref 8.9–10.3)
Chloride: 105 mmol/L (ref 98–111)
Creatinine, Ser: 0.99 mg/dL (ref 0.44–1.00)
GFR calc Af Amer: 59 mL/min — ABNORMAL LOW (ref >60)
GFR calc non Af Amer: 51 mL/min — ABNORMAL LOW (ref >60)
Glucose, Bld: 94 mg/dL (ref 70–99)
Potassium: 4.2 mmol/L (ref 3.5–5.1)
Sodium: 138 mmol/L (ref 135–145)

## 2019-07-25 LAB — MAGNESIUM: Magnesium: 2 mg/dL (ref 1.7–2.4)

## 2019-07-25 LAB — PROCALCITONIN: Procalcitonin: 0.12 ng/mL

## 2019-07-25 MED ORDER — SENNOSIDES-DOCUSATE SODIUM 8.6-50 MG PO TABS: 1.0000 | ORAL_TABLET | Freq: Every evening | ORAL | Status: AC | PRN

## 2019-07-25 MED ORDER — GABAPENTIN 100 MG PO CAPS: 200.0000 mg | ORAL_CAPSULE | Freq: Every day | ORAL | Status: AC

## 2019-07-25 MED ORDER — POLYETHYLENE GLYCOL 3350 17 G PO PACK: 17.0000 g | PACK | Freq: Every day | ORAL | 0 refills | Status: AC | PRN

## 2019-07-25 MED ORDER — IPRATROPIUM-ALBUTEROL 0.5-2.5 (3) MG/3ML IN SOLN: 3.0000 mL | Freq: Four times a day (QID) | RESPIRATORY_TRACT | Status: AC | PRN

## 2019-07-25 MED ORDER — FUROSEMIDE 10 MG/ML IJ SOLN: 40.0000 mg | Freq: Once | INTRAMUSCULAR | Status: DC

## 2019-07-25 MED ORDER — IPRATROPIUM-ALBUTEROL 0.5-2.5 (3) MG/3ML IN SOLN
3.0000 mL | Freq: Three times a day (TID) | RESPIRATORY_TRACT | Status: DC
  Administered 2019-07-25: 3 mL via RESPIRATORY_TRACT
  Filled 2019-07-25: qty 3

## 2019-07-25 MED ORDER — AMOXICILLIN-POT CLAVULANATE 875-125 MG PO TABS: 1.0000 | ORAL_TABLET | Freq: Two times a day (BID) | ORAL | 0 refills | Status: AC

## 2019-07-25 MED ORDER — AMOXICILLIN-POT CLAVULANATE 875-125 MG PO TABS: 1.0000 | ORAL_TABLET | Freq: Once | ORAL | Status: DC

## 2019-07-25 NOTE — TOC Transition Note (Signed)
Transition of Care [facility]) - CM/SW Discharge Note   Patient Details  Name: Deanna [name] MRN: [id number] Date of Birth: [birth year]  [facility] CM/SW Contact:  Lynnell Catalan, RN Phone Number: 07/25/2019, 11:19 AM   Clinical Narrative:     Daughters chose SNF bed at [facility]. Bed available there today. Yellow DNR on front of shadow chart for transfer. [facility] contacted for transport. RN to call report to [phone].  Final next level of care: Skilled Nursing Facility Barriers to Discharge: No Barriers Identified   Patient Goals and CMS Choice Patient states their goals for this hospitalization and ongoing recovery are:: go to rehab CMS Medicare.gov Compare Post Acute Care list provided to:: Patient Represenative (must comment)(daughters) Choice offered to / list presented to : Adult Children  Discharge Placement              Patient chooses bed at: [facility] Patient to be transferred to facility by: [facility] Name of family member notified: Levada Dy (daughter Patient and family notified of of transfer: 07/25/19  Discharge Plan and Services   Discharge Planning Services: CM Consult Post Acute Care Choice: [facility]                               Social Determinants of Health (SDOH) Interventions     Readmission Risk Interventions No flowsheet data found.

## 2019-07-25 NOTE — Care Management Important Message (Signed)
Important Message  Patient Details IM Letter given to Marney Doctor RN Case Manager to present to the Patient Name: Amary Crognale MRN: [id number] Date of Birth: 01-20-1930   Medicare Important Message Given:  Yes     Kerin Salen 07/25/2019, 10:45 AM

## 2019-07-25 NOTE — Progress Notes (Signed)
Location:    [facility] Room Number: [id number] Place of Service:  SNF [phone]) Provider:  Norval Morton, DO  Patient Care Team: Ann Held, DO as PCP - General Irine Seal, MD as Attending Physician (Urology)  Extended Emergency Contact Information Primary Emergency Contact: Lipke,Angela Address: [address]          [place], [place] [address] Johnnette Litter of [place] Phone: [phone] Mobile Phone: [phone] Relation: Daughter Secondary Emergency Contact: Lipke,Bob Address: [address]          [place], [place] [address] Johnnette Litter of [place] Phone: [phone] Mobile Phone: [phone] Relation: Relative  Code Status:  DNR Goals of care: Advanced Directive information Advanced Directives 07/25/2019  Does Patient Have a Medical Advance Directive? Yes  Type of Advance Directive Living will;Out of facility DNR (pink MOST or yellow form)  Does patient want to make changes to medical advance directive? No - Patient declined  Copy of [place] in Chart? -  Would patient like information on creating a medical advance directive? -  Pre-existing out of facility DNR order (yellow form or pink MOST form) Yellow form placed in chart (order not valid for inpatient use)     Chief Complaint  Patient presents with  . Hospitalization Follow-up    Hospitalization Follow Up   Follow-up status post hospitalization aspiration pneumonia complicated with metabolic encephalopathy  HPI:  Pt is a 86 y.o. female seen today for a hospital f/u s/p status post recent admission for what was diagnosed as aspiration pneumonia with acute metabolic encephalopathy.  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  Initially was treated for UTI and was given Augmentin but  because of increased weakness and falls came back to the ED.  CT of the head was negative for any acute process chest x-ray did show possible right-sided pneumonia with an elevated white count of 13,000.  CTA of the chest did show an enlarging descending thoracic aortic aneurysm.  She did receive hydration during hospitalization and broad-spectrum antibiotic was transitioned to IV Unasyn because of concerns of aspiration.  Her UTI was asymptomatic and did not really require treatment.  Her Unasyn has been transitioned to 3 more days of Augmentin for the aspiration pneumonia/pneumonitis.  She also had constipation during her stay and required manual disimpaction-she is on senna nightly she is also on MiraLAX as needed in addition to routine Colace.  Currently she is lying in bed comfortably-she does not have any acute complaints vital signs appear to be stable  Past Medical History:  Diagnosis Date  . AAA (abdominal aortic aneurysm) [facility]) 2011   Dr Early repaired this  . Acute ischemic colitis ([place]) 03/06/2012  . Anemia    patient denies   . Arthritis    bilateral hands and knees  . Bleeding ulcer 1980s   H. Pylori  . Blood transfusion without reported diagnosis   . Cancer ([place])    hx of skin cancer on face   . Cataract    In the past  . Chronic urinary tract infection   . Cirrhosis, biliary ([place])    liver bx 01/2008  . Diastolic CHF ([place]) [id number]  . Epistaxis   . Fatigue   . GERD (gastroesophageal reflux disease)   . HLD (hyperlipidemia)   . HTN (  hypertension)   . Hypertension   . Leg pain    bilateral  . OSA (obstructive sleep apnea)    mild on ss of 12/2008  . Pneumonia    hx of years ago   . Short-term memory loss    related to trauma of 08/2015   . Subdural hematoma ([place]) 08/2015  . Thoracic aortic aneurysm [facility])    Past Surgical History:  Procedure Laterality Date  . ABDOMINAL AORTIC ANEURYSM REPAIR  02/01/2010  . BREAST BIOPSY    . CATARACT EXTRACTION     . COLONOSCOPY  03/08/2012   Procedure: COLONOSCOPY;  Surgeon: Gatha Mayer, MD;  Location: [facility];  Service: Endoscopy;  Laterality: N/A;  . CRANIOTOMY     for evacuation of subdural hematoma - 08/29/15   . CYSTOSCOPY WITH HOLMIUM LASER LITHOTRIPSY Right 11/05/2015   Procedure: CYSTOSCOPY WITH HOLMIUM LASER LITHOTRIPSY;  Surgeon: Irine Seal, MD;  [facility];  Service: Urology;  Laterality: Right;  . CYSTOSCOPY WITH URETEROSCOPY, STONE BASKETRY AND STENT PLACEMENT Right 11/05/2015   Procedure: CYSTOSCOPY WITH URETEROSCOPY, STONE BASKETRY AND STENT EXCHANGE;  Surgeon: Irine Seal, MD;  [facility];  Service: Urology;  Laterality: Right;  . ERCP N/A 04/20/2013   Procedure: ENDOSCOPIC RETROGRADE CHOLANGIOPANCREATOGRAPHY (ERCP);  Surgeon: Ladene Artist, MD;  [facility];  Service: Endoscopy;  Laterality: N/A;  . ILIAC ARTERY - FEMORAL ARTERY BYPASS GRAFT  2011   Dr Early    Allergies  Allergen Reactions  . Keppra [Levetiracetam] Shortness Of Breath  . Crestor [Rosuvastatin] Other (See Comments)    Muscle weakness  . Ezetimibe Other (See Comments)    Muscle weakness    Allergies as of 07/25/2019      Reactions   Keppra [levetiracetam] Shortness Of Breath   Crestor [rosuvastatin] Other (See Comments)   Muscle weakness   Ezetimibe Other (See Comments)   Muscle weakness      Medication List       Accurate as of July 25, 2019  3:01 PM. If you have any questions, ask your nurse or doctor.        STOP taking these medications   NONFORMULARY OR COMPOUNDED ITEM   NONFORMULARY OR COMPOUNDED ITEM     TAKE these medications   amLODipine 5 MG tablet Commonly known as: NORVASC TAKE 1 TABLET (5 MG TOTAL) BY MOUTH DAILY. CALL TO SCHEDULE APPOINTMENT   amoxicillin-clavulanate 875-125 MG tablet Commonly known as: Augmentin Take 1 tablet by mouth 2 (two) times daily for 3 days.   docusate sodium 100 MG capsule Commonly known as: COLACE Take 100 mg by mouth daily.    fenofibrate 160 MG tablet TAKE 1 TABLET BY MOUTH EVERY DAY   fluticasone 50 MCG/ACT nasal spray Commonly known as: FLONASE Place 2 sprays into both nostrils daily.   gabapentin 100 MG capsule Commonly known as: NEURONTIN Take 2 capsules (200 mg total) by mouth at bedtime.   ipratropium-albuterol 0.5-2.5 (3) MG/3ML Soln Commonly known as: DUONEB Take 3 mLs by nebulization every 6 (six) hours as needed.   losartan 100 MG tablet Commonly known as: COZAAR TAKE 1 TABLET DAILY   nystatin cream Commonly known as: MYCOSTATIN APPLY TO AFFECTED AREA TWICE A DAY   OXYGEN Inhale 2 L into the lungs continuous.   polyethylene glycol 17 g packet Commonly known as: MIRALAX / GLYCOLAX Take 17 g by mouth daily as needed.   senna-docusate 8.6-50 MG tablet Commonly known as: Senokot-S Take 1 tablet by mouth  at bedtime as needed for mild constipation.       Review of Systems   General she not complaining of any fever or chills.  Skin does not complain of rashes or itching.  Head ears eyes nose mouth and throat is not complaining of visual changes or sore throat.-She is hard of hearing  Respiratory does have suspected aspiration per hospital diagnosis she is on oxygen-does not complain of having any increased shortness of breath beyond baseline or cough beyond baseline.  Cardiac does not complain of chest pain or increased edema from baseline.  GI does have a history of constipation is not complaining of abdominal pain nausea vomiting diarrhea or constipation at this time.  GU is not complaining of dysuria.  Musculoskeletal does have weakness especially lower extremity but is not complaining of pain at this time.  Neurologic does not complain of dizziness headache numbness or syncope  Psych does not complain of being depressed or anxious-appears somewhat anxious when she first arrived with EMS but now appears to be more comfortable  Immunization History  Administered Date(s)  Administered  . Hepatitis B 01/08/2010  . Influenza Whole 02/21/2007, 12/20/2007, 12/11/2009, 11/30/2010, 12/19/2012  . Influenza, High Dose Seasonal PF 12/14/2013, 12/31/2015, 02/08/2017, 12/22/2018  . Influenza-Unspecified 12/14/2013, 12/04/2014, 12/31/2015  . Pneumococcal Conjugate-13 06/25/2015  . Pneumococcal Polysaccharide-23 04/04/2004, 04/20/2005  . Td 04/04/2004   Pertinent  Health Maintenance Due  Topic Date Due  . INFLUENZA VACCINE  11/03/2019  . DEXA SCAN  Completed  . PNA vac Low Risk Adult  Completed   Fall Risk  11/05/2018 05/12/2017 12/31/2015 11/27/2014 05/10/2013  Falls in the past year? 0 Yes Yes No No  Comment Emmi Telephone Survey: data to providers prior to load - - - -  Number falls in past yr: - 1 1 - -  Injury with Fall? - - Yes - -  Comment - - patient report having a fall due dog tripping her and she injuried her head - -  Risk for fall due to : - Impaired mobility - - -  Follow up - Falls prevention discussed;Follow up appointment - - -   Functional Status Survey:    Vitals:   07/25/19 1448  BP: (!) 124/94  Pulse: 94  Resp: 16  Temp: (!) 97.4 F (36.3 C)  TempSrc: Oral  Weight: 222 lb (100.7 kg)  Height: 5\' 9"  (1.753 m)   Body mass index is 32.78 kg/m. Physical Exam In general this is a somewhat obese elderly female in no distress lying comfortably in bed.  Her skin is warm and dry.  Eyes visual acuity appears to be intact sclera and conjunctive are clear.  Aleene Davidson is hard of hearing  Oropharynx clear mucous membranes moist.  Chest initially had some rhonchi on expiration but with cough this cleared air entry is somewhat shallow there is no labored breathing.  Heart is regular rate and rhythm without murmur gallop rub she does have obese changes/mild edema in lower extremities.  Abdomen is obese soft nontender with positive bowel sounds.  Musculoskeletal Limited exam since she is in bed but is able to move all extremities x4 she does have  wrapping of her left lower leg.  Neurologic appears grossly intact her speech is clear could not appreciate lateralizing findings.  Psych she is pleasant and appropriate apparently does have diagnosis of mild dementia.   Labs reviewed: Recent Labs    07/23/19 0601 07/24/19 0557 07/25/19 0537  NA 137 138 138  K  4.2 4.5 4.2  CL 102 100 105  CO2 25 28 28   GLUCOSE 101* 110* 94  BUN 19 18 17   CREATININE 1.00 1.02* 0.99  CALCIUM 8.9 8.9 8.4*  MG  --   --  2.0   Recent Labs    07/22/19 0539 07/24/19 0557  AST 20 17  ALT 17 14  ALKPHOS 53 56  BILITOT 1.1 0.9  PROT 6.2* 6.4*  ALBUMIN 2.4* 2.5*   Recent Labs    07/18/19 1945 07/19/19 0516 07/23/19 0601 07/24/19 0557 07/25/19 0537  WBC 13.4*   < > 8.7 10.4 8.8  NEUTROABS 11.6*  --   --   --   --   HGB 11.2*   < > 11.4* 11.4* 10.5*  HCT 36.0   < > 37.7 37.2 34.1*  MCV 92.5   < > 94.3 94.2 92.9  PLT 266   < > 272 269 247   < > = values in this interval not displayed.   Lab Results  Component Value Date   TSH 3.97 09/26/2017   Lab Results  Component Value Date   HGBA1C 5.5 06/24/2011   Lab Results  Component Value Date   CHOL 165 09/26/2017   HDL 26.20 (L) 09/26/2017   LDLCALC 82 05/12/2017   LDLDIRECT 97.0 09/26/2017   TRIG 218.0 (H) 09/26/2017   CHOLHDL 6 09/26/2017    Significant Diagnostic Results in last 30 days:  DG Chest 2 View  Result Date: 07/19/2019 CLINICAL DATA:  Cough. EXAM: CHEST - 2 VIEW COMPARISON:  July 19, 2019. June 12, 2018. FINDINGS: Mild cardiomegaly is noted. There is again noted aneurysmal dilatation of the thoracic aortic arch and descending thoracic aorta. No pneumothorax or pleural effusion is noted. Stable mild probable left basilar scarring is noted. No acute pulmonary abnormality is noted. Bony thorax is unremarkable. IMPRESSION: Stable aneurysmal dilatation of thoracic aortic arch and descending thoracic aorta; CT angiography of the chest is recommended to allow comparison to  prior study of 2018. Stable mild left basilar scarring. No other cardiopulmonary abnormality seen. Electronically Signed   By: Marijo Conception M.D.   On: 07/19/2019 11:42   DG Chest 2 View  Result Date: 07/18/2019 CLINICAL DATA:  Cough, UTI, witnessed fall EXAM: CHEST - 2 VIEW COMPARISON:  Radiograph 06/12/2018, CTA 06/09/2016 FINDINGS: Lung volumes are low with some streaky basilar atelectatic changes. More patchy opacities noted in right lung base poorly visualized on lateral radiograph which could reflect some atelectatic change, less likely consolidation. Central vascular crowding is noted. Pulmonary vascularity remains fairly well-defined however without convincing features of edema. No pneumothorax or visible effusion. Cardiac size is stable from priors. There is a calcified and tortuous aorta with fusiform aneurysmal dilatation of the distal aortic arch to 5.8 cm best appreciated on lateral radiograph, not significantly increased in size from comparison radiograph 06/12/2018. In The osseous structures appear diffusely demineralized which may limit detection of small or nondisplaced fractures. No acute osseous or soft tissue abnormality. Degenerative changes are present in the imaged spine and shoulders. Telemetry leads overlie the chest. IMPRESSION: Low lung volumes with streaky bibasilar atelectasis. Patchy right basilar opacities poorly visualized on lateral radiograph which could reflect some atelectatic change, less likely consolidation. No acute traumatic findings in the chest. 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 Of note patient should be receiving routine aneurysmal surveillance which does not appear to have been  performed since 06/09/2016. Consider outpatient CT or MR angiography of the chest. Electronically Signed   By: Lovena Le M.D.   On: 07/18/2019 18:56   DG  Tibia/Fibula Left  Result Date: 07/18/2019 CLINICAL DATA:  Pain following fall EXAM: LEFT TIBIA AND FIBULA - 2 VIEW COMPARISON:  None. FINDINGS: Frontal and lateral views obtained. No demonstrable fracture or dislocation. Mild osteoarthritic change in the knee and ankle regions. There is popliteal artery atherosclerotic calcification. IMPRESSION: No fracture or dislocation. Osteoarthritic change in the left knee and ankle regions. Left popliteal artery atherosclerosis. Electronically Signed   By: Lowella Grip III M.D.   On: 07/18/2019 18:51   CT Head Wo Contrast  Addendum Date: 07/18/2019   ADDENDUM REPORT: 07/18/2019 20:59 ADDENDUM: The results and limitations of this exam were called by telephone at the time of interpretation on 07/18/2019 at 8:49 pm to provider [facility] , who verbally acknowledged these results. Electronically Signed   By: Lovena Le M.D.   On: 07/18/2019 20:59   Result Date: 07/18/2019 CLINICAL DATA:  Fall, loss of consciousness EXAM: CT HEAD WITHOUT CONTRAST TECHNIQUE: Contiguous axial images were obtained from the base of the skull through the vertex without intravenous contrast. COMPARISON:  CT head 09/26/2017 FINDINGS: Brain: There is extensive patient motion artifact which limits the diagnostic utility of this examination and may obscure small peripheral bleeds or extra-axial collections. This is despite multiple attempts at acquisition and restraint of the patient. No definite acute intracranial abnormality is seen. Specifically, there is no convincing evidence of acute infarction, hemorrhage, hydrocephalus, visible extra-axial collection or mass lesion/mass effect. Symmetric prominence of the ventricles, cisterns and sulci compatible with parenchymal volume loss. Patchy areas of white matter hypoattenuation are most compatible with chronic microvascular angiopathy. Vascular: Atherosclerotic calcification of the carotid siphons. No hyperdense vessel. Skull: Postsurgical  changes from prior right frontotemporal craniotomy. No complication is evident. No focal scalp swelling or hematoma. No calvarial fracture or suspicious osseous lesion. Sinuses/Orbits: Paranasal sinuses and mastoid air cells are predominantly clear. Orbital structures are unremarkable aside from prior lens extractions. Other: None IMPRESSION: Extensive patient motion artifact limits the diagnostic utility of this examination. Small peripheral abnormalities may be obscured. No definite acute intracranial abnormality is evident within the limitations of this examination detailed above. Prior right frontotemporal craniotomy. Mild parenchymal volume loss and chronic white matter changes. Electronically Signed: By: Lovena Le M.D. On: 07/18/2019 20:50   DG CHEST PORT 1 VIEW  Result Date: 07/19/2019 CLINICAL DATA:  86 year old female with history of cough. EXAM: PORTABLE CHEST 1 VIEW COMPARISON:  Chest x-ray 07/18/2019. FINDINGS: Lung volumes are low. No consolidative airspace disease. No pleural effusions. No pneumothorax. No pulmonary nodule or mass noted. Pulmonary vasculature and the cardiomediastinal silhouette are within normal limits. Aortic atherosclerosis. IMPRESSION: 1.  No radiographic evidence of acute cardiopulmonary disease. 2. Aortic atherosclerosis. Electronically Signed   By: Vinnie Langton M.D.   On: 07/19/2019 07:53   DG Swallowing Func-Speech Pathology  Result Date: 07/19/2019 Objective Swallowing Evaluation: Type of Study: MBS-Modified Barium Swallow Study  Patient Details Name: Deanna [name] MRN: [id number] Date of Birth: [birth year] Today's Date: 07/19/2019 Time: SLP Start Time (ACUTE ONLY): 1415 -SLP Stop Time (ACUTE ONLY): 1445 SLP Time Calculation (min) (ACUTE ONLY): 30 min Past Medical History: Past Medical History: Diagnosis Date . AAA (abdominal aortic aneurysm) [facility]) 2011  Dr Early repaired this . Acute ischemic colitis ([place]) 03/06/2012 . Anemia   patient denies  . Arthritis   bilateral  hands  and knees . Bleeding ulcer 1980s  H. Pylori . Blood transfusion without reported diagnosis  . Cancer ([place])   hx of skin cancer on face  . Cataract   In the past . Chronic urinary tract infection  . Cirrhosis, biliary ([place])   liver bx 01/2008 . Diastolic CHF ([place]) XX123456 . Epistaxis  . Fatigue  . GERD (gastroesophageal reflux disease)  . HLD (hyperlipidemia)  . HTN (hypertension)  . Hypertension  . Leg pain   bilateral . OSA (obstructive sleep apnea)   mild on ss of 12/2008 . Pneumonia   hx of years ago  . Short-term memory loss   related to trauma of 08/2015  . Subdural hematoma ([place]) 08/2015 . Thoracic aortic aneurysm [facility])  Past Surgical History: Past Surgical History: Procedure Laterality Date . ABDOMINAL AORTIC ANEURYSM REPAIR  02/01/2010 . BREAST BIOPSY   . CATARACT EXTRACTION   . COLONOSCOPY  03/08/2012  Procedure: COLONOSCOPY;  Surgeon: Gatha Mayer, MD;  Location: [facility];  Service: Endoscopy;  Laterality: N/A; . CRANIOTOMY    for evacuation of subdural hematoma - 08/29/15  . CYSTOSCOPY WITH HOLMIUM LASER LITHOTRIPSY Right 11/05/2015  Procedure: CYSTOSCOPY WITH HOLMIUM LASER LITHOTRIPSY;  Surgeon: Irine Seal, MD;  [facility];  Service: Urology;  Laterality: Right; . CYSTOSCOPY WITH URETEROSCOPY, STONE BASKETRY AND STENT PLACEMENT Right 11/05/2015  Procedure: CYSTOSCOPY WITH URETEROSCOPY, STONE BASKETRY AND STENT EXCHANGE;  Surgeon: Irine Seal, MD;  [facility];  Service: Urology;  Laterality: Right; . ERCP N/A 04/20/2013  Procedure: ENDOSCOPIC RETROGRADE CHOLANGIOPANCREATOGRAPHY (ERCP);  Surgeon: Ladene Artist, MD;  [facility];  Service: Endoscopy;  Laterality: N/A; . ILIAC ARTERY - FEMORAL ARTERY BYPASS GRAFT  2011  Dr Early HPI: 86yo female admitted 07/18/19 with AMS, fall at home. PMH: TBI (2017), SDH/SAH s/p crani, HTN, GERD, HLD  Subjective: Pt seen in radiology for instrumental swallow evaluation Assessment / Plan / Recommendation CHL IP CLINICAL IMPRESSIONS 07/19/2019 Clinical  Impression Pt presents with normal oropharyngeal swallow. No oral residue, no penetration or aspiration, and no post-swallow residue. Pt tolerated trials of thin liquid via cup and straw, puree, solid texture, and barium tablet with water without difficulty. Recommend continuing regular diet and thin liquids. Results and recommendations were reviewed with pt and her 2 daughters following this study. No further ST intervention recommended at this time.  Please reconsult if needs arise.  SLP Visit Diagnosis Dysphagia, oropharyngeal phase (R13.12)     Impact on safety and function Mild aspiration risk   CHL IP TREATMENT RECOMMENDATION 07/19/2019 Treatment Recommendations No treatment recommended at this time   Prognosis 07/19/2019 Prognosis for Safe Diet Advancement Good     CHL IP DIET RECOMMENDATION 07/19/2019 SLP Diet Recommendations Regular solids;Thin liquid Liquid Administration via Cup;Straw Medication Administration Whole meds with liquid Compensations Slow rate;Small sips/bites Postural Changes Seated upright at 90 degrees   CHL IP OTHER RECOMMENDATIONS 07/19/2019   Oral Care Recommendations Oral care BID     CHL IP FOLLOW UP RECOMMENDATIONS 07/19/2019 Follow up Recommendations None      CHL IP ORAL PHASE 07/19/2019 Oral Phase WFL  CHL IP PHARYNGEAL PHASE 07/19/2019 Pharyngeal Phase WFL  CHL IP CERVICAL ESOPHAGEAL PHASE 07/19/2019 Cervical Esophageal Phase WFL Celia B. Quentin Ore, [facility], [place] Speech Language Pathologist Office: [phone] Pager: [phone] Shonna Chock 07/19/2019, 3:00 PM               Assessment/Plan    #1 history of aspiration pneumonia-she was treated with Unasyn and this is  been switched to 3-day course of Augmentin-she continues on duo nebs every 6 hours as needed-also spirometry will need to be encouraged as well as her bronchodilator  She is on oxygen-at this point appears to be stable  #2 history of metabolic encephalopathy thought to be multifactorial from infection failure  to thrive dementia and dehydration again she did receive IV fluids and update BMP is pending first laboratory day next week.  3.  History of UTI?  Culture grew out only 40,000 colonies Rocephin was DC'd she did continue on the Unasyn again for the aspiration pneumonia.  4.  History of enlarged descending thoracic aortic aneurysm that was 4.6-4.7 cm and now 5.1-5.4 cm-as well as an enlarging ascending aortic aneurysm-at this point no intervention was pursued-she will need follow-up with Dr. Donnetta Hutching  #5 apparently she complained of numbness of the right foot during her hospital stay x-ray was negative she is on low-dose Neurontin 200 mg nightly apparently this is helping.  6.  Hypertension she is on Cozaar 100 mg a day Norvasc 5 mg a day-at this point appears to be stable but we will need to monitor readings blood pressure today 132/67.  7.  History of constipation apparently this was an issue in the hospital she is now on Colace routinely as well as senna-she does have MiraLAX as needed at this point will monitor.  8.  History of dementia with prior traumatic brain injury-at this point continue supportive care.  9.  History of biliary cirrhosis-this was diagnosed at [place] function tests in the hospital were largely within normal limits.  [id number]  greater than 35 minutes spent assessing patient reviewing her chart and labs and coordinating and formulating a plan of care for numerous diagnoses-of note greater than 50% of time spent coordinating a plan of care with input as noted above

## 2019-07-25 NOTE — Discharge Summary (Signed)
Physician Discharge Summary  Deanna [name] [id number] DOB: April 28, 1929 DOA: 07/18/2019  PCP: Ann Held, DO  Admit date: 07/18/2019 Discharge date: 07/25/2019  Admitted From: Home Home Disposition: SNF  Recommendations for Outpatient Follow-up:  1. Follow up with PCP in 1-2 weeks 2. Please obtain BMP/CBC in one week your next doctors visit.  3. Encourage aggressive use of incentive spirometer and flutter valve.  At least 6-10 times every hour whenever she is awake 4. Duonebs prn every 6 hrs  5. Low dose Gabapentin at bedtime.  6. 3L [place] oxygen, wean off as appropriate.    Discharge Condition: Stable CODE STATUS: DNR Diet recommendation: 2g Na  Brief/Interim Summary: 86 year old with history of TBI prior SDH/SAH requiring craniotomy, HTN, dementia initially presented to the ER with UTI and fall.  Had gone to PCP 4/15 diagnosed with UTI was given Augmentin but due to fall at home came to the ED.  There has been ongoing decline and failure to thrive over the past several months.  CT of the head in the ER was negative chest x-ray showed possible right-sided pneumonia, WBC 13 K.  CTA chest showed enlarging descending thoracic aortic aneurysm.  Patient was gently hydrated during hospitalization, initially received broad-spectrum antibiotic then transitioned to IV Unasyn due to concerns of aspiration.  Her UTI was asymptomatic therefore did not really require treatment.  Her Unasyn was transitioned to 3 more days of Augmentin to treat her aspiration pneumonia/pneumonitis.  Advised to aggressively use bronchodilators, incentive spirometer and flutter valve.  She will be discharged on 2 L nasal cannula this can be weaned off as appropriate at skilled nursing facility.  She had constipation during the hospitalization for which she required manual disimpaction.  Advised to continue using aggressive bowel regimen.   Daughter is at bedside during my evaluation, all the questions  answered.   Assessment & Plan:   Principal Problem:   Acute lower UTI Active Problems:   Essential hypertension   Acute metabolic encephalopathy   UTI (urinary tract infection)   Cough  Acute metabolic encephalopathy; improved -Suspect from underlying infection, failure to thrive and possible dehydration -Ammonia levels normal. -On IV Unasyn due to concerns of aspiration pneumonia. Transition to Augmentin PO for 3 more days.   Aspiration pneumonia Abnormal breath sounds on anterior chest wall and her neck area -Aspiration precautions.  Currently on IV Unasyn, can transition to Augmentin.  Supportive care. -Speech evaluation-mild aspiration risk. -No signs of active airway compromise.  Order bronchodilators 3 times daily prn. Aggressive use of Incentive spirometry and Flutter  Urinary tract infection, asymptomatic? -Empirically received IV Rocephin.  Urine cultures has less than 10,000 colonies.  Antibiotics have been stopped for this  Enlarging descending thoracic aortic aneurysm Ascending aortic aneurysm, approximately 4.9 cm -Increased in size of thoracic aneurysm from 4.6-4.7cm to 5.1-5.4 cm. -Follows with Dr. Donnetta Hutching outpatient.  Plan against any intervention at this time  Numbness of the right foot/pain -X-rays are negative.  On low-dose gabapentin 200mg  qhs  Essential hypertension -Stable  History of dementia and prior TBI -Supportive care.  History of primary biliary cirrhosis -Previously diagnosed at [facility].  LFTs are relatively normal.  Constipation -Stool softener. Aggressive bowel regimen.    Discharge Diagnoses:  Principal Problem:   Acute lower UTI Active Problems:   Essential hypertension   Acute metabolic encephalopathy   UTI (urinary tract infection)   Cough    Consultations:  None  Subjective: Feels ok, still having productive cough. Spoke with daugthers at  bedside and explainedthem patient's care and all the questions answered  to use Incentive spirometry and flutter valve  Discharge Exam: Vitals:   07/25/19 0600 07/25/19 0745  BP: (!) 124/94   Pulse: 94   Resp: 16   Temp: (!) 97.4 F (36.3 C)   SpO2: 92% 93%   Vitals:   07/24/19 1928 07/24/19 2139 07/25/19 0600 07/25/19 0745  BP:  (!) 84/67 (!) 124/94   Pulse:  81 94   Resp:  20 16   Temp:  97.9 F (36.6 C) (!) 97.4 F (36.3 C)   TempSrc:  Oral Oral   SpO2: 92% 97% 92% 93%  Weight:      Height:        General: Pt is alert, awake, not in acute distress, 3 L nasal cannula Cardiovascular: RRR, S1/S2 +, no rubs, no gallops Respiratory: Bibasilar crackles and rhonchi Abdominal: Soft, NT, ND, bowel sounds + Extremities: no edema, no cyanosis.  Left lower extremity dressing noted on her shin due to superficial wound  Discharge Instructions   Allergies as of 07/25/2019      Reactions   Keppra [levetiracetam] Shortness Of Breath   Crestor [rosuvastatin] Other (See Comments)   Muscle weakness   Ezetimibe Other (See Comments)   Muscle weakness      Medication List    TAKE these medications   amLODipine 5 MG tablet Commonly known as: NORVASC TAKE 1 TABLET (5 MG TOTAL) BY MOUTH DAILY. CALL TO SCHEDULE APPOINTMENT What changed: additional instructions   amoxicillin-clavulanate 875-125 MG tablet Commonly known as: Augmentin Take 1 tablet by mouth 2 (two) times daily for 3 days.   docusate sodium 100 MG capsule Commonly known as: COLACE Take 100 mg by mouth daily.   fenofibrate 160 MG tablet TAKE 1 TABLET BY MOUTH EVERY DAY   fluticasone 50 MCG/ACT nasal spray Commonly known as: FLONASE Place 2 sprays into both nostrils daily.   gabapentin 100 MG capsule Commonly known as: NEURONTIN Take 2 capsules (200 mg total) by mouth at bedtime.   ipratropium-albuterol 0.5-2.5 (3) MG/3ML Soln Commonly known as: DUONEB Take 3 mLs by nebulization every 6 (six) hours as needed.   losartan 100 MG tablet Commonly known as: COZAAR TAKE 1 TABLET  DAILY   NONFORMULARY OR COMPOUNDED [facility] bed Dx dementia , hx subdural hemorrhage   NONFORMULARY OR COMPOUNDED ITEM In and out cath  # 1   Dx mental status change, incontinence of urine   nystatin cream Commonly known as: MYCOSTATIN APPLY TO AFFECTED AREA TWICE A DAY What changed: See the new instructions.   polyethylene glycol 17 g packet Commonly known as: MIRALAX / GLYCOLAX Take 17 g by mouth daily as needed.   senna-docusate 8.6-50 MG tablet Commonly known as: Senokot-S Take 1 tablet by mouth at bedtime as needed for mild constipation.       Contact information for follow-up providers    Carollee Herter, Alferd Apa, DO. Schedule an appointment as soon as possible for a visit in 1 week(s).   Specialty: Family Medicine Contact information: [address] [place] [address] [phone]            Contact information for after-discharge care    Destination    [facility] Preferred SNF .   Service: Skilled Nursing Contact information: [address] [place] [phone]                 Allergies  Allergen Reactions  . Keppra [Levetiracetam] Shortness Of Breath  . Crestor [Rosuvastatin] Other (See Comments)    Muscle weakness  . Ezetimibe Other (See Comments)    Muscle weakness    You were cared for by a hospitalist during your hospital stay. If you have any questions about your discharge medications or the care you received while you were in the hospital after you are discharged, you can call the unit and asked to speak with the hospitalist on call if the hospitalist that took care of you is not available. Once you are discharged, your primary care physician will handle any further medical issues. Please note that no refills for any discharge medications will be authorized once you are discharged, as it is imperative that you return to your primary care physician (or establish a relationship  with a primary care physician if you do not have one) for your aftercare needs so that they can reassess your need for medications and monitor your lab values.   Procedures/Studies: DG Chest 2 View  Result Date: 07/22/2019 CLINICAL DATA:  Productive cough EXAM: CHEST - 2 VIEW COMPARISON:  07/19/2019, 06/12/2019, CT chest 06/09/2016 FINDINGS: Suspected trace pleural effusions. Increasing airspace disease at the right base. Linear atelectasis or scar at the left base. Mild cardiomegaly. Tortuous aorta with aneurysmal dilatation of the arch and descending thoracic aorta, this appears more prominent on the AP view compared to the radiograph several days ago. Aortic atherosclerosis. No pneumothorax. IMPRESSION: 1. Probable trace pleural effusions with increasing atelectasis or infiltrate at the right base. Stable linear atelectasis or scar at the left base 2. Cardiomegaly. Aneurysmal dilatation of the aortic arch and proximal descending thoracic aorta which appears enlarged as compared with previous radiographs; CT angiography of the chest is recommended for further evaluation. These results will be called to the ordering clinician or representative by the Radiologist Assistant, and communication documented in the PACS or [employer]. Electronically Signed   By: Donavan Foil M.D.   On: 07/22/2019 16:43   DG Chest 2 View  Result Date: 07/19/2019 CLINICAL DATA:  Cough. EXAM: CHEST - 2 VIEW COMPARISON:  July 19, 2019. June 12, 2018. FINDINGS: Mild cardiomegaly is noted. There is again noted aneurysmal dilatation of the thoracic aortic arch and descending thoracic aorta. No pneumothorax or pleural effusion is noted. Stable mild probable left basilar scarring is noted. No acute pulmonary abnormality is noted. Bony thorax is unremarkable. IMPRESSION: Stable aneurysmal dilatation of thoracic aortic arch and descending thoracic aorta; CT angiography of the chest is recommended to allow comparison to prior study  of 2018. Stable mild left basilar scarring. No other cardiopulmonary abnormality seen. Electronically Signed   By: Marijo Conception M.D.   On: 07/19/2019 11:42   DG Chest 2 View  Result Date: 07/18/2019 CLINICAL DATA:  Cough, UTI, witnessed fall EXAM: CHEST - 2 VIEW COMPARISON:  Radiograph 06/12/2018, CTA 06/09/2016 FINDINGS: Lung volumes are low with some streaky basilar atelectatic changes. More patchy opacities noted in right lung base poorly visualized on lateral radiograph which could reflect some atelectatic change, less likely consolidation. Central vascular crowding is noted. Pulmonary vascularity remains fairly well-defined however without convincing features of edema. No pneumothorax or visible effusion. Cardiac size is stable from priors. There is a calcified and tortuous aorta with fusiform aneurysmal dilatation of the distal aortic arch to 5.8 cm best appreciated on lateral radiograph, not significantly increased in size from comparison radiograph 06/12/2018. In The osseous structures appear diffusely demineralized which may  limit detection of small or nondisplaced fractures. No acute osseous or soft tissue abnormality. Degenerative changes are present in the imaged spine and shoulders. Telemetry leads overlie the chest. IMPRESSION: Low lung volumes with streaky bibasilar atelectasis. Patchy right basilar opacities poorly visualized on lateral radiograph which could reflect some atelectatic change, less likely consolidation. No acute traumatic findings in the chest. 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 Of note patient should be receiving routine aneurysmal surveillance which does not appear to have been performed since 06/09/2016. Consider outpatient CT or MR angiography of the chest. Electronically Signed   By: Lovena Le M.D.   On: 07/18/2019 18:56   DG Tibia/Fibula  Left  Result Date: 07/18/2019 CLINICAL DATA:  Pain following fall EXAM: LEFT TIBIA AND FIBULA - 2 VIEW COMPARISON:  None. FINDINGS: Frontal and lateral views obtained. No demonstrable fracture or dislocation. Mild osteoarthritic change in the knee and ankle regions. There is popliteal artery atherosclerotic calcification. IMPRESSION: No fracture or dislocation. Osteoarthritic change in the left knee and ankle regions. Left popliteal artery atherosclerosis. Electronically Signed   By: Lowella Grip III M.D.   On: 07/18/2019 18:51   DG Ankle 2 Views Right  Result Date: 07/21/2019 CLINICAL DATA:  Fall 3 days ago, pain to lateral and medial malleolus of RIGHT ankle. History of arthritis. EXAM: RIGHT ANKLE - 2 VIEW COMPARISON:  None. FINDINGS: Osseous alignment is normal. Ankle mortise is symmetric. No fracture line or evidence of acute avulsion fracture. Well corticated calcification underlying the medial malleolus, consistent with old injury. Visualized portions of the hindfoot and midfoot appear intact and normally aligned. Mild degenerative spurring within the midfoot. Soft tissues about the RIGHT ankle are unremarkable. IMPRESSION: 1. No acute findings. No osseous fracture or dislocation. 2. Mild degenerative spurring within the midfoot. Electronically Signed   By: Franki Cabot M.D.   On: 07/21/2019 12:45   CT Head Wo Contrast  Addendum Date: 07/18/2019   ADDENDUM REPORT: 07/18/2019 20:59 ADDENDUM: The results and limitations of this exam were called by telephone at the time of interpretation on 07/18/2019 at 8:49 pm to provider [facility] , who verbally acknowledged these results. Electronically Signed   By: Lovena Le M.D.   On: 07/18/2019 20:59   Result Date: 07/18/2019 CLINICAL DATA:  Fall, loss of consciousness EXAM: CT HEAD WITHOUT CONTRAST TECHNIQUE: Contiguous axial images were obtained from the base of the skull through the vertex without intravenous contrast. COMPARISON:  CT head  09/26/2017 FINDINGS: Brain: There is extensive patient motion artifact which limits the diagnostic utility of this examination and may obscure small peripheral bleeds or extra-axial collections. This is despite multiple attempts at acquisition and restraint of the patient. No definite acute intracranial abnormality is seen. Specifically, there is no convincing evidence of acute infarction, hemorrhage, hydrocephalus, visible extra-axial collection or mass lesion/mass effect. Symmetric prominence of the ventricles, cisterns and sulci compatible with parenchymal volume loss. Patchy areas of white matter hypoattenuation are most compatible with chronic microvascular angiopathy. Vascular: Atherosclerotic calcification of the carotid siphons. No hyperdense vessel. Skull: Postsurgical changes from prior right frontotemporal craniotomy. No complication is evident. No focal scalp swelling or hematoma. No calvarial fracture or suspicious osseous lesion. Sinuses/Orbits: Paranasal sinuses and mastoid air cells are predominantly clear. Orbital structures are unremarkable aside from prior lens extractions. Other: None IMPRESSION: Extensive patient motion artifact limits the diagnostic utility of this examination. Small peripheral abnormalities may  be obscured. No definite acute intracranial abnormality is evident within the limitations of this examination detailed above. Prior right frontotemporal craniotomy. Mild parenchymal volume loss and chronic white matter changes. Electronically Signed: By: Lovena Le M.D. On: 07/18/2019 20:50   DG CHEST PORT 1 VIEW  Result Date: 07/19/2019 CLINICAL DATA:  86 year old female with history of cough. EXAM: PORTABLE CHEST 1 VIEW COMPARISON:  Chest x-ray 07/18/2019. FINDINGS: Lung volumes are low. No consolidative airspace disease. No pleural effusions. No pneumothorax. No pulmonary nodule or mass noted. Pulmonary vasculature and the cardiomediastinal silhouette are within normal limits.  Aortic atherosclerosis. IMPRESSION: 1.  No radiographic evidence of acute cardiopulmonary disease. 2. Aortic atherosclerosis. Electronically Signed   By: Vinnie Langton M.D.   On: 07/19/2019 07:53   DG Swallowing Func-Speech Pathology  Result Date: 07/19/2019 Objective Swallowing Evaluation: Type of Study: MBS-Modified Barium Swallow Study  Patient Details Name: Rhema Loyal MRN: [id number] Date of Birth: [birth year] Today's Date: 07/19/2019 Time: SLP Start Time (ACUTE ONLY): 1415 -SLP Stop Time (ACUTE ONLY): 1445 SLP Time Calculation (min) (ACUTE ONLY): 30 min Past Medical History: Past Medical History: Diagnosis Date . AAA (abdominal aortic aneurysm) [facility]) 2011  Dr Early repaired this . Acute ischemic colitis ([place]) 03/06/2012 . Anemia   patient denies  . Arthritis   bilateral hands and knees . Bleeding ulcer 1980s  H. Pylori . Blood transfusion without reported diagnosis  . Cancer ([place])   hx of skin cancer on face  . Cataract   In the past . Chronic urinary tract infection  . Cirrhosis, biliary ([place])   liver bx 01/2008 . Diastolic CHF ([place]) [id number] . Epistaxis  . Fatigue  . GERD (gastroesophageal reflux disease)  . HLD (hyperlipidemia)  . HTN (hypertension)  . Hypertension  . Leg pain   bilateral . OSA (obstructive sleep apnea)   mild on ss of 12/2008 . Pneumonia   hx of years ago  . Short-term memory loss   related to trauma of 08/2015  . Subdural hematoma ([place]) 08/2015 . Thoracic aortic aneurysm [facility])  Past Surgical History: Past Surgical History: Procedure Laterality Date . ABDOMINAL AORTIC ANEURYSM REPAIR  02/01/2010 . BREAST BIOPSY   . CATARACT EXTRACTION   . COLONOSCOPY  03/08/2012  Procedure: COLONOSCOPY;  Surgeon: Gatha Mayer, MD;  Location: [place];  Service: Endoscopy;  Laterality: N/A; . CRANIOTOMY    for evacuation of subdural hematoma - 08/29/15  . CYSTOSCOPY WITH HOLMIUM LASER LITHOTRIPSY Right 11/05/2015  Procedure: CYSTOSCOPY WITH HOLMIUM LASER LITHOTRIPSY;  Surgeon: Irine Seal, MD;  Location:  [facility];  Service: Urology;  Laterality: Right; . CYSTOSCOPY WITH URETEROSCOPY, STONE BASKETRY AND STENT PLACEMENT Right 11/05/2015  Procedure: CYSTOSCOPY WITH URETEROSCOPY, STONE BASKETRY AND STENT EXCHANGE;  Surgeon: Irine Seal, MD;  Location: [facility];  Service: Urology;  Laterality: Right; . ERCP N/A 04/20/2013  Procedure: ENDOSCOPIC RETROGRADE CHOLANGIOPANCREATOGRAPHY (ERCP);  Surgeon: Ladene Artist, MD;  Location: [facility];  Service: Endoscopy;  Laterality: N/A; . ILIAC ARTERY - FEMORAL ARTERY BYPASS GRAFT  2011  Dr Early HPI: 86yo female admitted 07/18/19 with AMS, fall at home. PMH: TBI (2017), SDH/SAH s/p crani, HTN, GERD, HLD  Subjective: Pt seen in radiology for instrumental swallow evaluation Assessment / Plan / Recommendation CHL IP CLINICAL IMPRESSIONS 07/19/2019 Clinical Impression Pt presents with normal oropharyngeal swallow. No oral residue, no penetration or aspiration, and no post-swallow residue. Pt tolerated trials of thin liquid via cup and straw, puree, solid texture, and barium tablet with water without difficulty. Recommend  continuing regular diet and thin liquids. Results and recommendations were reviewed with pt and her 2 daughters following this study. No further ST intervention recommended at this time.  Please reconsult if needs arise.  SLP Visit Diagnosis Dysphagia, oropharyngeal phase (R13.12)     Impact on safety and function Mild aspiration risk   CHL IP TREATMENT RECOMMENDATION 07/19/2019 Treatment Recommendations No treatment recommended at this time   Prognosis 07/19/2019 Prognosis for Safe Diet Advancement Good     CHL IP DIET RECOMMENDATION 07/19/2019 SLP Diet Recommendations Regular solids;Thin liquid Liquid Administration via Cup;Straw Medication Administration Whole meds with liquid Compensations Slow rate;Small sips/bites Postural Changes Seated upright at 90 degrees   CHL IP OTHER RECOMMENDATIONS 07/19/2019   Oral Care Recommendations Oral care BID     CHL IP FOLLOW UP  RECOMMENDATIONS 07/19/2019 Follow up Recommendations None      CHL IP ORAL PHASE 07/19/2019 Oral Phase WFL  CHL IP PHARYNGEAL PHASE 07/19/2019 Pharyngeal Phase WFL  CHL IP CERVICAL ESOPHAGEAL PHASE 07/19/2019 Cervical Esophageal Phase WFL Celia B. Quentin Ore, [facility], CCC-SLP Speech Language Pathologist Office: [phone] Pager: [phone] Shonna Chock 07/19/2019, 3:00 PM              CT ANGIO CHEST AORTA W/CM &/OR WO/CM  Result Date: 07/23/2019 CLINICAL DATA:  History of aneurysmal enlargement of the thoracic aorta with chest x-ray recently suggesting potential enlargement of the proximal descending thoracic aorta. EXAM: CT ANGIOGRAPHY CHEST WITH CONTRAST TECHNIQUE: Multidetector CT imaging of the chest was performed using the standard protocol during bolus administration of intravenous contrast. Multiplanar CT image reconstructions and MIPs were obtained to evaluate the vascular anatomy. CONTRAST:  164mL OMNIPAQUE IOHEXOL 350 MG/ML SOLN COMPARISON:  Chest x-ray on 07/22/2019 and prior CTA of the chest on 06/19/2016 FINDINGS: Cardiovascular: The aortic root measures approximately 3.8 cm at the level of the sinuses of Valsalva. The ascending thoracic aorta measures approximately 4.9 cm in greatest diameter. The proximal arch measures 3.8 cm. At the level of aneurysmal disease of the proximal descending thoracic aorta, there is some interval enlargement since the prior study with maximum diameter of approximately 5.1-5.4 cm compared to approximately 4.6-4.7 cm at a comparable level on the prior study. The dilated segment contains slightly more mural thrombus compared to the prior study. There is no evidence of aneurysm rupture or dissection. The mid to distal descending thoracic aorta is normal in caliber and measures approximately 2.7 cm. Stable diverticulum versus ulceration along the undersurface of the aortic arch. Stable patency of proximal great vessels. The heart size is stable and within normal limits. No  pericardial fluid is identified. Stable calcifications of the aortic valve and mitral annulus. Central pulmonary arteries are normal in caliber. Mediastinum/Nodes: There are some stable small scattered nonenlarged lymph nodes in the mediastinum. No enlarged axillary or hilar lymph nodes identified. Lungs/Pleura: Trace right pleural effusion and right lower lobe atelectasis. Mild atelectasis at the left lung base. No overt edema, focal nodule or pneumothorax. Upper Abdomen: No acute abnormality. Musculoskeletal: No chest wall abnormality. No acute or significant osseous findings. Review of the MIP images confirms the above findings. IMPRESSION: 1. Enlarging aneurysmal disease of the proximal descending thoracic aorta measuring approximately 5.1-5.4 cm in greatest diameter compared to approximately 4.6-4.7 cm at a comparable level on the prior study. The ascending thoracic aorta measures approximately 4.9 cm in greatest diameter. No evidence of aortic dissection or hemorrhage. 2. Stable diverticulum versus ulceration along the undersurface of the aortic arch. 3. Trace right pleural  effusion and right lower lobe atelectasis. Aortic aneurysm NOS (ICD10-I71.9). Electronically Signed   By: Aletta Edouard M.D.   On: 07/23/2019 16:18      The results of significant diagnostics from this hospitalization (including imaging, microbiology, ancillary and laboratory) are listed below for reference.     Microbiology: Recent Results (from the past 240 hour(s))  Urine Culture     Status: None   Collection Time: 07/18/19  3:01 PM   Specimen: Urine  Result Value Ref Range Status   MICRO NUMBER: [id number]  Final   SPECIMEN QUALITY: Adequate  Final   Sample Source NOT GIVEN  Final   STATUS: FINAL  Final   Result:   Final    Growth of mixed flora was isolated, suggesting probable contamination. No further testing will be performed. If clinically indicated, recollection using a method to minimize contamination, with  prompt transfer to Urine Culture Transport Tube, is  recommended.   SARS CORONAVIRUS 2 (TAT 6-24 HRS) Nasopharyngeal Nasopharyngeal Swab     Status: None   Collection Time: 07/18/19  8:35 PM   Specimen: Nasopharyngeal Swab  Result Value Ref Range Status   SARS Coronavirus 2 NEGATIVE NEGATIVE Final    Comment: (NOTE) SARS-CoV-2 target nucleic acids are NOT DETECTED. The SARS-CoV-2 RNA is generally detectable in upper and lower respiratory specimens during the acute phase of infection. Negative results do not preclude SARS-CoV-2 infection, do not rule out co-infections with other pathogens, and should not be used as the sole basis for treatment or other patient management decisions. Negative results must be combined with clinical observations, patient history, and epidemiological information. The expected result is Negative. Fact Sheet for Patients: SugarRoll.be Fact Sheet for Healthcare Providers: https://www.woods-mathews.com/ This test is not yet approved or cleared by the [place] FDA and  has been authorized for detection and/or diagnosis of SARS-CoV-2 by FDA under an Emergency Use Authorization (EUA). This EUA will remain  in effect (meaning this test can be used) for the duration of the COVID-19 declaration under Section 56 4(b)(1) of the Act, 21 U.S.C. section 360bbb-3(b)(1), unless the authorization is terminated or revoked sooner. Performed at [facility], [place] [address] [place], [place] [address]   Culture, blood (routine x 2)     Status: None   Collection Time: 07/18/19 10:28 PM   Specimen: BLOOD  Result Value Ref Range Status   Specimen Description   Final    BLOOD BLOOD RIGHT WRIST Performed at [facility] [address], [place], [place] [address]    Special Requests   Final    BOTTLES DRAWN AEROBIC AND ANAEROBIC Blood Culture adequate volume Performed at [facility] [address], [place], [place] [address]    Culture   Final    NO GROWTH 5 DAYS Performed at [facility], [place] [address], [place], [place] [address]    Report Status 07/24/2019 FINAL  Final  Culture, blood (routine x 2)     Status: None   Collection Time: 07/18/19 10:28 PM   Specimen: BLOOD  Result Value Ref Range Status   Specimen Description   Final    BLOOD BLOOD LEFT HAND Performed at [facility] [address] [place], [place] [address]    Special Requests   Final    BOTTLES DRAWN AEROBIC ONLY Blood Culture adequate volume Performed at [facility] [place] [address], [place],  [address]    Culture  Final    NO GROWTH 5 DAYS Performed at [facility], [place] [address] [place], [place] [address]    Report Status 07/24/2019 FINAL  Final  Culture, Urine     Status: Abnormal   Collection Time: 07/19/19  6:30 PM   Specimen: Urine, Clean Catch  Result Value Ref Range Status   Specimen Description   Final    URINE, CLEAN CATCH Performed at [facility], [place] [address] [place], [place] [address]    Special Requests   Final    NONE Performed at [facility], [place] [address], [place], [place] [address]    Culture (A)  Final    <10,000 COLONIES/mL INSIGNIFICANT GROWTH Performed at [facility] [address], [place], [place] [address]    Report Status 07/22/2019 FINAL  Final  SARS CORONAVIRUS 2 (TAT 6-24 HRS) Nasopharyngeal Nasopharyngeal Swab     Status: None   Collection Time: 07/22/19  5:47 PM   Specimen: Nasopharyngeal Swab  Result Value Ref Range Status   SARS Coronavirus 2 NEGATIVE NEGATIVE Final    Comment: (NOTE) SARS-CoV-2 target nucleic acids are NOT DETECTED. The SARS-CoV-2 RNA is generally detectable in upper and lower respiratory specimens during the acute phase of infection. Negative results do not preclude SARS-CoV-2 infection, do not rule  out co-infections with other pathogens, and should not be used as the sole basis for treatment or other patient management decisions. Negative results must be combined with clinical observations, patient history, and epidemiological information. The expected result is Negative. Fact Sheet for Patients: SugarRoll.be Fact Sheet for Healthcare Providers: https://www.woods-mathews.com/ This test is not yet approved or cleared by the [place] FDA and  has been authorized for detection and/or diagnosis of SARS-CoV-2 by FDA under an Emergency Use Authorization (EUA). This EUA will remain  in effect (meaning this test can be used) for the duration of the COVID-19 declaration under Section 56 4(b)(1) of the Act, 21 U.S.C. section 360bbb-3(b)(1), unless the authorization is terminated or revoked sooner. Performed at [facility], [place] [address], [place], [place] [address]      Labs: BNP (last 3 results) Recent Labs    07/24/19 0557  BNP [id number]*   Basic Metabolic Panel: Recent Labs  Lab 07/21/19 0600 07/22/19 0539 07/23/19 0601 07/24/19 0557 07/25/19 0537  NA 138 137 137 138 138  K 4.1 4.3 4.2 4.5 4.2  CL 102 101 102 100 105  CO2 26 28 25 28 28   GLUCOSE 89 107* 101* 110* 94  BUN 22 17 19 18 17   CREATININE 1.12* 1.02* 1.00 1.02* 0.99  CALCIUM 8.8* 8.7* 8.9 8.9 8.4*  MG  --   --   --   --  2.0   Liver Function Tests: Recent Labs  Lab 07/22/19 0539 07/24/19 0557  AST 20 17  ALT 17 14  ALKPHOS 53 56  BILITOT 1.1 0.9  PROT 6.2* 6.4*  ALBUMIN 2.4* 2.5*   No results for input(s): LIPASE, AMYLASE in the last 168 hours. Recent Labs  Lab 07/21/19 1554  AMMONIA 35   CBC: Recent Labs  Lab 07/18/19 1945 07/19/19 0516 07/21/19 0600 07/22/19 0539 07/23/19 0601 07/24/19 0557 07/25/19 0537  WBC 13.4*   < > 8.5 9.8 8.7 10.4 8.8  NEUTROABS 11.6*  --   --   --   --   --   --   HGB 11.2*   < > 11.9* 11.5* 11.4* 11.4* 10.5*  HCT 36.0   < > 39.7 37.2 37.7 37.2 34.1*  MCV 92.5   < > 94.7 93.5 94.3 94.2 92.9  PLT 266   < > 299 265 272 269 247   < > = values in this interval not displayed.   Cardiac Enzymes: No results for input(s): CKTOTAL, CKMB, CKMBINDEX, TROPONINI in the last 168 hours. BNP: Invalid input(s): POCBNP CBG: No results for input(s): GLUCAP in the last 168 hours. D-Dimer No results for input(s): DDIMER in the last 72 hours. Hgb A1c No results for input(s): HGBA1C in the last 72 hours. Lipid Profile No results for input(s): CHOL, HDL, LDLCALC, TRIG, CHOLHDL, LDLDIRECT in the last 72 hours. Thyroid function studies No results for input(s): TSH, T4TOTAL, T3FREE, THYROIDAB in the last 72 hours.  Invalid input(s): FREET3 Anemia work up No results for input(s): VITAMINB12, FOLATE, FERRITIN, TIBC, IRON, RETICCTPCT in the last 72 hours. Urinalysis    Component Value Date/Time   COLORURINE ORANGE (A) 04/20/2013 1515   APPEARANCEUR CLOUDY (A) 04/20/2013 1515   LABSPEC >1.046 (H) 04/20/2013 1515   PHURINE 5.5 04/20/2013 1515   GLUCOSEU NEGATIVE 04/20/2013 1515   HGBUR TRACE (A) 04/20/2013 1515   HGBUR negative 06/15/2010 0943   BILIRUBINUR negative 07/18/2019 1448   KETONESUR NEGATIVE 04/20/2013 1515   PROTEINUR Positive (A) 07/18/2019 1448   PROTEINUR NEGATIVE 04/20/2013 1515   UROBILINOGEN 0.2 07/18/2019 1448   UROBILINOGEN 2.0 (H) 04/20/2013 1515   NITRITE postive 07/18/2019 1448   NITRITE POSITIVE (A) 04/20/2013 1515   LEUKOCYTESUR Large (3+) (A) 07/18/2019 1448   Sepsis Labs Invalid input(s): PROCALCITONIN,  WBC,  LACTICIDVEN Microbiology Recent Results (from the past 240 hour(s))  Urine Culture     Status: None   Collection Time: 07/18/19  3:01 PM   Specimen: Urine  Result Value Ref Range Status   MICRO NUMBER: [id number]  Final   SPECIMEN QUALITY: Adequate  Final   Sample Source NOT GIVEN  Final   STATUS: FINAL  Final   Result:   Final    Growth of mixed flora was isolated,  suggesting probable contamination. No further testing will be performed. If clinically indicated, recollection using a method to minimize contamination, with prompt transfer to Urine Culture Transport Tube, is  recommended.   SARS CORONAVIRUS 2 (TAT 6-24 HRS) Nasopharyngeal Nasopharyngeal Swab     Status: None   Collection Time: 07/18/19  8:35 PM   Specimen: Nasopharyngeal Swab  Result Value Ref Range Status   SARS Coronavirus 2 NEGATIVE NEGATIVE Final    Comment: (NOTE) SARS-CoV-2 target nucleic acids are NOT DETECTED. The SARS-CoV-2 RNA is generally detectable in upper and lower respiratory specimens during the acute phase of infection. Negative results do not preclude SARS-CoV-2 infection, do not rule out co-infections with other pathogens, and should not be used as the sole basis for treatment or other patient management decisions. Negative results must be combined with clinical observations, patient history, and epidemiological information. The expected result is Negative. Fact Sheet for Patients: SugarRoll.be Fact Sheet for Healthcare Providers: https://www.woods-mathews.com/ This test is not yet approved or cleared by the [place] FDA and  has been authorized for detection and/or diagnosis of SARS-CoV-2 by FDA under an Emergency Use Authorization (EUA). This EUA will remain  in effect (meaning this test can be used) for the duration of the COVID-19 declaration under Section 56 4(b)(1) of the Act, 21 U.S.C. section 360bbb-3(b)(1), unless the authorization is terminated or revoked sooner. Performed at [facility], [address] [place], [place] [address]   Culture, blood (routine x 2)     Status: None   Collection Time: 07/18/19 10:28 PM   Specimen: BLOOD  Result Value Ref Range Status   Specimen Description   Final    BLOOD BLOOD RIGHT WRIST Performed at [facility] [address], [place],  [place] [address]    Special Requests   Final    BOTTLES DRAWN AEROBIC AND ANAEROBIC Blood Culture adequate volume Performed at [facility] [address], [place], [place] [address]    Culture   Final    NO GROWTH 5 DAYS Performed at [facility], [place] [address], [place], [place] [address]    Report Status 07/24/2019 FINAL  Final  Culture, blood (routine x 2)     Status: None   Collection Time: 07/18/19 10:28 PM   Specimen: BLOOD  Result Value Ref Range Status   Specimen Description   Final    BLOOD BLOOD LEFT HAND Performed at [facility] [address] [place], [place] [address]    Special Requests   Final    BOTTLES DRAWN AEROBIC ONLY Blood Culture adequate volume Performed at [facility] [address] [place], [place] [address]    Culture   Final    NO GROWTH 5 DAYS Performed at [facility], [place] [address] [place], [place] [address]    Report Status 07/24/2019 FINAL  Final  Culture, Urine     Status: Abnormal   Collection Time: 07/19/19  6:30 PM   Specimen: Urine, Clean Catch  Result Value Ref Range Status   Specimen Description   Final    URINE, CLEAN CATCH Performed at [facility], [place] [address], [place], [place] [address]    Special Requests   Final    NONE Performed at [facility], [place] [address], [place], [place] [address]    Culture (A)  Final    <10,000 COLONIES/mL INSIGNIFICANT GROWTH Performed at [facility] [address] [place], [place] [address]    Report Status 07/22/2019 FINAL  Final  SARS CORONAVIRUS 2 (TAT 6-24 HRS) Nasopharyngeal Nasopharyngeal Swab     Status: None   Collection Time: 07/22/19  5:47 PM   Specimen: Nasopharyngeal Swab  Result Value Ref Range Status   SARS Coronavirus 2 NEGATIVE NEGATIVE Final    Comment: (NOTE) SARS-CoV-2 target nucleic acids are NOT DETECTED. The SARS-CoV-2 RNA is generally detectable in  upper and lower respiratory specimens during the acute phase of infection. Negative results do not preclude SARS-CoV-2 infection, do not rule out co-infections with other pathogens, and should not be used as the sole basis for treatment or other patient management decisions. Negative results must be combined with clinical observations, patient history, and epidemiological information. The expected result is Negative. Fact Sheet for Patients: SugarRoll.be Fact Sheet for Healthcare Providers: https://www.woods-mathews.com/ This test is not yet approved or cleared by the [place] FDA and  has been authorized for detection and/or diagnosis of SARS-CoV-2 by FDA under an Emergency Use Authorization (EUA). This EUA will remain  in effect (meaning this test can be used) for the duration of the COVID-19 declaration under Section 56 4(b)(1) of the Act, 21 U.S.C. section 360bbb-3(b)(1), unless the authorization is terminated or revoked sooner. Performed at [facility], [place] [address], [place], [place] [address]      Time coordinating discharge:  I have spent 35 minutes face to face with the patient and on the ward discussing the patients care, assessment, plan and disposition with other care givers. >50% of the time was devoted counseling the patient about the risks and benefits of treatment/Discharge disposition and coordinating care.   SIGNED:   Damita Lack, MD  [facility] 07/25/2019, 9:37 AM   If 7PM-7AM, please contact night-coverage

## 2019-07-25 NOTE — Progress Notes (Signed)
Patient discharged to [facility] via [facility], report called to [place] at [facility].

## 2019-07-26 ENCOUNTER — Encounter: Payer: Self-pay | Admitting: Internal Medicine

## 2019-07-26 ENCOUNTER — Non-Acute Institutional Stay (SKILLED_NURSING_FACILITY): Payer: Medicare Other | Admitting: Internal Medicine

## 2019-07-26 DIAGNOSIS — I712 Thoracic aortic aneurysm, without rupture, unspecified: Secondary | ICD-10-CM

## 2019-07-26 DIAGNOSIS — J690 Pneumonitis due to inhalation of food and vomit: Secondary | ICD-10-CM

## 2019-07-26 DIAGNOSIS — E785 Hyperlipidemia, unspecified: Secondary | ICD-10-CM

## 2019-07-26 DIAGNOSIS — G629 Polyneuropathy, unspecified: Secondary | ICD-10-CM | POA: Diagnosis not present

## 2019-07-26 DIAGNOSIS — K745 Biliary cirrhosis, unspecified: Secondary | ICD-10-CM | POA: Diagnosis not present

## 2019-07-26 DIAGNOSIS — I7121 Aneurysm of the ascending aorta, without rupture: Secondary | ICD-10-CM

## 2019-07-26 DIAGNOSIS — I10 Essential (primary) hypertension: Secondary | ICD-10-CM

## 2019-07-26 NOTE — Progress Notes (Signed)
: Provider:  Hennie Duos., MD Location:  [facility] Room Number: [phone] Place of Service:  SNF ([phone])  PCP: Ann Held, DO Patient Care Team: Ann Held, DO as PCP - General Irine Seal, MD as Attending Physician (Urology)  Extended Emergency Contact Information Primary Emergency Contact: Lipke,Angela Address: [id number]          [place], [place] [address] [place] Phone: [phone] Mobile Phone: [phone] Relation: Daughter Secondary Emergency Contact: Lipke,Bob Address: [address] [place]          [place], [place] [address] [place] of [place] Phone: [phone] Mobile Phone: [phone] Relation: Relative     Allergies: Keppra [levetiracetam], Crestor [rosuvastatin], and Ezetimibe  Chief Complaint  Patient presents with  . New Admit To SNF    New admission to [facility] SNF    HPI: Patient is a 86 y.o. female history of TBI, prior SDH/SAH requiring craniotomy, hypertension, dementia, presented to the ER with a UTI and fall.  There has been an ongoing decline and failure to thrive over the prior several months.  CT head in the ER was negative, chest x-ray showed possible right-sided pneumonia, WBC 13 K CT chest shows enlarging descending thoracic aortic aneurysm.  Patient received initially broad-spectrum antibiotic been IV Unasyn due to concern for aspiration pneumonia.  Patient possible UTI was asymptomatic and therefore did not require treatment.  Her Unasyn was transitioned to 3 more days of Augmentin.  Patient will be discharged on 2 L nasal cannula to wean off as appropriate.  Patient is admitted to skilled nursing facility for OT/PT.  While at skilled nursing facility patient will be followed for hypertension treated with losartan and Norvasc., hyperlipidemia treated with fenofibrate, and polyneuropathy treated with Neurontin.  Past Medical History:  Diagnosis Date  . AAA (abdominal  aortic aneurysm) [facility]) 2011   Dr Early repaired this  . Acute ischemic colitis ([place]) 03/06/2012  . Anemia    patient denies   . Arthritis    bilateral hands and knees  . Bleeding ulcer 1980s   H. Pylori  . Blood transfusion without reported diagnosis   . Cancer ([place])    hx of skin cancer on face   . Cataract    In the past  . Chronic urinary tract infection   . Cirrhosis, biliary ([place])    liver bx 01/2008  . Diastolic CHF ([place]) [id number]  . Epistaxis   . Fatigue   . GERD (gastroesophageal reflux disease)   . HLD (hyperlipidemia)   . HTN (hypertension)   . Hypertension   . Leg pain    bilateral  . OSA (obstructive sleep apnea)    mild on ss of 12/2008  . Pneumonia    hx of years ago   . Short-term memory loss    related to trauma of 08/2015   . Subdural hematoma ([place]) 08/2015  . Thoracic aortic aneurysm [facility])     Past Surgical History:  Procedure Laterality Date  . ABDOMINAL AORTIC ANEURYSM REPAIR  02/01/2010  . BREAST BIOPSY    . CATARACT EXTRACTION    . COLONOSCOPY  03/08/2012   Procedure: COLONOSCOPY;  Surgeon: Gatha Mayer, MD;  Location: [facility];  Service: Endoscopy;  Laterality: N/A;  . CRANIOTOMY     for evacuation of subdural hematoma - 08/29/15   . CYSTOSCOPY WITH HOLMIUM LASER LITHOTRIPSY Right 11/05/2015   Procedure: CYSTOSCOPY WITH HOLMIUM LASER LITHOTRIPSY;  Surgeon: Irine Seal, MD;  [facility];  Service: Urology;  Laterality: Right;  . CYSTOSCOPY WITH URETEROSCOPY, STONE BASKETRY AND STENT PLACEMENT Right 11/05/2015   Procedure: CYSTOSCOPY WITH URETEROSCOPY, STONE BASKETRY AND STENT EXCHANGE;  Surgeon: Irine Seal, MD;  [facility];  Service: Urology;  Laterality: Right;  . ERCP N/A 04/20/2013   Procedure: ENDOSCOPIC RETROGRADE CHOLANGIOPANCREATOGRAPHY (ERCP);  Surgeon: Ladene Artist, MD;  [facility];  Service: Endoscopy;  Laterality: N/A;  . ILIAC ARTERY - FEMORAL ARTERY BYPASS GRAFT  2011   Dr Early    Allergies as of 07/26/2019       Reactions   Keppra [levetiracetam] Shortness Of Breath   Crestor [rosuvastatin] Other (See Comments)   Muscle weakness   Ezetimibe Other (See Comments)   Muscle weakness      Medication List       Accurate as of July 26, 2019  2:31 PM. If you have any questions, ask your nurse or doctor.        amLODipine 5 MG tablet Commonly known as: NORVASC TAKE 1 TABLET (5 MG TOTAL) BY MOUTH DAILY. CALL TO SCHEDULE APPOINTMENT   amoxicillin-clavulanate 875-125 MG tablet Commonly known as: Augmentin Take 1 tablet by mouth 2 (two) times daily for 3 days.   docusate sodium 100 MG capsule Commonly known as: COLACE Take 100 mg by mouth daily.   fenofibrate 160 MG tablet TAKE 1 TABLET BY MOUTH EVERY DAY   fluticasone 50 MCG/ACT nasal spray Commonly known as: FLONASE Place 2 sprays into both nostrils daily.   gabapentin 100 MG capsule Commonly known as: NEURONTIN Take 2 capsules (200 mg total) by mouth at bedtime.   ipratropium-albuterol 0.5-2.5 (3) MG/3ML Soln Commonly known as: DUONEB Take 3 mLs by nebulization every 6 (six) hours as needed.   losartan 100 MG tablet Commonly known as: COZAAR TAKE 1 TABLET DAILY   nystatin cream Commonly known as: MYCOSTATIN APPLY TO AFFECTED AREA TWICE A DAY   OXYGEN Inhale 2 L into the lungs continuous.   polyethylene glycol 17 g packet Commonly known as: MIRALAX / GLYCOLAX Take 17 g by mouth daily as needed.   senna-docusate 8.6-50 MG tablet Commonly known as: Senokot-S Take 1 tablet by mouth at bedtime as needed for mild constipation.       No orders of the defined types were placed in this encounter.   Immunization History  Administered Date(s) Administered  . Hepatitis B 01/08/2010  . Influenza Whole 02/21/2007, 12/20/2007, 12/11/2009, 11/30/2010, 12/19/2012  . Influenza, High Dose Seasonal PF 12/14/2013, 12/31/2015, 02/08/2017, 12/22/2018  . Influenza-Unspecified 12/14/2013, 12/04/2014, 12/31/2015  . Pneumococcal  Conjugate-13 06/25/2015  . Pneumococcal Polysaccharide-23 04/04/2004, 04/20/2005  . Td 04/04/2004    Social History   Tobacco Use  . Smoking status: Former Smoker    Years: 52.00    Types: Cigarettes    Quit date: 02/01/2010    Years since quitting: 9.4  . Smokeless tobacco: Never Used  Substance Use Topics  . Alcohol use: No    Alcohol/week: 0.0 standard drinks    Family history is   Family History  Problem Relation Age of Onset  . Cancer Brother        prostate      Review of Systems  GENERAL:  no fevers, fatigue, appetite changes SKIN: No itching, or rash EYES: No eye pain, redness, discharge EARS: No earache, tinnitus, change in hearing NOSE: No congestion, drainage or bleeding  MOUTH/THROAT: No mouth or tooth pain, No sore  throat RESPIRATORY: No cough, wheezing, SOB CARDIAC: No chest pain, palpitations, lower extremity edema  GI: No abdominal pain, No N/V/D or constipation, No heartburn or reflux  GU: No dysuria, frequency or urgency, or incontinence  MUSCULOSKELETAL: No unrelieved bone/joint pain NEUROLOGIC: No headache, dizziness or focal weakness PSYCHIATRIC: No c/o anxiety or sadness   Vitals:   07/26/19 1421  BP: 132/67  Pulse: 67  Resp: 18  Temp: (!) 97 F (36.1 C)  SpO2: 95%    SpO2 Readings from Last 1 Encounters:  07/26/19 95%   Body mass index is 34.35 kg/m.     Physical Exam  GENERAL APPEARANCE: Alert, conversant,  No acute distress.  SKIN: No diaphoresis rash HEAD: Normocephalic, atraumatic  EYES: Conjunctiva/lids clear. Pupils round, reactive. EOMs intact.  EARS: External exam WNL, canals clear. Hearing grossly normal.  NOSE: No deformity or discharge.  MOUTH/THROAT: Lips w/o lesions  RESPIRATORY: Breathing is even, unlabored. Lung sounds are clear   CARDIOVASCULAR: Heart RRR no murmurs, rubs or gallops. No peripheral edema.   GASTROINTESTINAL: Abdomen is soft, non-tender, not distended w/ normal bowel sounds. GENITOURINARY:  Bladder non tender, not distended  MUSCULOSKELETAL: No abnormal joints or musculature NEUROLOGIC:  Cranial nerves 2-12 grossly intact. Moves all extremities  PSYCHIATRIC: Mood and affect appropriate with dementia no indication yet, no behavioral issues  Patient Active Problem List   Diagnosis Date Noted  . Abnormal urine odor 07/18/2019  . Acute non-recurrent pansinusitis 07/18/2019  . Acute metabolic encephalopathy [id number]  . UTI (urinary tract infection) 07/18/2019  . Cough 07/18/2019  . Bronchitis 06/12/2018  . Acute alteration in mental status 09/26/2017  . Hyperlipidemia LDL goal <100 09/26/2017  . Open wnd of scalp 05/12/2017  . Fatigue 05/12/2017  . Calculi, ureter 09/04/2015  . Acute lower UTI 09/04/2015  . Biliary and gallbladder disorder 09/02/2015  . Aneurysm of thoracic aorta ([place]) 09/02/2015  . Subdural hematoma ([place]) 08/29/2015  . Subdural hemorrhage ([place]) 08/29/2015  . Injury inflicted to the body by an external force 08/29/2015  . Pain in the abdomen 06/11/2014  . Constipation 06/11/2014  . Preop cardiovascular exam 05/24/2013  . Obesity (BMI 30-39.9) 05/12/2013  . Pulmonary hypertension ([place]) 04/21/2013  . Diastolic CHF ([place]) [id number]  . Acute cholangitis 04/20/2013  . Dyspnea on exertion 04/20/2013  . Calculus of bile duct without mention of cholecystitis or obstruction 04/20/2013  . Nonspecific (abnormal) findings on radiological and other examination of biliary tract 04/20/2013  . Celiac artery stenosis ([place]) 05/01/2012  . Acute ischemic colitis ([place]) 03/06/2012  . Hyperkalemia 03/06/2012  . Dehydration 03/06/2012  . Leukocytosis 03/06/2012  . Leg weakness, bilateral 01/01/2012  . HYPERGLYCEMIA 06/15/2010  . SMOKER 01/29/2010  . MURMUR 01/29/2010  . ELECTROCARDIOGRAM, ABNORMAL 01/29/2010  . BILIARY CIRRHOSIS, PRIMARY 01/08/2010  . FATIGUE 06/26/2009  . EPISTAXIS 06/26/2009  . OBSTRUCTIVE SLEEP APNEA 12/19/2008  . LEG PAIN, BILATERAL 04/08/2008    . GERD 12/27/2007  . ALANINE AMINOTRANSFERASE, SERUM, ELEVATED 12/05/2007  . Nonspecific abnormal results of liver function study 07/04/2007  . Hyperlipidemia 06/13/2007  . Essential hypertension 11/06/2006      Labs reviewed: Basic Metabolic Panel:    Component Value Date/Time   NA 138 07/25/2019 0537   NA 137 09/10/2015 0000   K 4.2 07/25/2019 0537   CL 105 07/25/2019 0537   CO2 28 07/25/2019 0537   GLUCOSE 94 07/25/2019 0537   BUN 17 07/25/2019 0537   BUN 23 (A) 09/10/2015 0000   CREATININE 0.99 07/25/2019 0537  CREATININE 1.27 (H) 06/22/2015 1609   CALCIUM 8.4 (L) 07/25/2019 0537   PROT 6.4 (L) 07/24/2019 0557   ALBUMIN 2.5 (L) 07/24/2019 0557   AST 17 07/24/2019 0557   ALT 14 07/24/2019 0557   ALKPHOS 56 07/24/2019 0557   BILITOT 0.9 07/24/2019 0557   GFRNONAA 51 (L) 07/25/2019 0537   GFRAA 59 (L) 07/25/2019 0537    Recent Labs    07/23/19 0601 07/24/19 0557 07/25/19 0537  NA 137 138 138  K 4.2 4.5 4.2  CL 102 100 105  CO2 25 28 28   GLUCOSE 101* 110* 94  BUN 19 18 17   CREATININE 1.00 1.02* 0.99  CALCIUM 8.9 8.9 8.4*  MG  --   --  2.0   Liver Function Tests: Recent Labs    07/22/19 0539 07/24/19 0557  AST 20 17  ALT 17 14  ALKPHOS 53 56  BILITOT 1.1 0.9  PROT 6.2* 6.4*  ALBUMIN 2.4* 2.5*   No results for input(s): LIPASE, AMYLASE in the last 8760 hours. Recent Labs    07/21/19 1554  AMMONIA 35   CBC: Recent Labs    07/18/19 1945 07/19/19 0516 07/23/19 0601 07/24/19 0557 07/25/19 0537  WBC 13.4*   < > 8.7 10.4 8.8  NEUTROABS 11.6*  --   --   --   --   HGB 11.2*   < > 11.4* 11.4* 10.5*  HCT 36.0   < > 37.7 37.2 34.1*  MCV 92.5   < > 94.3 94.2 92.9  PLT 266   < > 272 269 247   < > = values in this interval not displayed.   Lipid No results for input(s): CHOL, HDL, LDLCALC, TRIG in the last 8760 hours.  Cardiac Enzymes: No results for input(s): CKTOTAL, CKMB, CKMBINDEX, TROPONINI in the last 8760 hours. BNP: Recent Labs     07/24/19 0557  BNP 171.1*   No results found for: [facility] Lab Results  Component Value Date   HGBA1C 5.5 06/24/2011   Lab Results  Component Value Date   TSH 3.97 09/26/2017   Lab Results  Component Value Date   [id number] 11/27/2014   Lab Results  Component Value Date   FOLATE >20.0 ng/mL 06/26/2009   Lab Results  Component Value Date   IRON 103 12/05/2007   FERRITIN 131.2 12/05/2007    Imaging and Procedures obtained prior to SNF admission: DG Chest 2 View  Result Date: 07/19/2019 CLINICAL DATA:  Cough. EXAM: CHEST - 2 VIEW COMPARISON:  July 19, 2019. June 12, 2018. FINDINGS: Mild cardiomegaly is noted. There is again noted aneurysmal dilatation of the thoracic aortic arch and descending thoracic aorta. No pneumothorax or pleural effusion is noted. Stable mild probable left basilar scarring is noted. No acute pulmonary abnormality is noted. Bony thorax is unremarkable. IMPRESSION: Stable aneurysmal dilatation of thoracic aortic arch and descending thoracic aorta; CT angiography of the chest is recommended to allow comparison to prior study of 2018. Stable mild left basilar scarring. No other cardiopulmonary abnormality seen. Electronically Signed   By: Marijo Conception M.D.   On: 07/19/2019 11:42   DG Chest 2 View  Result Date: 07/18/2019 CLINICAL DATA:  Cough, UTI, witnessed fall EXAM: CHEST - 2 VIEW COMPARISON:  Radiograph 06/12/2018, CTA 06/09/2016 FINDINGS: Lung volumes are low with some streaky basilar atelectatic changes. More patchy opacities noted in right lung base poorly visualized on lateral radiograph which could reflect some atelectatic change, less likely consolidation. Central vascular crowding is  noted. Pulmonary vascularity remains fairly well-defined however without convincing features of edema. No pneumothorax or visible effusion. Cardiac size is stable from priors. There is a calcified and tortuous aorta with fusiform aneurysmal dilatation of the distal  aortic arch to 5.8 cm best appreciated on lateral radiograph, not significantly increased in size from comparison radiograph 06/12/2018. In The osseous structures appear diffusely demineralized which may limit detection of small or nondisplaced fractures. No acute osseous or soft tissue abnormality. Degenerative changes are present in the imaged spine and shoulders. Telemetry leads overlie the chest. IMPRESSION: Low lung volumes with streaky bibasilar atelectasis. Patchy right basilar opacities poorly visualized on lateral radiograph which could reflect some atelectatic change, less likely consolidation. No acute traumatic findings in the chest. 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 Of note patient should be receiving routine aneurysmal surveillance which does not appear to have been performed since 06/09/2016. Consider outpatient CT or MR angiography of the chest. Electronically Signed   By: Lovena Le M.D.   On: 07/18/2019 18:56   DG Tibia/Fibula Left  Result Date: 07/18/2019 CLINICAL DATA:  Pain following fall EXAM: LEFT TIBIA AND FIBULA - 2 VIEW COMPARISON:  None. FINDINGS: Frontal and lateral views obtained. No demonstrable fracture or dislocation. Mild osteoarthritic change in the knee and ankle regions. There is popliteal artery atherosclerotic calcification. IMPRESSION: No fracture or dislocation. Osteoarthritic change in the left knee and ankle regions. Left popliteal artery atherosclerosis. Electronically Signed   By: Lowella Grip III M.D.   On: 07/18/2019 18:51   CT Head Wo Contrast  Addendum Date: 07/18/2019   ADDENDUM REPORT: 07/18/2019 20:59 ADDENDUM: The results and limitations of this exam were called by telephone at the time of interpretation on 07/18/2019 at 8:49 pm to provider [facility] , who verbally acknowledged these results. Electronically Signed   By:  Lovena Le M.D.   On: 07/18/2019 20:59   Result Date: 07/18/2019 CLINICAL DATA:  Fall, loss of consciousness EXAM: CT HEAD WITHOUT CONTRAST TECHNIQUE: Contiguous axial images were obtained from the base of the skull through the vertex without intravenous contrast. COMPARISON:  CT head 09/26/2017 FINDINGS: Brain: There is extensive patient motion artifact which limits the diagnostic utility of this examination and may obscure small peripheral bleeds or extra-axial collections. This is despite multiple attempts at acquisition and restraint of the patient. No definite acute intracranial abnormality is seen. Specifically, there is no convincing evidence of acute infarction, hemorrhage, hydrocephalus, visible extra-axial collection or mass lesion/mass effect. Symmetric prominence of the ventricles, cisterns and sulci compatible with parenchymal volume loss. Patchy areas of white matter hypoattenuation are most compatible with chronic microvascular angiopathy. Vascular: Atherosclerotic calcification of the carotid siphons. No hyperdense vessel. Skull: Postsurgical changes from prior right frontotemporal craniotomy. No complication is evident. No focal scalp swelling or hematoma. No calvarial fracture or suspicious osseous lesion. Sinuses/Orbits: Paranasal sinuses and mastoid air cells are predominantly clear. Orbital structures are unremarkable aside from prior lens extractions. Other: None IMPRESSION: Extensive patient motion artifact limits the diagnostic utility of this examination. Small peripheral abnormalities may be obscured. No definite acute intracranial abnormality is evident within the limitations of this examination detailed above. Prior right frontotemporal craniotomy. Mild parenchymal volume loss and chronic white matter changes. Electronically Signed: By: Lovena Le M.D. On: 07/18/2019 20:50   DG CHEST PORT 1 VIEW  Result Date: 07/19/2019 CLINICAL DATA:  86 year old female with history of cough.  EXAM: PORTABLE CHEST 1 VIEW COMPARISON:  Chest x-ray 07/18/2019. FINDINGS: Lung volumes are low. No consolidative airspace disease. No pleural effusions. No pneumothorax. No pulmonary nodule or mass noted. Pulmonary vasculature and the cardiomediastinal silhouette are within normal limits. Aortic atherosclerosis. IMPRESSION: 1.  No radiographic evidence of acute cardiopulmonary disease. 2. Aortic atherosclerosis. Electronically Signed   By: Vinnie Langton M.D.   On: 07/19/2019 07:53   DG Swallowing Func-Speech Pathology  Result Date: 07/19/2019 Objective Swallowing Evaluation: Type of Study: MBS-Modified Barium Swallow Study  Patient Details Name: Monifah Nardiello MRN: [id number] Date of Birth: 1929-11-27 Today's Date: 07/19/2019 Time: SLP Start Time (ACUTE ONLY): 1415 -SLP Stop Time (ACUTE ONLY): 1445 SLP Time Calculation (min) (ACUTE ONLY): 30 min Past Medical History: Past Medical History: Diagnosis Date . AAA (abdominal aortic aneurysm) [facility]) 2011  Dr Early repaired this . Acute ischemic colitis ([place]) 03/06/2012 . Anemia   patient denies  . Arthritis   bilateral hands and knees . Bleeding ulcer 1980s  H. Pylori . Blood transfusion without reported diagnosis  . Cancer ([place])   hx of skin cancer on face  . Cataract   In the past . Chronic urinary tract infection  . Cirrhosis, biliary ([place])   liver bx 01/2008 . Diastolic CHF ([place]) [id number] . Epistaxis  . Fatigue  . GERD (gastroesophageal reflux disease)  . HLD (hyperlipidemia)  . HTN (hypertension)  . Hypertension  . Leg pain   bilateral . OSA (obstructive sleep apnea)   mild on ss of 12/2008 . Pneumonia   hx of years ago  . Short-term memory loss   related to trauma of 08/2015  . Subdural hematoma ([place]) 08/2015 . Thoracic aortic aneurysm [facility])  Past Surgical History: Past Surgical History: Procedure Laterality Date . ABDOMINAL AORTIC ANEURYSM REPAIR  02/01/2010 . BREAST BIOPSY   . CATARACT EXTRACTION   . COLONOSCOPY  03/08/2012  Procedure: COLONOSCOPY;  Surgeon: Gatha Mayer, MD;  Location: [place];  Service: Endoscopy;  Laterality: N/A; . CRANIOTOMY    for evacuation of subdural hematoma - 08/29/15  . CYSTOSCOPY WITH HOLMIUM LASER LITHOTRIPSY Right 11/05/2015  Procedure: CYSTOSCOPY WITH HOLMIUM LASER LITHOTRIPSY;  Surgeon: Irine Seal, MD;  [facility];  Service: Urology;  Laterality: Right; . CYSTOSCOPY WITH URETEROSCOPY, STONE BASKETRY AND STENT PLACEMENT Right 11/05/2015  Procedure: CYSTOSCOPY WITH URETEROSCOPY, STONE BASKETRY AND STENT EXCHANGE;  Surgeon: Irine Seal, MD;  [facility];  Service: Urology;  Laterality: Right; . ERCP N/A 04/20/2013  Procedure: ENDOSCOPIC RETROGRADE CHOLANGIOPANCREATOGRAPHY (ERCP);  Surgeon: Ladene Artist, MD;  [facility];  Service: Endoscopy;  Laterality: N/A; . ILIAC ARTERY - FEMORAL ARTERY BYPASS GRAFT  2011  Dr Early HPI: 86yo female admitted 07/18/19 with AMS, fall at home. PMH: TBI (2017), SDH/SAH s/p crani, HTN, GERD, HLD  Subjective: Pt seen in radiology for instrumental swallow evaluation Assessment / Plan / Recommendation CHL IP CLINICAL IMPRESSIONS 07/19/2019 Clinical Impression Pt presents with normal oropharyngeal swallow. No oral residue, no penetration or aspiration, and no post-swallow residue. Pt tolerated trials of thin liquid via cup and straw, puree, solid texture, and barium tablet with water without difficulty. Recommend continuing regular diet and thin liquids. Results and recommendations were reviewed with pt and her 2 daughters following this study. No further ST intervention recommended at this time.  Please reconsult if needs arise.  SLP Visit Diagnosis Dysphagia, oropharyngeal phase (R13.12)     Impact on safety and function Mild aspiration risk   CHL IP TREATMENT RECOMMENDATION 07/19/2019  Treatment Recommendations No treatment recommended at this time   Prognosis 07/19/2019 Prognosis for Safe Diet Advancement Good     CHL IP DIET RECOMMENDATION 07/19/2019 SLP Diet Recommendations Regular solids;Thin liquid  Liquid Administration via Cup;Straw Medication Administration Whole meds with liquid Compensations Slow rate;Small sips/bites Postural Changes Seated upright at 90 degrees   CHL IP OTHER RECOMMENDATIONS 07/19/2019   Oral Care Recommendations Oral care BID     CHL IP FOLLOW UP RECOMMENDATIONS 07/19/2019 Follow up Recommendations None      CHL IP ORAL PHASE 07/19/2019 Oral Phase WFL  CHL IP PHARYNGEAL PHASE 07/19/2019 Pharyngeal Phase WFL  CHL IP CERVICAL ESOPHAGEAL PHASE 07/19/2019 Cervical Esophageal Phase WFL Celia B. Quentin Ore, [facility], [place] Speech Language Pathologist Office: [phone] Pager: [phone] Shonna Chock 07/19/2019, 3:00 PM                Not all labs, radiology exams or other studies done during hospitalization come through on my EPIC note; however they are reviewed by me.    Assessment and Plan  Patient metabolic encephalopathy-improved, secondary to underlying infection failure to thrive and possible dehydration: The ammonia levels normal SNF-admitted for OT/PT  Aspiration pneumonia/UTI-patient.  We started on IV Rocephin, urine cultures less than 10,000 colonies on antibiotics for this patient on IV Unasyn for pneumonia transition to Augmentin SNF-continue Augmentin 875 twice daily for 3 more days along with DuoNeb every 6 as needed and incentive spirometry  Enlarging descending thoracic aortic aneurysm/ascending aortic aneurysm approximately 4.9 cm-increase in size of thoracic [place] from 4.6-4.7-5.1-5 0.1-5.4 cm SNF-follows up with Dr. Donnetta Hutching as an outpatient; plan is against any intervention at this time  Neuropathy right foot SNF-continue gabapentin 3 mg nightly  History of primary biliary cirrhosis SNF-diagnosis acute, LFTs are normal relatively  Hypertension SNF-stable; continue Norvasc 5 mg daily and losartan 100 mg daily  Hyperlipidemia SNF-continue fenofibrate 160 mg daily   Time spent greater than 45 minutes;> 50% of time with patient was spent  reviewing records, labs, tests and studies, counseling and developing plan of care  Hennie Duos, MD

## 2019-07-28 ENCOUNTER — Encounter: Payer: Self-pay | Admitting: Internal Medicine

## 2019-07-28 DIAGNOSIS — I7121 Aneurysm of the ascending aorta, without rupture: Secondary | ICD-10-CM | POA: Insufficient documentation

## 2019-07-28 DIAGNOSIS — J690 Pneumonitis due to inhalation of food and vomit: Secondary | ICD-10-CM | POA: Insufficient documentation

## 2019-07-28 DIAGNOSIS — G629 Polyneuropathy, unspecified: Secondary | ICD-10-CM | POA: Insufficient documentation

## 2019-07-28 DIAGNOSIS — I712 Thoracic aortic aneurysm, without rupture: Secondary | ICD-10-CM | POA: Insufficient documentation

## 2019-07-29 LAB — BASIC METABOLIC PANEL
BUN: 15 (ref 4–21)
CO2: 24 — AB (ref 13–22)
Chloride: 104 (ref 99–108)
Creatinine: 0.8 (ref 0.5–1.1)
Glucose: 77
Potassium: 3.7 (ref 3.4–5.3)
Sodium: 140 (ref 137–147)

## 2019-07-29 LAB — COMPREHENSIVE METABOLIC PANEL
Calcium: 9 (ref 8.7–10.7)
GFR calc Af Amer: 76.59
GFR calc non Af Amer: 66.08

## 2019-07-29 LAB — CBC AND DIFFERENTIAL
HCT: 35 — AB (ref 36–46)
Hemoglobin: 11.3 — AB (ref 12.0–16.0)
Platelets: 286 (ref 150–399)
WBC: 6.2

## 2019-07-29 LAB — CBC: RBC: 3.93 (ref 3.87–5.11)

## 2019-07-31 ENCOUNTER — Other Ambulatory Visit: Payer: Self-pay | Admitting: *Deleted

## 2019-07-31 NOTE — Patient Outreach (Signed)
Screened for potential [facility] Care Management needs as a benefit of  [employer].  Member is currently receiving skilled therapy at [facility].  Writer attended telephonic interdisciplinary team meeting to assess for disposition needs and transition plan for resident.   Facility reports member has sacral wound and is on po abx. She is mod/max with therapy.   Will continue to follow for transition plans on member while she resides in SNF.  Marthenia Rolling, MSN-Ed, RN,BSN [place] Acute Care Coordinator [phone] [facility]) [phone]  (Toll free office)

## 2019-08-15 ENCOUNTER — Other Ambulatory Visit: Payer: Self-pay | Admitting: *Deleted

## 2019-08-15 NOTE — Patient Outreach (Signed)
Member screened for potential [facility] Care Management needs as a benefit of [place] [employer].  Mrs. Comito transitioned to long term care at [facility].   No identifiable [facility] Care Management needs at this time.    Marthenia Rolling, MSN-Ed, RN,BSN [place] Acute Care Coordinator [phone] [facility]) [phone]  (Toll free office)

## 2019-09-05 ENCOUNTER — Non-Acute Institutional Stay (SKILLED_NURSING_FACILITY): Payer: Medicare Other | Admitting: Internal Medicine

## 2019-09-05 ENCOUNTER — Encounter: Payer: Self-pay | Admitting: Internal Medicine

## 2019-09-05 DIAGNOSIS — F0391 Unspecified dementia with behavioral disturbance: Secondary | ICD-10-CM

## 2019-09-05 DIAGNOSIS — I5032 Chronic diastolic (congestive) heart failure: Secondary | ICD-10-CM | POA: Diagnosis not present

## 2019-09-05 DIAGNOSIS — I10 Essential (primary) hypertension: Secondary | ICD-10-CM | POA: Diagnosis not present

## 2019-09-05 DIAGNOSIS — Z66 Do not resuscitate: Secondary | ICD-10-CM

## 2019-09-05 DIAGNOSIS — I712 Thoracic aortic aneurysm, without rupture: Secondary | ICD-10-CM

## 2019-09-05 DIAGNOSIS — K745 Biliary cirrhosis, unspecified: Secondary | ICD-10-CM

## 2019-09-05 DIAGNOSIS — I7123 Aneurysm of the descending thoracic aorta, without rupture: Secondary | ICD-10-CM

## 2019-09-05 NOTE — Progress Notes (Signed)
Location:  [facility] Room Number: 509-P Place of Service:  SNF [phone]) Provider:  [place] Lewis, P.A.  PCP: Ann Held, DO  Patient Care Team: Carollee Herter, Alferd Apa, DO as PCP - General Irine Seal, MD as Attending Physician (Urology)  Extended Emergency Contact Information Primary Emergency Contact: Lipke,Angela Address: [id number]          [place], [place] [address] Johnnette Litter of [place] Phone: [phone] Mobile Phone: [phone] Relation: Daughter Secondary Emergency Contact: Lipke,Bob Address: [address]          [place], [place] [address] Johnnette Litter of [place] Phone: [phone] Mobile Phone: [phone] Relation: Relative  Code Status:  DNR   Goals of care: Advanced Directive information Advanced Directives 09/05/2019  Does Patient Have a Medical Advance Directive? Yes  Type of Advance Directive Living will;Out of facility DNR (pink MOST or yellow form)  Does patient want to make changes to medical advance directive? No - Patient declined  Copy of [place] in Chart? -  Would patient like information on creating a medical advance directive? -  Pre-existing out of facility DNR order (yellow form or pink MOST form) Yellow form placed in chart (order not valid for inpatient use)     Chief complaint-routine visit for medical management of chronic medical conditions including history of traumatic brain injury with prior SDH SAH status post craniotomy-dementia-hypertension-recent history of possible aspiration pneumonia-also listed history of CHF  HPI:  Deanna [name] is a 86 y.o. female seen today for routine visit for medical management of chronic medical conditions as noted above.  She was admitted to skilled nursing after hospitalization for what was diagnosed as aspiration pneumonia with acute metabolic encephalopathy.  She initially was treated for UTI with Augmentin but because of increased  weakness and falls came back to the emergency department-CT of the head did not show any acute process but chest x-ray did show possible right-sided pneumonia.  CTA of the chest did show an enlarging descending thoracic aortic aneurysm.  She was hydrated and antibiotic was transitioned to IV Unasyn because of aspiration concerns.  Her UTI was asymptomatic and did not really require treatment.  She does have a listed history of CHF-but staff has not been able to obtain her weight secondary to patient refusal.  Per staff her edema appears to be at baseline and is more prominent when she has her legs in a dependent position.  Regards to hypertension she is on Cozaar 100 mg a day as well as Norvasc 5 mg a day blood pressures appear to be stable with systolics appears mainly in the 120s.  She also has a history of neuropathy and continues on Neurontin 200 mg at night.  In regards to an enlarging descending thoracic aortic aneurysm as well as an ascending aortic aneurysm-at some point it is thought she would benefit from follow-up with Dr. Donnetta Hutching at this point there were plans for no intervention.  Currently she is sitting in her chair comfortably-initially she did not allow any examination but with nursing support I was able to eventually examine her.  She does not appear to have any significant complaints nursing staff does not really report any concerns other than patient refusal especially when it comes to getting weights.   Past Medical History:  Diagnosis Date  . AAA (abdominal aortic aneurysm) [facility]) 2011   Dr Early repaired this  . Acute ischemic colitis ([place]) 03/06/2012  . Anemia  patient denies   . Arthritis    bilateral hands and knees  . Bleeding ulcer 1980s   H. Pylori  . Blood transfusion without reported diagnosis   . Cancer ([place])    hx of skin cancer on face   . Cataract    In the past  . Chronic urinary tract infection   . 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 Diastolic CHF ([place]) [id number]  . Epistaxis   . Fatigue   . GERD (gastroesophageal reflux disease)   . HLD (hyperlipidemia)   . HTN (hypertension)   . Hypertension   . Leg pain    bilateral  . OSA (obstructive sleep apnea)    mild on ss of 12/2008  . Pneumonia    hx of years ago   . Short-term memory loss    related to trauma of 08/2015   . Subdural hematoma ([place]) 08/2015  . Thoracic aortic aneurysm [facility])    Past Surgical History:  Procedure Laterality Date  . ABDOMINAL AORTIC ANEURYSM REPAIR  02/01/2010  . BREAST BIOPSY    . CATARACT EXTRACTION    . COLONOSCOPY  03/08/2012   Procedure: COLONOSCOPY;  Surgeon: Gatha Mayer, MD;  Location: [facility];  Service: Endoscopy;  Laterality: N/A;  . CRANIOTOMY     for evacuation of subdural hematoma - 08/29/15   . CYSTOSCOPY WITH HOLMIUM LASER LITHOTRIPSY Right 11/05/2015   Procedure: CYSTOSCOPY WITH HOLMIUM LASER LITHOTRIPSY;  Surgeon: Irine Seal, MD;  Location: [facility];  Service: Urology;  Laterality: Right;  . CYSTOSCOPY WITH URETEROSCOPY, STONE BASKETRY AND STENT PLACEMENT Right 11/05/2015   Procedure: CYSTOSCOPY WITH URETEROSCOPY, STONE BASKETRY AND STENT EXCHANGE;  Surgeon: Irine Seal, MD;  Location: [facility];  Service: Urology;  Laterality: Right;  . ERCP N/A 04/20/2013   Procedure: ENDOSCOPIC RETROGRADE CHOLANGIOPANCREATOGRAPHY (ERCP);  Surgeon: Ladene Artist, MD;  Location: [facility];  Service: Endoscopy;  Laterality: N/A;  . ILIAC ARTERY - FEMORAL ARTERY BYPASS GRAFT  2011   Dr Early    Allergies  Allergen Reactions  . Keppra [Levetiracetam] Shortness Of Breath  . Crestor [Rosuvastatin] Other (See Comments)    Muscle weakness  . Ezetimibe Other (See Comments)    Muscle weakness    Outpatient Encounter Medications as of 09/05/2019  Medication Sig  . amLODipine (NORVASC) 5 MG tablet TAKE 1 TABLET (5 MG TOTAL) BY MOUTH DAILY. CALL TO SCHEDULE APPOINTMENT  . docusate sodium (COLACE) 100 MG capsule Take 100 mg by mouth  daily.  . fenofibrate 160 MG tablet TAKE 1 TABLET BY MOUTH EVERY DAY  . fluticasone (FLONASE) 50 MCG/ACT nasal spray Place 2 sprays into both nostrils daily.  Marland Kitchen gabapentin (NEURONTIN) 100 MG capsule Take 2 capsules (200 mg total) by mouth at bedtime.  Marland Kitchen ipratropium-albuterol (DUONEB) 0.5-2.5 (3) MG/3ML SOLN Take 3 mLs by nebulization every 6 (six) hours as needed.  Marland Kitchen losartan (COZAAR) 100 MG tablet TAKE 1 TABLET DAILY  . nystatin cream (MYCOSTATIN) APPLY TO AFFECTED AREA TWICE A DAY  . OXYGEN Inhale 2 L into the lungs continuous.  . polyethylene glycol (MIRALAX / GLYCOLAX) 17 g packet Take 17 g by mouth daily as needed.  . senna-docusate (SENOKOT-S) 8.6-50 MG tablet Take 1 tablet by mouth at bedtime as needed for mild constipation.   No facility-administered encounter medications on file as of 09/05/2019.    Review of Systems   Again this was limited secondary to dementia and patient being agitated with exam-however nursing  does not report any acute concerns including any increased complaints of shortness of breath or chest pain or discomfort.  Main issue appears to be some agitation with attempt aggressive interventions or obtaining weights  Immunization History  Administered Date(s) Administered  . Hepatitis B 01/08/2010  . Influenza Whole 02/21/2007, 12/20/2007, 12/11/2009, 11/30/2010, 12/19/2012  . Influenza, High Dose Seasonal PF 12/14/2013, 12/31/2015, 02/08/2017, 12/22/2018  . Influenza-Unspecified 12/14/2013, 12/04/2014, 12/31/2015  . Pneumococcal Conjugate-13 06/25/2015  . Pneumococcal Polysaccharide-23 04/04/2004, 04/20/2005  . Td 04/04/2004   Pertinent  Health Maintenance Due  Topic Date Due  . INFLUENZA VACCINE  11/03/2019  . DEXA SCAN  Completed  . PNA vac Low Risk Adult  Completed   Fall Risk  11/05/2018 05/12/2017 12/31/2015 11/27/2014 05/10/2013  Falls in the past year? 0 Yes Yes No No  Comment [place]i Telephone Survey: data to providers prior to load - - - -  Number falls  in past yr: - 1 1 - -  Injury with Fall? - - Yes - -  Comment - - patient report having a fall due dog tripping her and she injuried her head - -  Risk for fall due to : - Impaired mobility - - -  Follow up - Falls prevention discussed;Follow up appointment - - -   Functional Status Survey:     Temperature 97.5 pulse 81 respirations 20 blood pressure 127/80 Physical Exam General this is a somewhat obese elderly female initially was agitated but with nursing support eventually did allow an examination-she does not appear to be in any distress.  Her skin is warm and dry she does have evidence of craniotomy scar right side of her scalp.  Eyes visual acuity appears to be intact sclera and conjunctive are clear.  Ears she appears to be somewhat hard of hearing.  Oropharynx appeared to be clear this was limited secondary to patient not opening her mouth very wide.  Chest was clear to auscultation with somewhat poor respiratory effort there was no labored breathing.  Heart was regular rate and rhythm without murmur gallop or rub she does have lower extremity edema-lymphedema I would say this was 1-2+.  Abdomen was obese soft nontender with positive bowel sounds.  Musculoskeletal Limited exam but she was in a wheelchair and appeared able to move all extremities x4 with some lower extremity weakness with edema.  Neurologic appears grossly intact her speech is clear cannot really appreciate lateralizing findings.  Psych she is oriented to self has been agitated but eventually did allow exam   Labs reviewed:  07/29/2019.  WBC 6.2 hemoglobin 11.3 platelets 286.  Sodium 140 potassium 3.7 BUN 14.6 creatinine 0.8   Recent Labs    07/23/19 0601 07/24/19 0557 07/25/19 0537  NA 137 138 138  K 4.2 4.5 4.2  CL 102 100 105  CO2 25 28 28   GLUCOSE 101* 110* 94  BUN 19 18 17   CREATININE 1.00 1.02* 0.99  CALCIUM 8.9 8.9 8.4*  MG  --   --  2.0   Recent Labs    07/22/19 0539  07/24/19 0557  AST 20 17  ALT 17 14  ALKPHOS 53 56  BILITOT 1.1 0.9  PROT 6.2* 6.4*  ALBUMIN 2.4* 2.5*   Recent Labs    07/18/19 1945 07/19/19 0516 07/23/19 0601 07/24/19 0557 07/25/19 0537  WBC 13.4*   < > 8.7 10.4 8.8  NEUTROABS 11.6*  --   --   --   --   HGB 11.2*   < >  11.4* 11.4* 10.5*  HCT 36.0   < > 37.7 37.2 34.1*  MCV 92.5   < > 94.3 94.2 92.9  PLT 266   < > 272 269 247   < > = values in this interval not displayed.   Lab Results  Component Value Date   TSH 3.97 09/26/2017   Lab Results  Component Value Date   HGBA1C 5.5 06/24/2011   Lab Results  Component Value Date   CHOL 165 09/26/2017   HDL 26.20 (L) 09/26/2017   LDLCALC 82 05/12/2017   LDLDIRECT 97.0 09/26/2017   TRIG 218.0 (H) 09/26/2017   CHOLHDL 6 09/26/2017    Significant Diagnostic Results in last 30 days:  No results found.  Assessment/Plan  #1 history of history of dementia-metabolic encephalopathy-this apparently was aggravated by her recent infection-she continues to have some agitation and appears this is more situational.  At this point continue supportive care.  Nursing also did speak with her today about trying to obtain an updated weight and they will attempt this apparently tomorrow.  2.  History of aspiration pneumonia-this was treated in the hospital she does not show evidence of increased cough congestion or fever at this point will monitor this apparently has stabilized.  She does have as needed duo nebs  3.  History of hypertension as noted above this appears stable on Norvasc 5 mg a day and Cozaar 100 mg a day.  4  History of enlarging descending thoracic aortic aneurysm-as well as ascending aortic aneurysm which is approximately 4.9 cm-.  Thoracic aneurysm up to 5.4 cm.  Suggestion for follow-up at some point with Dr. Donnetta Hutching at this point no aggressive intervention.  .  #5 history of neuropathy she is on Neurontin 200 mg nightly.  6.  History of biliary  cirrhosis-recent liver function tests were essentially within normal limits she does not show evidence of this at this point but will need to be monitored.  7.  History of CHF echo in 2015 showed ejection fraction of 60-65% with grade 1 diastolic dysfunction-she is not on a diuretic-again will see if we can get an updated weight to keep an eye on any weight gain-Per staff her edema appears to be relatively at baseline.  8.  History of constipation she is on Colace 100 mg daily she also has orders for senna as needed-apparently this has stabilized.   We will try to obtain lab work as well including a CBC and BMP so we can get updated values.  TA:[id number]

## 2019-09-06 ENCOUNTER — Encounter: Payer: Self-pay | Admitting: Internal Medicine

## 2019-09-07 LAB — COMPREHENSIVE METABOLIC PANEL
Albumin: 3.1 — AB (ref 3.5–5.0)
Calcium: 9.5 (ref 8.7–10.7)
GFR calc Af Amer: 59.88
GFR calc non Af Amer: 51.66
Globulin: 2.9

## 2019-09-07 LAB — BASIC METABOLIC PANEL
BUN: 22 — AB (ref 4–21)
CO2: 24 — AB (ref 13–22)
Chloride: 106 (ref 99–108)
Creatinine: 1 (ref 0.5–1.1)
Glucose: 78
Potassium: 4 (ref 3.4–5.3)
Sodium: 142 (ref 137–147)

## 2019-09-07 LAB — HEPATIC FUNCTION PANEL
ALT: 13 (ref 7–35)
AST: 20 (ref 13–35)
Alkaline Phosphatase: 83 (ref 25–125)
Bilirubin, Total: 0.7

## 2019-09-11 ENCOUNTER — Other Ambulatory Visit: Payer: Self-pay | Admitting: Family Medicine

## 2019-09-11 DIAGNOSIS — I10 Essential (primary) hypertension: Secondary | ICD-10-CM

## 2019-09-25 ENCOUNTER — Non-Acute Institutional Stay (SKILLED_NURSING_FACILITY): Payer: Medicare Other | Admitting: Internal Medicine

## 2019-09-25 ENCOUNTER — Encounter: Payer: Self-pay | Admitting: Internal Medicine

## 2019-09-25 DIAGNOSIS — I5032 Chronic diastolic (congestive) heart failure: Secondary | ICD-10-CM | POA: Diagnosis not present

## 2019-09-25 DIAGNOSIS — I10 Essential (primary) hypertension: Secondary | ICD-10-CM

## 2019-09-25 DIAGNOSIS — N289 Disorder of kidney and ureter, unspecified: Secondary | ICD-10-CM | POA: Diagnosis not present

## 2019-09-25 NOTE — Progress Notes (Signed)
Location:    [facility] Room Number: 417/D Place of Service:  SNF [phone]) Provider:  Philip Aspen, DO  Patient Care Team: Ann Held, DO as PCP - General Irine Seal, MD as Attending Physician (Urology)  Extended Emergency Contact Information Primary Emergency Contact: Lipke,Angela Address: [address]          [place], [place] [address] Johnnette Litter of [place] Phone: [phone] Mobile Phone: [phone] Relation: Daughter Secondary Emergency Contact: Lipke,Bob Address: [address]          [place], [place] [address] Johnnette Litter of [place] Phone: [phone] Mobile Phone: [phone] Relation: Relative  Code Status:  DNR Goals of care: Advanced Directive information Advanced Directives 09/25/2019  Does Patient Have a Medical Advance Directive? Yes  Type of Paramedic of [place];Living will;Out of facility DNR (pink MOST or yellow form)  Does patient want to make changes to medical advance directive? No - Patient declined  Copy of [place] in Chart? Yes - validated most recent copy scanned in chart (See row information)  Would patient like information on creating a medical advance directive? -  Pre-existing out of facility DNR order (yellow form or pink MOST form) Yellow form placed in chart (order not valid for inpatient use)     Chief Complaint  Patient presents with   Follow-up    Renal insufficency    HPI:  Pt is a 86 y.o. female seen today for an acute visit for to assess for possible renal insufficiency.   Patient was hospitalized for aspiration pneumonia with acute metabolic encephalopathy.  She was treated for a UTI with Augmentin him initially but because of increased weakness and falls came back to the ER and CT of the head did not show any acute process but chest x-ray did show possible right-sided pneumonia.  She was hydrated and  antibiotic was transitioned to IV Unasyn because of aspiration concerns.  UTI was asymptomatic and did not really require treatment.  So has a history of dementia as well as hypertension-CHF-neuropathy-and history of an enlarging descending thoracic aortic aneurysm as well as an ascending aortic aneurysm-it is thought she would benefit from follow-up with Dr. Donnetta Hutching at some point-currently no plans for acute intervention.  Her stay here has been fairly unremarkable per staff but her daughter is concerned about possibly dehydration concern since she continues to be at times agitated-her daughter states when she is agitated she tends to be getting dehydrated.  Nursing staff does not report any acute behavior changes at times she has been agitated with staff and at times has been agitated when I examined her--but according to staff this has not significantly increased lately  Staff feels she is  eating and drinking fairly adequately-  Today she did let me examine her when I was accompanied by a nurse but had to be coaxed somewhat  Past Medical History:  Diagnosis Date   AAA (abdominal aortic aneurysm) ([place]) 2011   Dr Early repaired this   Acute ischemic colitis ([place]) 03/06/2012   Anemia    patient denies    Arthritis    bilateral hands and knees   Bleeding ulcer 1980s   H. Pylori   Blood transfusion without reported diagnosis    Cancer ([place])    hx of skin cancer on face    Cataract    In the past   Chronic urinary tract infection  Cirrhosis, biliary ([place])    liver bx 87/5643   Diastolic CHF ([place]) 07/01/5186   Epistaxis    Fatigue    GERD (gastroesophageal reflux disease)    HLD (hyperlipidemia)    HTN (hypertension)    Hypertension    Leg pain    bilateral   OSA (obstructive sleep apnea)    mild on ss of 12/2008   Pneumonia    hx of years ago    Short-term memory loss    related to trauma of 08/2015    Subdural hematoma ([place]) 08/2015   Thoracic aortic  aneurysm [facility])    Past Surgical History:  Procedure Laterality Date   ABDOMINAL AORTIC ANEURYSM REPAIR  02/01/2010   BREAST BIOPSY     CATARACT EXTRACTION     COLONOSCOPY  03/08/2012   Procedure: COLONOSCOPY;  Surgeon: Gatha Mayer, MD;  Location: [facility];  Service: Endoscopy;  Laterality: N/A;   CRANIOTOMY     for evacuation of subdural hematoma - 08/29/15    CYSTOSCOPY WITH HOLMIUM LASER LITHOTRIPSY Right 11/05/2015   Procedure: CYSTOSCOPY WITH HOLMIUM LASER LITHOTRIPSY;  Surgeon: Irine Seal, MD;  Location: [facility];  Service: Urology;  Laterality: Right;   CYSTOSCOPY WITH URETEROSCOPY, STONE BASKETRY AND STENT PLACEMENT Right 11/05/2015   Procedure: CYSTOSCOPY WITH URETEROSCOPY, STONE BASKETRY AND STENT EXCHANGE;  Surgeon: Irine Seal, MD;  Location: [facility];  Service: Urology;  Laterality: Right;   ERCP N/A 04/20/2013   Procedure: ENDOSCOPIC RETROGRADE CHOLANGIOPANCREATOGRAPHY (ERCP);  Surgeon: Ladene Artist, MD;  Location: [facility];  Service: Endoscopy;  Laterality: N/A;   ILIAC ARTERY - FEMORAL ARTERY BYPASS GRAFT  2011   Dr Early    Allergies  Allergen Reactions   Keppra [Levetiracetam] Shortness Of Breath   Crestor [Rosuvastatin] Other (See Comments)    Muscle weakness   Ezetimibe Other (See Comments)    Muscle weakness    Outpatient Encounter Medications as of 09/25/2019  Medication Sig   Amino Acids-Protein Hydrolys (FEEDING SUPPLEMENT, PRO-STAT SUGAR FREE 64,) LIQD Take 30 mLs by mouth 2 (two) times daily. For wound healing   amLODipine (NORVASC) 5 MG tablet TAKE 1 TABLET (5 MG TOTAL) BY MOUTH DAILY. CALL TO SCHEDULE APPOINTMENT   docusate sodium (COLACE) 100 MG capsule Take 100 mg by mouth daily.   fenofibrate 160 MG tablet TAKE 1 TABLET BY MOUTH EVERY DAY   fluticasone (FLONASE) 50 MCG/ACT nasal spray Place 2 sprays into both nostrils daily.   gabapentin (NEURONTIN) 100 MG capsule Take 2 capsules (200 mg total) by mouth at bedtime.    ipratropium-albuterol (DUONEB) 0.5-2.5 (3) MG/3ML SOLN Take 3 mLs by nebulization every 6 (six) hours as needed.   losartan (COZAAR) 100 MG tablet TAKE 1 TABLET DAILY   NON FORMULARY Diet Order: Reg textures/thin liquids. Continue all other dietary recommendations per RD.   NON FORMULARY Magic Cup with L/D meal d/t decreased intake, risk of malnutrition   OXYGEN Inhale 2 L into the lungs continuous.   polyethylene glycol (MIRALAX / GLYCOLAX) 17 g packet Take 17 g by mouth daily as needed.   senna-docusate (SENOKOT-S) 8.6-50 MG tablet Take 1 tablet by mouth at bedtime as needed for mild constipation.   [DISCONTINUED] nystatin cream (MYCOSTATIN) APPLY TO AFFECTED AREA TWICE A DAY   No facility-administered encounter medications on file as of 09/25/2019.    Review of Systems   This is largely unobtainable secondary to dementia.    Immunization History  Administered Date(s) Administered   Hepatitis B  01/08/2010   Influenza Whole 02/21/2007, 12/20/2007, 12/11/2009, 11/30/2010, 12/19/2012   Influenza, High Dose Seasonal PF 12/14/2013, 12/31/2015, 02/08/2017, 12/22/2018   Influenza-Unspecified 12/14/2013, 12/04/2014, 12/31/2015   Pneumococcal Conjugate-13 06/25/2015   Pneumococcal Polysaccharide-23 04/04/2004, 04/20/2005   Td 04/04/2004   Pertinent  Health Maintenance Due  Topic Date Due   INFLUENZA VACCINE  11/03/2019   DEXA SCAN  Completed   PNA vac Low Risk Adult  Completed   Fall Risk  11/05/2018 05/12/2017 12/31/2015 11/27/2014 05/10/2013  Falls in the past year? 0 Yes Yes No No  Comment Emmi Telephone Survey: data to providers prior to load - - - -  Number falls in past yr: - 1 1 - -  Injury with Fall? - - Yes - -  Comment - - patient report having a fall due dog tripping her and she injuried her head - -  Risk for fall due to : - Impaired mobility - - -  Follow up - Falls prevention discussed;Follow up appointment - - -   Functional Status Survey:    Vitals:    09/25/19 1526  BP: 119/73  Pulse: 69  Resp: 20  Temp: 97.6 F (36.4 C)  TempSrc: Oral  SpO2: 96%  Weight: 225 lb 3.2 oz (102.2 kg)  Height: 5\' 9"  (1.753 m)   Body mass index is 33.26 kg/m. Physical Exam In general this is a somewhat obese elderly female in no distress-is somewhat agitated with exam but did allow it when assisted by nurse.  This is comparable to previous presentation when I examined her.  Her skin is warm and dry she does have a craniotomy scar right side of her scalp.  Eyes visual acuity appears to be intact sclera and conjunctive are clear.  Oropharynx clear mucous membranes appear fairly moist.  Chest is clear to auscultation with somewhat poor respiratory effort there is no labored breathing.  Heart is regular rate and rhythm with somewhat distant heart sounds she has baseline lymphedema-edema I would say 2+.  Abdomen is obese soft nontender with positive bowel sounds.  Musculoskeletal appears able to move all extremities x4 at relative baseline-she continues with significant lower extremity edema but this appears to be baseline.  Neurologic appears grossly intact her speech is clear cannot really appreciate lateralizing findings.  Psych she is oriented to self does generally follow verbal commands with prompting and assistance by the nurse-continues to have some agitation-per staff this is relatively baseline Labs reviewed:  September 07, 2019.  Sodium 142 potassium 4 BUN 22 creatinine 0.98.   Recent Labs    07/23/19 0601 07/24/19 0557 07/25/19 0537  NA 137 138 138  K 4.2 4.5 4.2  CL 102 100 105  CO2 25 28 28   GLUCOSE 101* 110* 94  BUN 19 18 17   CREATININE 1.00 1.02* 0.99  CALCIUM 8.9 8.9 8.4*  MG  --   --  2.0   Recent Labs    07/22/19 0539 07/24/19 0557  AST 20 17  ALT 17 14  ALKPHOS 53 56  BILITOT 1.1 0.9  PROT 6.2* 6.4*  ALBUMIN 2.4* 2.5*   Recent Labs    07/18/19 1945 07/19/19 0516 07/23/19 0601 07/24/19 0557 07/25/19 0537   WBC 13.4*   < > 8.7 10.4 8.8  NEUTROABS 11.6*  --   --   --   --   HGB 11.2*   < > 11.4* 11.4* 10.5*  HCT 36.0   < > 37.7 37.2 34.1*  MCV 92.5   < >  94.3 94.2 92.9  PLT 266   < > 272 269 247   < > = values in this interval not displayed.   Lab Results  Component Value Date   TSH 3.97 09/26/2017   Lab Results  Component Value Date   HGBA1C 5.5 06/24/2011   Lab Results  Component Value Date   CHOL 165 09/26/2017   HDL 26.20 (L) 09/26/2017   LDLCALC 82 05/12/2017   LDLDIRECT 97.0 09/26/2017   TRIG 218.0 (H) 09/26/2017   CHOLHDL 6 09/26/2017    Significant Diagnostic Results in last 30 days:  No results found.  Assessment/Plan   #1-no insufficiency?-Her daughter feels that possibly she may be coming somewhat dry will order a lab to follow-up on this-staff continues to encourage fluids.  Per staff her behavior appears relatively baseline-but this will have to be monitored as well.  Will await lab results.  I did discuss this with her daughter via phone and told her the plan to draw labs and she expressed understanding.  2 CHF-this appears.    to be stable with weight of 225.2 pounds which has been stable since the beginning of the month it appears-at this point will monitor she does not really exhibit any shortness of breath or increasing edema from baseline-  . #3 history of hypertension-this appears stable she continues on Norvasc 5 mg a day as well as Cozaar 100 mg daily.  [id number]

## 2019-09-26 LAB — CBC AND DIFFERENTIAL
HCT: 35 — AB (ref 36–46)
Hemoglobin: 11.2 — AB (ref 12.0–16.0)
Platelets: 246 (ref 150–399)
WBC: 5.7

## 2019-09-26 LAB — COMPREHENSIVE METABOLIC PANEL
Calcium: 9.3 (ref 8.7–10.7)
GFR calc Af Amer: 64.61
GFR calc non Af Amer: 55.75

## 2019-09-26 LAB — BASIC METABOLIC PANEL
BUN: 25 — AB (ref 4–21)
CO2: 23 — AB (ref 13–22)
Chloride: 105 (ref 99–108)
Creatinine: 0.9 (ref 0.5–1.1)
Glucose: 71
Potassium: 4.3 (ref 3.4–5.3)
Sodium: 139 (ref 137–147)

## 2019-09-26 LAB — CBC: RBC: 3.76 — AB (ref 3.87–5.11)

## 2019-09-28 ENCOUNTER — Encounter: Payer: Self-pay | Admitting: Internal Medicine

## 2019-10-20 ENCOUNTER — Other Ambulatory Visit: Payer: Self-pay | Admitting: Family Medicine

## 2019-10-20 DIAGNOSIS — I10 Essential (primary) hypertension: Secondary | ICD-10-CM

## 2019-11-08 ENCOUNTER — Telehealth: Payer: Self-pay | Admitting: Family Medicine

## 2019-11-08 DIAGNOSIS — I10 Essential (primary) hypertension: Secondary | ICD-10-CM

## 2019-11-08 MED ORDER — AMLODIPINE BESYLATE 5 MG PO TABS: 5.0000 mg | ORAL_TABLET | Freq: Every day | ORAL | 0 refills | Status: AC

## 2019-11-08 NOTE — Telephone Encounter (Signed)
Refill sent.

## 2019-11-08 NOTE — Telephone Encounter (Signed)
Medication: amLODipine (NORVASC) 5 MG tablet [[id number]]    Has the patient contacted their pharmacy? No. (If no, request that the patient contact the pharmacy for the refill.) (If yes, when and what did the pharmacy advise?)  Preferred Pharmacy (with phone number or street name):  [facility] [place], [place] to [address] [place] [address] Phone:  [phone]  Fax:  [phone]       Agent: Please be advised that RX refills may take up to 3 business days. We ask that you follow-up with your pharmacy.

## 2019-11-14 ENCOUNTER — Non-Acute Institutional Stay (SKILLED_NURSING_FACILITY): Payer: Medicare Other | Admitting: Family

## 2019-11-14 ENCOUNTER — Encounter: Payer: Self-pay | Admitting: Family

## 2019-11-14 DIAGNOSIS — I10 Essential (primary) hypertension: Secondary | ICD-10-CM

## 2019-11-14 DIAGNOSIS — F0391 Unspecified dementia with behavioral disturbance: Secondary | ICD-10-CM

## 2019-11-14 DIAGNOSIS — I5032 Chronic diastolic (congestive) heart failure: Secondary | ICD-10-CM | POA: Diagnosis not present

## 2019-11-14 DIAGNOSIS — E785 Hyperlipidemia, unspecified: Secondary | ICD-10-CM | POA: Diagnosis not present

## 2019-11-14 NOTE — Progress Notes (Signed)
Location:    Accident.   Nursing Home Room Number: 417-D Place of Service:  SNF (31) Provider:  Marlowe Sax, NP    Patient Care Team: Carollee Herter, Alferd Apa, DO as PCP - General Irine Seal, MD as Attending Physician (Urology)  Extended Emergency Contact Information Primary Emergency Contact: Lipke,Angela Address: [address]          [place], [place] [address] Johnnette Litter of [place] Phone: [phone] Mobile Phone: [phone] Relation: Daughter Secondary Emergency Contact: Lipke,Bob Address: [address] [place]          [place], [place] [address] Johnnette Litter of [place] Phone: [phone] Mobile Phone: [phone] Relation: Relative  Code Status:  DNR Goals of care: Advanced Directive information Advanced Directives 11/14/2019  Does Patient Have a Medical Advance Directive? Yes  Type of Paramedic of [place];Living will;Out of facility DNR (pink MOST or yellow form)  Does patient want to make changes to medical advance directive? No - Patient declined  Copy of [place] in Chart? Yes - validated most recent copy scanned in chart (See row information)  Would patient like information on creating a medical advance directive? -  Pre-existing out of facility DNR order (yellow form or pink MOST form) Yellow form placed in chart (order not valid for inpatient use)     Chief Complaint  Patient presents with  . Medical Management of Chronic Issues    Routine Visit.  . Immunizations    Discuss the need for Tetanus Vaccine, Influenza Vaccine, and Covid Vaccine.    HPI:  Pt is a 86 y.o. female seen today for medical management of chronic diseases.she is seen in her room today in bed .she denies any acute issues.she has a medical history of Hypertension,Hyperlipidemia,GERD,SDH SAH status post craniotomy,CHF,dementia, AAA among other condition.Facility Nurse reports patient sustained unwitnessed fall episode  was observed on the floor next to her bed.she could not remember what happed.No injuries sustained.she answers no to most question during visit.Nurse states patient refuses to be weighed. Her blood pressure readings ranging in the 100's/60's- 140's/60's.she denies any headache dizziness,lightheadedness,palpitation or chest pain.    Past Medical History:  Diagnosis Date  . AAA (abdominal aortic aneurysm) [facility] 2011   Dr Early repaired this  . Acute ischemic colitis ([place]) 03/06/2012  . Anemia    patient denies   . Arthritis    bilateral hands and knees  . Bleeding ulcer 1980s   H. Pylori  . Blood transfusion without reported diagnosis   . Cancer ([place])    hx of skin cancer on face   . Cataract    In the past  . Chronic urinary tract infection   . Cirrhosis, biliary ([place])    liver bx 01/2008  . Diastolic CHF ([place]) 11/09/8108  . Epistaxis   . Fatigue   . GERD (gastroesophageal reflux disease)   . HLD (hyperlipidemia)   . HTN (hypertension)   . Hypertension   . Leg pain    bilateral  . OSA (obstructive sleep apnea)    mild on ss of 12/2008  . Pneumonia    hx of years ago   . Short-term memory loss    related to trauma of 08/2015   . Subdural hematoma ([place]) 08/2015  . Thoracic aortic aneurysm [facility])    Past Surgical History:  Procedure Laterality Date  . ABDOMINAL AORTIC ANEURYSM REPAIR  02/01/2010  . BREAST BIOPSY    . CATARACT EXTRACTION    .  COLONOSCOPY  03/08/2012   Procedure: COLONOSCOPY;  Surgeon: Gatha Mayer, MD;  Location: [facility];  Service: Endoscopy;  Laterality: N/A;  . CRANIOTOMY     for evacuation of subdural hematoma - 08/29/15   . CYSTOSCOPY WITH HOLMIUM LASER LITHOTRIPSY Right 11/05/2015   Procedure: CYSTOSCOPY WITH HOLMIUM LASER LITHOTRIPSY;  Surgeon: Irine Seal, MD;  Location: [facility];  Service: Urology;  Laterality: Right;  . CYSTOSCOPY WITH URETEROSCOPY, STONE BASKETRY AND STENT PLACEMENT Right 11/05/2015   Procedure: CYSTOSCOPY WITH URETEROSCOPY, STONE  BASKETRY AND STENT EXCHANGE;  Surgeon: Irine Seal, MD;  Location: [facility];  Service: Urology;  Laterality: Right;  . ERCP N/A 04/20/2013   Procedure: ENDOSCOPIC RETROGRADE CHOLANGIOPANCREATOGRAPHY (ERCP);  Surgeon: Ladene Artist, MD;  Location: [facility];  Service: Endoscopy;  Laterality: N/A;  . ILIAC ARTERY - FEMORAL ARTERY BYPASS GRAFT  2011   Dr Early    Allergies  Allergen Reactions  . Keppra [Levetiracetam] Shortness Of Breath  . Crestor [Rosuvastatin] Other (See Comments)    Muscle weakness  . Ezetimibe Other (See Comments)    Muscle weakness    Allergies as of 11/14/2019      Reactions   Keppra [levetiracetam] Shortness Of Breath   Crestor [rosuvastatin] Other (See Comments)   Muscle weakness   Ezetimibe Other (See Comments)   Muscle weakness      Medication List       Accurate as of November 14, 2019  2:16 PM. If you have any questions, ask your nurse or doctor.        STOP taking these medications   feeding supplement (PRO-STAT SUGAR FREE 64) Liqd Stopped by: Sandrea Hughs, NP     TAKE these medications   amLODipine 5 MG tablet Commonly known as: NORVASC Take 1 tablet (5 mg total) by mouth daily. Call to schedule appointment   docusate sodium 100 MG capsule Commonly known as: COLACE Take 100 mg by mouth daily.   fenofibrate 160 MG tablet TAKE 1 TABLET BY MOUTH EVERY DAY   fluticasone 50 MCG/ACT nasal spray Commonly known as: FLONASE Place 2 sprays into both nostrils daily.   gabapentin 100 MG capsule Commonly known as: NEURONTIN Take 2 capsules (200 mg total) by mouth at bedtime.   ipratropium-albuterol 0.5-2.5 (3) MG/3ML Soln Commonly known as: DUONEB Take 3 mLs by nebulization every 6 (six) hours as needed.   losartan 100 MG tablet Commonly known as: COZAAR TAKE 1 TABLET DAILY   NON FORMULARY Diet Order: Reg textures/thin liquids. Continue all other dietary recommendations per RD.   NON FORMULARY Magic Cup with L/D meal d/t decreased  intake, risk of malnutrition   OXYGEN Inhale 2 L into the lungs continuous.   polyethylene glycol 17 g packet Commonly known as: MIRALAX / GLYCOLAX Take 17 g by mouth daily as needed.   senna-docusate 8.6-50 MG tablet Commonly known as: Senokot-S Take 1 tablet by mouth at bedtime as needed for mild constipation.       Review of Systems  Constitutional: Negative for appetite change, chills, fatigue, fever and unexpected weight change.  HENT: Negative for congestion, rhinorrhea, sinus pressure, sinus pain, sneezing and sore throat.   Eyes: Negative for discharge, redness and itching.  Respiratory: Negative for cough, chest tightness, shortness of breath and wheezing.   Cardiovascular: Negative for chest pain, palpitations and leg swelling.  Gastrointestinal: Negative for abdominal distention, abdominal pain, constipation, diarrhea, nausea and vomiting.  Endocrine: Negative for cold intolerance, heat intolerance, polydipsia, polyphagia and polyuria.  Genitourinary: Negative for difficulty urinating, dysuria, flank pain and urgency.       Incontinent   Musculoskeletal: Positive for arthralgias and gait problem. Negative for joint swelling, myalgias and neck pain.  Skin: Negative for color change, pallor and rash.  Neurological: Positive for weakness and numbness. Negative for dizziness, seizures, speech difficulty, light-headedness and headaches.  Hematological: Does not bruise/bleed easily.  Psychiatric/Behavioral: Negative for agitation, behavioral problems, confusion and sleep disturbance. The patient is not nervous/anxious.     Immunization History  Administered Date(s) Administered  . Hepatitis B 01/08/2010  . Influenza Whole 02/21/2007, 12/20/2007, 12/11/2009, 11/30/2010, 12/19/2012  . Influenza, High Dose Seasonal PF 12/14/2013, 12/31/2015, 02/08/2017, 12/22/2018  . Influenza-Unspecified 12/14/2013, 12/04/2014, 12/31/2015  . Pneumococcal Conjugate-13 06/25/2015  .  Pneumococcal Polysaccharide-23 04/04/2004, 04/20/2005  . Td 04/04/2004  . Unspecified SARS-COV-2 Vaccination 08/27/2019, 10/08/2019   Pertinent  Health Maintenance Due  Topic Date Due  . INFLUENZA VACCINE  11/03/2019  . DEXA SCAN  Completed  . PNA vac Low Risk Adult  Completed   Fall Risk  11/05/2018 05/12/2017 12/31/2015 11/27/2014 05/10/2013  Falls in the past year? 0 Yes Yes No No  Comment Emmi Telephone Survey: data to providers prior to load - - - -  Number falls in past yr: - 1 1 - -  Injury with Fall? - - Yes - -  Comment - - patient report having a fall due dog tripping her and she injuried her head - -  Risk for fall due to : - Impaired mobility - - -  Follow up - Falls prevention discussed;Follow up appointment - - -    Vitals:   11/14/19 1407  BP: 127/68  Pulse: 77  Resp: 18  Temp: (!) 96.8 F (36 C)  SpO2: 96%  Weight: 225 lb (102.1 kg)  Height: 5\' 9"  (1.753 m)   Body mass index is 33.23 kg/m. Physical Exam Constitutional:      General: She is not in acute distress.    Appearance: She is not ill-appearing.  HENT:     Head: Normocephalic.     Nose: Nose normal. No congestion or rhinorrhea.     Mouth/Throat:     Mouth: Mucous membranes are moist.     Pharynx: Oropharynx is clear. No oropharyngeal exudate or posterior oropharyngeal erythema.  Eyes:     General: No scleral icterus.       Right eye: No discharge.        Left eye: No discharge.     Conjunctiva/sclera: Conjunctivae normal.     Pupils: Pupils are equal, round, and reactive to light.  Cardiovascular:     Rate and Rhythm: Normal rate and regular rhythm.     Pulses: Normal pulses.     Heart sounds: Normal heart sounds. No murmur heard.  No friction rub. No gallop.   Pulmonary:     Effort: Pulmonary effort is normal. No respiratory distress.     Breath sounds: Normal breath sounds. No wheezing, rhonchi or rales.     Comments: Oxygen via nasal cannula in place  Chest:     Chest wall: No tenderness.    Abdominal:     General: Bowel sounds are normal. There is no distension.     Palpations: Abdomen is soft. There is no mass.     Tenderness: There is no abdominal tenderness. There is no right CVA tenderness, left CVA tenderness, guarding or rebound.  Musculoskeletal:        General: No swelling or  tenderness.     Cervical back: Normal range of motion. No rigidity or tenderness.     Comments: In bed during visit moves x 4 extremities.bilateral lower extremities 1+ edema   Lymphadenopathy:     Cervical: No cervical adenopathy.  Skin:    General: Skin is warm.     Coloration: Skin is not pale.     Findings: No bruising, erythema or rash.     Comments: Right side head old surgical incision   Neurological:     Mental Status: She is alert.     Cranial Nerves: No cranial nerve deficit.     Coordination: Coordination normal.     Gait: Gait abnormal.     Comments: Alert and oriented to self   Psychiatric:        Mood and Affect: Mood normal.        Speech: Speech normal.        Behavior: Behavior normal.        Thought Content: Thought content normal.        Judgment: Judgment normal.    Labs reviewed: Recent Labs    07/23/19 0601 07/23/19 0601 07/24/19 0557 07/24/19 0557 07/25/19 0537 07/25/19 0537 07/29/19 0000 09/07/19 0000 09/26/19 0000  NA 137   < > 138   < > 138  --  140 142 139  K 4.2   < > 4.5   < > 4.2   < > 3.7 4.0 4.3  CL 102   < > 100   < > 105   < > 104 106 105  CO2 25   < > 28   < > 28   < > 24* 24* 23*  GLUCOSE 101*  --  110*  --  94  --   --   --   --   BUN 19   < > 18   < > 17  --  15 22* 25*  CREATININE 1.00   < > 1.02*   < > 0.99  --  0.8 1.0 0.9  CALCIUM 8.9   < > 8.9   < > 8.4*   < > 9.0 9.5 9.3  MG  --   --   --   --  2.0  --   --   --   --    < > = values in this interval not displayed.   Recent Labs    07/22/19 0539 07/24/19 0557 09/07/19 0000  AST 20 17 20   ALT 17 14 13   ALKPHOS 53 56 83  BILITOT 1.1 0.9  --   PROT 6.2* 6.4*  --    ALBUMIN 2.4* 2.5* 3.1*   Recent Labs    07/18/19 1945 07/19/19 0516 07/23/19 0601 07/23/19 0601 07/24/19 0557 07/24/19 0557 07/25/19 0537 07/29/19 0000 09/26/19 0000  WBC 13.4*   < > 8.7   < > 10.4   < > 8.8 6.2 5.7  NEUTROABS 11.6*  --   --   --   --   --   --   --   --   HGB 11.2*   < > 11.4*   < > 11.4*   < > 10.5* 11.3* 11.2*  HCT 36.0   < > 37.7   < > 37.2   < > 34.1* 35* 35*  MCV 92.5   < > 94.3  --  94.2  --  92.9  --   --   PLT 266   < >  272   < > 269   < > 247 286 246   < > = values in this interval not displayed.   Lab Results  Component Value Date   TSH 3.97 09/26/2017   Lab Results  Component Value Date   HGBA1C 5.5 06/24/2011   Lab Results  Component Value Date   CHOL 165 09/26/2017   HDL 26.20 (L) 09/26/2017   LDLCALC 82 05/12/2017   LDLDIRECT 97.0 09/26/2017   TRIG 218.0 (H) 09/26/2017   CHOLHDL 6 09/26/2017    Significant Diagnostic Results in last 30 days:  No results found.  Assessment/Plan 1. Essential hypertension B/p at goal.continue on amlodipine and losartan   2. Chronic diastolic congestive heart failure (HCC) Refuses to be weighed.No signs of fluid overload.  3. Hyperlipidemia, unspecified hyperlipidemia type No latest lipid panel.continue on fenofibrate   4. Dementia with behavioral disturbance, unspecified dementia type ([place]) No new behavioral issues.continue with supportive care.   Family/ staff Communication: Reviewed plan with patient and facility Nurse.   Labs/tests ordered: none

## 2019-12-27 LAB — COMPREHENSIVE METABOLIC PANEL
Calcium: 9.6 (ref 8.7–10.7)
GFR calc Af Amer: 32.06
GFR calc non Af Amer: 27.66

## 2019-12-27 LAB — CBC AND DIFFERENTIAL
HCT: 38 (ref 36–46)
Hemoglobin: 12 (ref 12.0–16.0)
Platelets: 208 (ref 150–399)
WBC: 8.9

## 2019-12-27 LAB — BASIC METABOLIC PANEL
BUN: 25 — AB (ref 4–21)
CO2: 25 — AB (ref 13–22)
Chloride: 105 (ref 99–108)
Creatinine: 1.6 — AB (ref 0.5–1.1)
Glucose: 91
Potassium: 4.3 (ref 3.4–5.3)
Sodium: 141 (ref 137–147)

## 2019-12-27 LAB — CBC: RBC: 4.08 (ref 3.87–5.11)

## 2019-12-30 ENCOUNTER — Encounter: Payer: Self-pay | Admitting: Family

## 2019-12-30 ENCOUNTER — Non-Acute Institutional Stay (SKILLED_NURSING_FACILITY): Payer: Medicare Other | Admitting: Family

## 2019-12-30 DIAGNOSIS — N289 Disorder of kidney and ureter, unspecified: Secondary | ICD-10-CM

## 2019-12-30 NOTE — Progress Notes (Signed)
Location:    [facility] Room Number: 417-D Place of Service:  SNF (31) Provider:  Marlowe Sax, NP    Patient Care Team: Carollee Herter, Alferd Apa, DO as PCP - General Irine Seal, MD as Attending Physician (Urology)  Extended Emergency Contact Information Primary Emergency Contact: Lipke,Angela Address: [address]          [place], [place] [address] Johnnette Litter of [place] Phone: [phone] Mobile Phone: [phone] Relation: Daughter Secondary Emergency Contact: Lipke,Bob Address: [address] [place]          [place], [place] [address] Johnnette Litter of [place] Phone: [phone] Mobile Phone: [phone] Relation: Relative  Code Status:  DNR Goals of care: Advanced Directive information Advanced Directives 12/30/2019  Does Patient Have a Medical Advance Directive? Yes  Type of Paramedic of [place];Living will;Out of facility DNR (pink MOST or yellow form)  Does patient want to make changes to medical advance directive? No - Patient declined  Copy of [place] in Chart? Yes - validated most recent copy scanned in chart (See row information)  Would patient like information on creating a medical advance directive? -  Pre-existing out of facility DNR order (yellow form or pink MOST form) Yellow form placed in chart (order not valid for inpatient use)     Chief Complaint  Patient presents with  . Acute Visit    Abnormal Labs.    HPI:  Pt is a 86 y.o. female seen today for an acute visit for evaluation of abnormal lab results.she is seen in her room sitting on wheelchair beside her bed.she denies any acute issues.Her labs received CR 1.64,BUN 25.4 previous CR 1.00 and BUN 22.she is not on any diuretics or NSAID's.   Past Medical History:  Diagnosis Date  . AAA (abdominal aortic aneurysm) [facility]) 2011   Dr Early repaired this  . Acute ischemic colitis ([place]) 03/06/2012  . Anemia    patient  denies   . Arthritis    bilateral hands and knees  . Bleeding ulcer 1980s   H. Pylori  . Blood transfusion without reported diagnosis   . Cancer ([place])    hx of skin cancer on face   . Cataract    In the past  . Chronic urinary tract infection   . Cirrhosis, biliary ([place])    liver bx 01/2008  . Diastolic CHF ([place]) 3/54/6568  . Epistaxis   . Fatigue   . GERD (gastroesophageal reflux disease)   . HLD (hyperlipidemia)   . HTN (hypertension)   . Hypertension   . Leg pain    bilateral  . OSA (obstructive sleep apnea)    mild on ss of 12/2008  . Pneumonia    hx of years ago   . Short-term memory loss    related to trauma of 08/2015   . Subdural hematoma ([place]) 08/2015  . Thoracic aortic aneurysm [facility])    Past Surgical History:  Procedure Laterality Date  . ABDOMINAL AORTIC ANEURYSM REPAIR  02/01/2010  . BREAST BIOPSY    . CATARACT EXTRACTION    . COLONOSCOPY  03/08/2012   Procedure: COLONOSCOPY;  Surgeon: Gatha Mayer, MD;  Location: [facility];  Service: Endoscopy;  Laterality: N/A;  . CRANIOTOMY     for evacuation of subdural hematoma - 08/29/15   . CYSTOSCOPY WITH HOLMIUM LASER LITHOTRIPSY Right 11/05/2015   Procedure: CYSTOSCOPY WITH HOLMIUM LASER LITHOTRIPSY;  Surgeon: Irine Seal, MD;  Location: [facility];  Service: Urology;  Laterality: Right;  . CYSTOSCOPY WITH URETEROSCOPY, STONE BASKETRY AND STENT PLACEMENT Right 11/05/2015   Procedure: CYSTOSCOPY WITH URETEROSCOPY, STONE BASKETRY AND STENT EXCHANGE;  Surgeon: Irine Seal, MD;  Location: [facility];  Service: Urology;  Laterality: Right;  . ERCP N/A 04/20/2013   Procedure: ENDOSCOPIC RETROGRADE CHOLANGIOPANCREATOGRAPHY (ERCP);  Surgeon: Ladene Artist, MD;  Location: [facility];  Service: Endoscopy;  Laterality: N/A;  . ILIAC ARTERY - FEMORAL ARTERY BYPASS GRAFT  2011   Dr Early    Allergies  Allergen Reactions  . Keppra [Levetiracetam] Shortness Of Breath  . Crestor [Rosuvastatin] Other (See Comments)    Muscle weakness  .  Ezetimibe Other (See Comments)    Muscle weakness    Allergies as of 12/30/2019      Reactions   Keppra [levetiracetam] Shortness Of Breath   Crestor [rosuvastatin] Other (See Comments)   Muscle weakness   Ezetimibe Other (See Comments)   Muscle weakness      Medication List       Accurate as of December 30, 2019  3:55 PM. If you have any questions, ask your nurse or doctor.        amLODipine 5 MG tablet Commonly known as: NORVASC Take 1 tablet (5 mg total) by mouth daily. Call to schedule appointment   docusate sodium 100 MG capsule Commonly known as: COLACE Take 100 mg by mouth daily.   fenofibrate 160 MG tablet TAKE 1 TABLET BY MOUTH EVERY DAY   fluticasone 50 MCG/ACT nasal spray Commonly known as: FLONASE Place 2 sprays into both nostrils daily.   gabapentin 100 MG capsule Commonly known as: NEURONTIN Take 2 capsules (200 mg total) by mouth at bedtime.   ipratropium-albuterol 0.5-2.5 (3) MG/3ML Soln Commonly known as: DUONEB Take 3 mLs by nebulization every 6 (six) hours as needed.   losartan 100 MG tablet Commonly known as: COZAAR TAKE 1 TABLET DAILY   NON FORMULARY Diet Order: Reg textures/thin liquids. Continue all other dietary recommendations per RD.   NON FORMULARY Magic Cup with L/D meal d/t decreased intake, risk of malnutrition   ondansetron 4 MG tablet Commonly known as: ZOFRAN Take 4 mg by mouth every 8 (eight) hours as needed for nausea or vomiting.   OXYGEN Inhale 2 L into the lungs continuous.   polyethylene glycol 17 g packet Commonly known as: MIRALAX / GLYCOLAX Take 17 g by mouth daily as needed.   senna-docusate 8.6-50 MG tablet Commonly known as: Senokot-S Take 1 tablet by mouth at bedtime as needed for mild constipation.       Review of Systems  Constitutional: Negative for appetite change, chills, fatigue and fever.  Respiratory: Negative for cough, chest tightness, shortness of breath and wheezing.   Cardiovascular:  Negative for chest pain, palpitations and leg swelling.  Gastrointestinal: Negative for abdominal distention, abdominal pain, constipation, diarrhea, nausea and vomiting.  Genitourinary: Negative for urgency.       Incontinent   Musculoskeletal: Positive for gait problem. Negative for joint swelling and myalgias.  Skin: Negative for color change, pallor and rash.  Neurological: Positive for numbness. Negative for dizziness, speech difficulty, weakness, light-headedness and headaches.  Psychiatric/Behavioral: Negative for agitation, behavioral problems and sleep disturbance. The patient is not nervous/anxious.     Immunization History  Administered Date(s) Administered  . Hepatitis B 01/08/2010  . Influenza Whole 02/21/2007, 12/20/2007, 12/11/2009, 11/30/2010, 12/19/2012  . Influenza, High Dose Seasonal PF 12/14/2013, 12/31/2015, 02/08/2017, 12/22/2018  . Influenza-Unspecified 12/14/2013,  12/04/2014, 12/31/2015  . Pneumococcal Conjugate-13 06/25/2015  . Pneumococcal Polysaccharide-23 04/04/2004, 04/20/2005  . Td 04/04/2004  . Unspecified SARS-COV-2 Vaccination 08/27/2019, 10/08/2019   Pertinent  Health Maintenance Due  Topic Date Due  . INFLUENZA VACCINE  11/03/2019  . DEXA SCAN  Completed  . PNA vac Low Risk Adult  Completed   Fall Risk  11/05/2018 05/12/2017 12/31/2015 11/27/2014 05/10/2013  Falls in the past year? 0 Yes Yes No No  Comment Emmi Telephone Survey: data to providers prior to load - - - -  Number falls in past yr: - 1 1 - -  Injury with Fall? - - Yes - -  Comment - - patient report having a fall due dog tripping her and she injuried her head - -  Risk for fall due to : - Impaired mobility - - -  Follow up - Falls prevention discussed;Follow up appointment - - -    Vitals:   12/30/19 1544  BP: (!) 144/81  Pulse: 90  Resp: 20  Temp: 98 F (36.7 C)  SpO2: 96%  Weight: 225 lb (102.1 kg)  Height: 5\' 9"  (1.753 m)   Body mass index is 33.23 kg/m. Physical Exam Vitals  and nursing note reviewed.  Constitutional:      General: She is not in acute distress.    Appearance: She is obese. She is not ill-appearing.  HENT:     Mouth/Throat:     Mouth: Mucous membranes are moist.     Pharynx: Oropharynx is clear. No oropharyngeal exudate or posterior oropharyngeal erythema.  Eyes:     General: No scleral icterus.       Right eye: No discharge.        Left eye: No discharge.     Conjunctiva/sclera: Conjunctivae normal.     Pupils: Pupils are equal, round, and reactive to light.  Cardiovascular:     Rate and Rhythm: Normal rate and regular rhythm.     Pulses: Normal pulses.     Heart sounds: Normal heart sounds. No murmur heard.  No friction rub. No gallop.   Pulmonary:     Effort: Pulmonary effort is normal. No respiratory distress.     Breath sounds: Normal breath sounds. No wheezing, rhonchi or rales.  Chest:     Chest wall: No tenderness.  Abdominal:     General: Bowel sounds are normal. There is no distension.     Palpations: Abdomen is soft. There is no mass.     Tenderness: There is no abdominal tenderness. There is no right CVA tenderness, left CVA tenderness or rebound.  Musculoskeletal:        General: No swelling or tenderness.     Comments: Moves x 4 extremities.bilateral lower extremities trace edema   Skin:    General: Skin is warm and dry.     Coloration: Skin is not pale.     Findings: No bruising, erythema or rash.     Comments: Old surgical incision site on right side head   Neurological:     Mental Status: She is alert. Mental status is at baseline.     Sensory: No sensory deficit.     Coordination: Coordination normal.     Gait: Gait abnormal.  Psychiatric:        Mood and Affect: Mood normal.        Speech: Speech normal.        Behavior: Behavior normal.        Thought Content: Thought content  normal.        Judgment: Judgment normal.     Labs reviewed: Recent Labs    07/23/19 0601 07/23/19 0601 07/24/19 0557  07/24/19 0557 07/25/19 0537 07/29/19 0000 09/07/19 0000 09/26/19 0000 12/27/19 0000  NA 137   < > 138   < > 138   < > 142 139 141  K 4.2   < > 4.5   < > 4.2   < > 4.0 4.3 4.3  CL 102   < > 100   < > 105   < > 106 105 105  CO2 25   < > 28   < > 28   < > 24* 23* 25*  GLUCOSE 101*  --  110*  --  94  --   --   --   --   BUN 19   < > 18   < > 17   < > 22* 25* 25*  CREATININE 1.00   < > 1.02*   < > 0.99   < > 1.0 0.9 1.6*  CALCIUM 8.9   < > 8.9   < > 8.4*   < > 9.5 9.3 9.6  MG  --   --   --   --  2.0  --   --   --   --    < > = values in this interval not displayed.   Recent Labs    07/22/19 0539 07/24/19 0557 09/07/19 0000  AST 20 17 20   ALT 17 14 13   ALKPHOS 53 56 83  BILITOT 1.1 0.9  --   PROT 6.2* 6.4*  --   ALBUMIN 2.4* 2.5* 3.1*   Recent Labs    07/18/19 1945 07/19/19 0516 07/23/19 0601 07/23/19 0601 07/24/19 0557 07/24/19 0557 07/25/19 0537 07/25/19 0537 07/29/19 0000 09/26/19 0000 12/27/19 0000  WBC 13.4*   < > 8.7   < > 10.4   < > 8.8  --  6.2 5.7 8.9  NEUTROABS 11.6*  --   --   --   --   --   --   --   --   --   --   HGB 11.2*   < > 11.4*   < > 11.4*   < > 10.5*   < > 11.3* 11.2* 12.0  HCT 36.0   < > 37.7   < > 37.2   < > 34.1*   < > 35* 35* 38  MCV 92.5   < > 94.3  --  94.2  --  92.9  --   --   --   --   PLT 266   < > 272   < > 269   < > 247   < > 286 246 208   < > = values in this interval not displayed.   Lab Results  Component Value Date   TSH 3.97 09/26/2017   Lab Results  Component Value Date   HGBA1C 5.5 06/24/2011   Lab Results  Component Value Date   CHOL 165 09/26/2017   HDL 26.20 (L) 09/26/2017   LDLCALC 82 05/12/2017   LDLDIRECT 97.0 09/26/2017   TRIG 218.0 (H) 09/26/2017   CHOLHDL 6 09/26/2017    Significant Diagnostic Results in last 30 days:  No results found.  Assessment/Plan   Renal insufficiency CR has worsen from 1.00 to 1.64 with BUN 25.5 previous 22 .Negative exam findings except trace edema on lower extremities.    Not  on diuretics or NSAIDs.If CR worsen will decrease Losartan and increase Amlodipine. Will encouraged fluid then recheck BMP in 1 week.  Family/ staff Communication: Reviewed plan of care with patient and facility Nurse   Labs/tests ordered: BMP in 1 week.

## 2020-01-06 LAB — BASIC METABOLIC PANEL
BUN: 20 (ref 4–21)
CO2: 27 — AB (ref 13–22)
Chloride: 106 (ref 99–108)
Creatinine: 1 (ref 0.5–1.1)
Glucose: 84
Potassium: 3.8 (ref 3.4–5.3)
Sodium: 141 (ref 137–147)

## 2020-01-06 LAB — COMPREHENSIVE METABOLIC PANEL
Calcium: 9.4 (ref 8.7–10.7)
GFR calc Af Amer: 57.6
GFR calc non Af Amer: 49.7

## 2020-01-08 ENCOUNTER — Non-Acute Institutional Stay (SKILLED_NURSING_FACILITY): Payer: Medicare Other | Admitting: Family

## 2020-01-08 ENCOUNTER — Encounter: Payer: Self-pay | Admitting: Family

## 2020-01-08 DIAGNOSIS — I5032 Chronic diastolic (congestive) heart failure: Secondary | ICD-10-CM | POA: Diagnosis not present

## 2020-01-08 DIAGNOSIS — I10 Essential (primary) hypertension: Secondary | ICD-10-CM

## 2020-01-08 DIAGNOSIS — F0391 Unspecified dementia with behavioral disturbance: Secondary | ICD-10-CM

## 2020-01-08 DIAGNOSIS — E785 Hyperlipidemia, unspecified: Secondary | ICD-10-CM

## 2020-01-08 DIAGNOSIS — S50312A Abrasion of left elbow, initial encounter: Secondary | ICD-10-CM

## 2020-01-08 NOTE — Progress Notes (Signed)
Location:    [facility]   [facility] Room Number: 417-D Place of Service:  SNF (31) Provider:  Marlowe Sax, NP    Patient Care Team: Carollee Herter, Alferd Apa, DO as PCP - General Irine Seal, MD as Attending Physician (Urology)  Extended Emergency Contact Information Primary Emergency Contact: Lipke,Angela Address: [address]          [place], [place] [address] Johnnette Litter of [place] Phone: [phone] Mobile Phone: [phone] Relation: Daughter Secondary Emergency Contact: Lipke,Bob Address: [address] [place]          [place], [place] [address] Johnnette Litter of [place] Phone: [phone] Mobile Phone: [phone] Relation: Relative  Code Status:  DNR Goals of care: Advanced Directive information Advanced Directives 01/08/2020  Does Patient Have a Medical Advance Directive? Yes  Type of Paramedic of [place];Living will;Out of facility DNR (pink MOST or yellow form)  Does patient want to make changes to medical advance directive? No - Patient declined  Copy of [place] in Chart? Yes - validated most recent copy scanned in chart (See row information)  Would patient like information on creating a medical advance directive? -  Pre-existing out of facility DNR order (yellow form or pink MOST form) -     Chief Complaint  Patient presents with  . Medical Management of Chronic Issues    Routine Visit.  . Immunizations    Discuss the need for Tetanus Vaccine, and Influenza Vaccine.    HPI:  Pt is a 86 y.o. female seen today for medical management of chronic diseases. Has a medical history of Hypertension,Traumatic brain injury with prior SDH,SAH post craniotomy ,Dementia ,CHF,AAA, among others.She is seen awake in bed watching TV.she denis any acute issues during visit. Facility Nurse reports no new concerns.Has had no recent acute illness or fall episode.No recent weight for evaluation though tells me  appetite is good.  Has a left elbow skin abrasion managed by wound care Nurse.no signs of infection reported.B/p log readings in the 100's/70's - 140's/60's.   Past Medical History:  Diagnosis Date  . AAA (abdominal aortic aneurysm) [facility]) 2011   Dr Early repaired this  . Acute ischemic colitis ([place]) 03/06/2012  . Anemia    patient denies   . Arthritis    bilateral hands and knees  . Bleeding ulcer 1980s   H. Pylori  . Blood transfusion without reported diagnosis   . Cancer ([place])    hx of skin cancer on face   . Cataract    In the past  . Chronic urinary tract infection   . Cirrhosis, biliary ([place])    liver bx 01/2008  . Diastolic CHF ([place]) 2/35/5732  . Epistaxis   . Fatigue   . GERD (gastroesophageal reflux disease)   . HLD (hyperlipidemia)   . HTN (hypertension)   . Hypertension   . Leg pain    bilateral  . OSA (obstructive sleep apnea)    mild on ss of 12/2008  . Pneumonia    hx of years ago   . Short-term memory loss    related to trauma of 08/2015   . Subdural hematoma ([place]) 08/2015  . Thoracic aortic aneurysm [facility])    Past Surgical History:  Procedure Laterality Date  . ABDOMINAL AORTIC ANEURYSM REPAIR  02/01/2010  . BREAST BIOPSY    . CATARACT EXTRACTION    . COLONOSCOPY  03/08/2012   Procedure: COLONOSCOPY;  Surgeon: Gatha Mayer, MD;  Location: [facility] END[facility];  Service: Endoscopy;  Laterality: N/A;  . CRANIOTOMY     for evacuation of subdural hematoma - 08/29/15   . CYSTOSCOPY WITH HOLMIUM LASER LITHOTRIPSY Right 11/05/2015   Procedure: CYSTOSCOPY WITH HOLMIUM LASER LITHOTRIPSY;  Surgeon: Irine Seal, MD;  Location: [facility] ORS;  Service: Urology;  Laterality: Right;  . CYSTOSCOPY WITH URETEROSCOPY, STONE BASKETRY AND STENT PLACEMENT Right 11/05/2015   Procedure: CYSTOSCOPY WITH URETEROSCOPY, STONE BASKETRY AND STENT EXCHANGE;  Surgeon: Irine Seal, MD;  Location: [facility] ORS;  Service: Urology;  Laterality: Right;  . ERCP N/A 04/20/2013   Procedure: ENDOSCOPIC RETROGRADE  CHOLANGIOPANCREATOGRAPHY (ERCP);  Surgeon: Ladene Artist, MD;  Location: [facility] ORS;  Service: Endoscopy;  Laterality: N/A;  . ILIAC ARTERY - FEMORAL ARTERY BYPASS GRAFT  2011   Dr Early    Allergies  Allergen Reactions  . Keppra [Levetiracetam] Shortness Of Breath  . Crestor [Rosuvastatin] Other (See Comments)    Muscle weakness  . Ezetimibe Other (See Comments)    Muscle weakness    Allergies as of 01/08/2020      Reactions   Keppra [levetiracetam] Shortness Of Breath   Crestor [rosuvastatin] Other (See Comments)   Muscle weakness   Ezetimibe Other (See Comments)   Muscle weakness      Medication List       Accurate as of January 08, 2020 11:39 AM. If you have any questions, ask your nurse or doctor.        amLODipine 5 MG tablet Commonly known as: NORVASC Take 1 tablet (5 mg total) by mouth daily. Call to schedule appointment   docusate sodium 100 MG capsule Commonly known as: COLACE Take 100 mg by mouth daily.   fenofibrate 160 MG tablet TAKE 1 TABLET BY MOUTH EVERY DAY   fluticasone 50 MCG/ACT nasal spray Commonly known as: FLONASE Place 2 sprays into both nostrils daily.   gabapentin 100 MG capsule Commonly known as: NEURONTIN Take 2 capsules (200 mg total) by mouth at bedtime.   ipratropium-albuterol 0.5-2.5 (3) MG/3ML Soln Commonly known as: DUONEB Take 3 mLs by nebulization every 6 (six) hours as needed.   losartan 100 MG tablet Commonly known as: COZAAR TAKE 1 TABLET DAILY   NON FORMULARY Diet Order: Reg textures/thin liquids. Continue all other dietary recommendations per RD.   NON FORMULARY Magic Cup with L/D meal d/t decreased intake, risk of malnutrition   ondansetron 4 MG tablet Commonly known as: ZOFRAN Take 4 mg by mouth every 8 (eight) hours as needed for nausea or vomiting.   OXYGEN Inhale 2 L into the lungs continuous.   polyethylene glycol 17 g packet Commonly known as: MIRALAX / GLYCOLAX Take 17 g by mouth daily as needed.     senna-docusate 8.6-50 MG tablet Commonly known as: Senokot-S Take 1 tablet by mouth at bedtime as needed for mild constipation.       Review of Systems  Constitutional: Negative for appetite change, chills, fatigue and fever.  HENT: Negative for congestion, postnasal drip, rhinorrhea, sinus pressure, sinus pain, sneezing and sore throat.   Eyes: Negative for discharge, redness and itching.  Respiratory: Negative for cough, chest tightness, shortness of breath and wheezing.   Cardiovascular: Positive for leg swelling. Negative for chest pain and palpitations.  Gastrointestinal: Negative for abdominal distention, abdominal pain, constipation, diarrhea, nausea and vomiting.  Endocrine: Negative for cold intolerance, heat intolerance, polydipsia, polyphagia and polyuria.  Genitourinary: Negative for difficulty urinating, dysuria and urgency.       Incontinent  Musculoskeletal: Positive for gait problem. Negative for arthralgias, joint swelling and myalgias.  Skin: Negative for color change, pallor and rash.  Neurological: Negative for dizziness, speech difficulty, light-headedness and headaches.       Lower extremities weakness   Hematological: Does not bruise/bleed easily.  Psychiatric/Behavioral: Negative for agitation, behavioral problems and sleep disturbance. The patient is not nervous/anxious.     Immunization History  Administered Date(s) Administered  . Hepatitis B 01/08/2010  . Influenza Whole 02/21/2007, 12/20/2007, 12/11/2009, 11/30/2010, 12/19/2012  . Influenza, High Dose Seasonal PF 12/14/2013, 12/31/2015, 02/08/2017, 12/22/2018  . Influenza-Unspecified 12/14/2013, 12/04/2014, 12/31/2015  . Pneumococcal Conjugate-13 06/25/2015  . Pneumococcal Polysaccharide-23 04/04/2004, 04/20/2005  . Td 04/04/2004  . Unspecified SARS-COV-2 Vaccination 08/27/2019, 10/08/2019   Pertinent  Health Maintenance Due  Topic Date Due  . INFLUENZA VACCINE  11/03/2019  . DEXA SCAN  Completed   . PNA vac Low Risk Adult  Completed   Fall Risk  11/05/2018 05/12/2017 12/31/2015 11/27/2014 05/10/2013  Falls in the past year? 0 Yes Yes No No  Comment Emmi Telephone Survey: data to providers prior to load - - - -  Number falls in past yr: - 1 1 - -  Injury with Fall? - - Yes - -  Comment - - patient report having a fall due dog tripping her and she injuried her head - -  Risk for fall due to : - Impaired mobility - - -  Follow up - Falls prevention discussed;Follow up appointment - - -   Functional Status Survey:    Vitals:   01/08/20 1134  BP: 116/87  Pulse: 72  Resp: 20  Temp: (!) 97.5 F (36.4 C)  Weight: 225 lb (102.1 kg)  Height: 5\' 9"  (1.753 m)   Body mass index is 33.23 kg/m. Physical Exam Vitals and nursing note reviewed.  Constitutional:      General: She is not in acute distress.    Appearance: She is obese. She is not ill-appearing.  HENT:     Head: Normocephalic.     Nose: Nose normal. No congestion or rhinorrhea.     Mouth/Throat:     Mouth: Mucous membranes are moist.     Pharynx: No oropharyngeal exudate or posterior oropharyngeal erythema.  Eyes:     General: No scleral icterus.       Right eye: No discharge.        Left eye: No discharge.     Extraocular Movements: Extraocular movements intact.     Conjunctiva/sclera: Conjunctivae normal.     Pupils: Pupils are equal, round, and reactive to light.  Cardiovascular:     Rate and Rhythm: Normal rate and regular rhythm.     Pulses: Normal pulses.     Heart sounds: Normal heart sounds. No murmur heard.  No friction rub. No gallop.   Pulmonary:     Effort: Pulmonary effort is normal. No respiratory distress.     Breath sounds: Normal breath sounds. No wheezing, rhonchi or rales.  Chest:     Chest wall: No tenderness.  Abdominal:     General: Bowel sounds are normal. There is no distension.     Palpations: Abdomen is soft. There is no mass.     Tenderness: There is no abdominal tenderness. There is no  right CVA tenderness, left CVA tenderness, guarding or rebound.  Musculoskeletal:        General: No swelling or tenderness.     Cervical back: Normal range of motion. No rigidity or  tenderness.     Comments: Lower extremities trace edema  Lymphadenopathy:     Cervical: No cervical adenopathy.  Skin:    General: Skin is warm and dry.     Coloration: Skin is not pale.     Findings: No erythema or rash.     Comments: Left elbow skin abrasion without any signs of infection  Right side of the head old surgical incision with scab   Neurological:     Mental Status: She is alert. Mental status is at baseline.     Motor: Weakness present.     Gait: Gait abnormal.  Psychiatric:        Mood and Affect: Mood normal.        Speech: Speech normal.        Behavior: Behavior normal.        Thought Content: Thought content normal.     Labs reviewed: Recent Labs    07/23/19 0601 07/23/19 0601 07/24/19 0557 07/24/19 0557 07/25/19 0537 07/29/19 0000 09/26/19 0000 12/27/19 0000 01/06/20 0000  NA 137   < > 138   < > 138   < > 139 141 141  K 4.2   < > 4.5   < > 4.2   < > 4.3 4.3 3.8  CL 102   < > 100   < > 105   < > 105 105 106  CO2 25   < > 28   < > 28   < > 23* 25* 27*  GLUCOSE 101*  --  110*  --  94  --   --   --   --   BUN 19   < > 18   < > 17   < > 25* 25* 20  CREATININE 1.00   < > 1.02*   < > 0.99   < > 0.9 1.6* 1.0  CALCIUM 8.9   < > 8.9   < > 8.4*   < > 9.3 9.6 9.4  MG  --   --   --   --  2.0  --   --   --   --    < > = values in this interval not displayed.   Recent Labs    07/22/19 0539 07/24/19 0557 09/07/19 0000  AST 20 17 20   ALT 17 14 13   ALKPHOS 53 56 83  BILITOT 1.1 0.9  --   PROT 6.2* 6.4*  --   ALBUMIN 2.4* 2.5* 3.1*   Recent Labs    07/18/19 1945 07/19/19 0516 07/23/19 0601 07/23/19 0601 07/24/19 0557 07/24/19 0557 07/25/19 0537 07/25/19 0537 07/29/19 0000 09/26/19 0000 12/27/19 0000  WBC 13.4*   < > 8.7   < > 10.4   < > 8.8  --  6.2 5.7 8.9    NEUTROABS 11.6*  --   --   --   --   --   --   --   --   --   --   HGB 11.2*   < > 11.4*   < > 11.4*   < > 10.5*   < > 11.3* 11.2* 12.0  HCT 36.0   < > 37.7   < > 37.2   < > 34.1*   < > 35* 35* 38  MCV 92.5   < > 94.3  --  94.2  --  92.9  --   --   --   --   PLT 266   < >  272   < > 269   < > 247   < > 286 246 208   < > = values in this interval not displayed.   Lab Results  Component Value Date   TSH 3.97 09/26/2017   Lab Results  Component Value Date   HGBA1C 5.5 06/24/2011   Lab Results  Component Value Date   CHOL 165 09/26/2017   HDL 26.20 (L) 09/26/2017   LDLCALC 82 05/12/2017   LDLDIRECT 97.0 09/26/2017   TRIG 218.0 (H) 09/26/2017   CHOLHDL 6 09/26/2017    Significant Diagnostic Results in last 30 days:  No results found.  Assessment/Plan 1. Essential hypertension B/p well controlled. - continue on losartan and amlodipine   2. Chronic diastolic congestive heart failure (HCC) No signs of fluid overload   3. Hyperlipidemia, unspecified hyperlipidemia type Continue on fenofibrate 160 mg tablet daily  - Lipid panel   4. Dementia with behavioral disturbance, unspecified dementia type ([place]) No new behavioral issues  5. Abrasion of skin of left elbow Afebrile. No signs of infection  - wound care Nurse to continue to dressing changes.   Family/ staff Communication: Reviewed plan of care with patient and facility Nurse   Labs/tests ordered: - Lipid panel

## 2020-02-10 ENCOUNTER — Non-Acute Institutional Stay (SKILLED_NURSING_FACILITY): Payer: Medicare Other | Admitting: Family

## 2020-02-10 ENCOUNTER — Encounter: Payer: Self-pay | Admitting: Family

## 2020-02-10 DIAGNOSIS — I10 Essential (primary) hypertension: Secondary | ICD-10-CM | POA: Diagnosis not present

## 2020-02-10 DIAGNOSIS — F0391 Unspecified dementia with behavioral disturbance: Secondary | ICD-10-CM

## 2020-02-10 DIAGNOSIS — E785 Hyperlipidemia, unspecified: Secondary | ICD-10-CM

## 2020-02-10 DIAGNOSIS — I5032 Chronic diastolic (congestive) heart failure: Secondary | ICD-10-CM

## 2020-02-10 DIAGNOSIS — G629 Polyneuropathy, unspecified: Secondary | ICD-10-CM | POA: Diagnosis not present

## 2020-02-10 NOTE — Progress Notes (Signed)
Location:    [facility]   [facility] Room Number: 417-D Place of Service:  SNF (31) Provider:  Marlowe Sax, NP     Patient Care Team: Carollee Herter, Alferd Apa, DO as PCP - General Irine Seal, MD as Attending Physician (Urology)  Extended Emergency Contact Information Primary Emergency Contact: Lipke,Angela Address: [address]          [place], [place] [address] Johnnette Litter of [place] Phone: [phone] Mobile Phone: [phone] Relation: Daughter Secondary Emergency Contact: Lipke,Bob Address: [address] [place]          [place], [place] [address] Johnnette Litter of [place] Phone: [phone] Mobile Phone: [phone] Relation: Relative  Code Status:  DNR Goals of care: Advanced Directive information Advanced Directives 02/10/2020  Does Patient Have a Medical Advance Directive? Yes  Type of Paramedic of [place];Living will;Out of facility DNR (pink MOST or yellow form)  Does patient want to make changes to medical advance directive? No - Patient declined  Copy of [place] in Chart? Yes - validated most recent copy scanned in chart (See row information)  Would patient like information on creating a medical advance directive? -  Pre-existing out of facility DNR order (yellow form or pink MOST form) -     Chief Complaint  Patient presents with  . Medical Management of Chronic Issues    Routine Visit.   . Immunizations    Discuss the need for Tetanus Vaccine, and Influenza Vaccine.    HPI:  Pt is a 86 y.o. female seen today for medical management of chronic diseases.she has a medical history of Hypertension,AAA,prior SDH/SAH post craniotomy,Hyperlipidemia,polyneuropathy,GERD,Diastolic CHF,hx of primary biliary cirrhosis,Dementia,Failure to [employer] among other conditions.she is seen asleep in bed.Attmepted to wake patient but kept opening her eyes and then went back to sleep.Facility Nurse called to  patient's room did a sternum rub and patient yelled out " what !!" told staff to go away.she denies any acute issues but HPI limited due to patient falling asleep.Nurse states patient if patient does not sleep during the night she tends to sleep during the day and acts like this. No fever or chills reported.she continues to require oxygen supplementation via nasal cannula.  Nurse reports patient refuses to be weighed previous weight stable.Has had no swelling on the legs,cough or worsening shortness of breath. B/p readings in the 90's/60's - 130's/70's.  No fall episode reported.    Past Medical History:  Diagnosis Date  . AAA (abdominal aortic aneurysm) [facility]) 2011   Dr Early repaired this  . Acute ischemic colitis ([place]) 03/06/2012  . Anemia    patient denies   . Arthritis    bilateral hands and knees  . Bleeding ulcer 1980s   H. Pylori  . Blood transfusion without reported diagnosis   . Cancer ([place])    hx of skin cancer on face   . Cataract    In the past  . Chronic urinary tract infection   . Cirrhosis, biliary ([place])    liver bx 01/2008  . Diastolic CHF ([place]) 3/35/4562  . Epistaxis   . Fatigue   . GERD (gastroesophageal reflux disease)   . HLD (hyperlipidemia)   . HTN (hypertension)   . Hypertension   . Leg pain    bilateral  . OSA (obstructive sleep apnea)    mild on ss of 12/2008  . Pneumonia    hx of years ago   . Short-term memory loss  related to trauma of 08/2015   . Subdural hematoma ([place]) 08/2015  . Thoracic aortic aneurysm [facility])    Past Surgical History:  Procedure Laterality Date  . ABDOMINAL AORTIC ANEURYSM REPAIR  02/01/2010  . BREAST BIOPSY    . CATARACT EXTRACTION    . COLONOSCOPY  03/08/2012   Procedure: COLONOSCOPY;  Surgeon: Gatha Mayer, MD;  Location: [facility];  Service: Endoscopy;  Laterality: N/A;  . CRANIOTOMY     for evacuation of subdural hematoma - 08/29/15   . CYSTOSCOPY WITH HOLMIUM LASER LITHOTRIPSY Right 11/05/2015   Procedure:  CYSTOSCOPY WITH HOLMIUM LASER LITHOTRIPSY;  Surgeon: Irine Seal, MD;  [facility];  Service: Urology;  Laterality: Right;  . CYSTOSCOPY WITH URETEROSCOPY, STONE BASKETRY AND STENT PLACEMENT Right 11/05/2015   Procedure: CYSTOSCOPY WITH URETEROSCOPY, STONE BASKETRY AND STENT EXCHANGE;  Surgeon: Irine Seal, MD;  [facility];  Service: Urology;  Laterality: Right;  . ERCP N/A 04/20/2013   Procedure: ENDOSCOPIC RETROGRADE CHOLANGIOPANCREATOGRAPHY (ERCP);  Surgeon: Ladene Artist, MD;  [facility];  Service: Endoscopy;  Laterality: N/A;  . ILIAC ARTERY - FEMORAL ARTERY BYPASS GRAFT  2011   Dr Early    Allergies  Allergen Reactions  . Keppra [Levetiracetam] Shortness Of Breath  . Crestor [Rosuvastatin] Other (See Comments)    Muscle weakness  . Ezetimibe Other (See Comments)    Muscle weakness    Allergies as of 02/10/2020      Reactions   Keppra [levetiracetam] Shortness Of Breath   Crestor [rosuvastatin] Other (See Comments)   Muscle weakness   Ezetimibe Other (See Comments)   Muscle weakness      Medication List       Accurate as of February 10, 2020 11:32 AM. If you have any questions, ask your nurse or doctor.        amLODipine 5 MG tablet Commonly known as: NORVASC Take 1 tablet (5 mg total) by mouth daily. Call to schedule appointment   docusate sodium 100 MG capsule Commonly known as: COLACE Take 100 mg by mouth daily.   fenofibrate 160 MG tablet TAKE 1 TABLET BY MOUTH EVERY DAY   fluticasone 50 MCG/ACT nasal spray Commonly known as: FLONASE Place 2 sprays into both nostrils daily.   gabapentin 100 MG capsule Commonly known as: NEURONTIN Take 2 capsules (200 mg total) by mouth at bedtime.   ipratropium-albuterol 0.5-2.5 (3) MG/3ML Soln Commonly known as: DUONEB Take 3 mLs by nebulization every 6 (six) hours as needed.   losartan 100 MG tablet Commonly known as: COZAAR TAKE 1 TABLET DAILY   NON FORMULARY Diet Order: Reg textures/thin liquids.  Continue all other dietary recommendations per RD.   NON FORMULARY Magic Cup with L/D meal d/t decreased intake, risk of malnutrition   ondansetron 4 MG tablet Commonly known as: ZOFRAN Take 4 mg by mouth every 8 (eight) hours as needed for nausea or vomiting.   OXYGEN Inhale 2 L into the lungs continuous.   polyethylene glycol 17 g packet Commonly known as: MIRALAX / GLYCOLAX Take 17 g by mouth daily as needed.   senna-docusate 8.6-50 MG tablet Commonly known as: Senokot-S Take 1 tablet by mouth at bedtime as needed for mild constipation.       Review of Systems  Unable to perform ROS: Dementia (additional information provided by facility Nurse )   Immunization History  Administered Date(s) Administered  . Hepatitis B 01/08/2010  . Influenza Whole 02/21/2007, 12/20/2007, 12/11/2009, 11/30/2010, 12/19/2012  . Influenza,  High Dose Seasonal PF 12/14/2013, 12/31/2015, 02/08/2017, 12/22/2018  . Influenza-Unspecified 12/14/2013, 12/04/2014, 12/31/2015  . Pneumococcal Conjugate-13 06/25/2015  . Pneumococcal Polysaccharide-23 04/04/2004, 04/20/2005  . Td 04/04/2004  . Unspecified SARS-COV-2 Vaccination 08/27/2019, 10/08/2019   Pertinent  Health Maintenance Due  Topic Date Due  . INFLUENZA VACCINE  11/03/2019  . DEXA SCAN  Completed  . PNA vac Low Risk Adult  Completed   Fall Risk  11/05/2018 05/12/2017 12/31/2015 11/27/2014 05/10/2013  Falls in the past year? 0 Yes Yes No No  Comment Emmi Telephone Survey: data to providers prior to load - - - -  Number falls in past yr: - 1 1 - -  Injury with Fall? - - Yes - -  Comment - - patient report having a fall due dog tripping her and she injuried her head - -  Risk for fall due to : - Impaired mobility - - -  Follow up - Falls prevention discussed;Follow up appointment - - -   Functional Status Survey:    Vitals:   02/10/20 1126  BP: 136/70  Pulse: 74  Resp: 18  Temp: 98.1 F (36.7 C)  Weight: 225 lb (102.1 kg)  Height: 5\' 9"   (1.753 m)   Body mass index is 33.23 kg/m. Physical Exam Vitals and nursing note reviewed.  Constitutional:      General: She is not in acute distress.    Appearance: She is not ill-appearing.  HENT:     Head: Normocephalic.     Nose: Nose normal. No congestion or rhinorrhea.     Mouth/Throat:     Mouth: Mucous membranes are moist.     Pharynx: Oropharynx is clear. No oropharyngeal exudate or posterior oropharyngeal erythema.  Eyes:     General: No scleral icterus.       Right eye: No discharge.        Left eye: No discharge.     Conjunctiva/sclera: Conjunctivae normal.     Pupils: Pupils are equal, round, and reactive to light.  Cardiovascular:     Rate and Rhythm: Normal rate and regular rhythm.     Pulses: Normal pulses.     Heart sounds: Normal heart sounds. No murmur heard.  No friction rub. No gallop.   Pulmonary:     Effort: Pulmonary effort is normal. No respiratory distress.     Breath sounds: No wheezing, rhonchi or rales.     Comments: On oxygen 3 liters via nasal cannula  Chest:     Chest wall: No tenderness.  Abdominal:     General: Bowel sounds are normal. There is no distension.     Palpations: Abdomen is soft. There is no mass.     Tenderness: There is no abdominal tenderness. There is no right CVA tenderness, left CVA tenderness, guarding or rebound.  Musculoskeletal:        General: No swelling or tenderness.     Cervical back: No rigidity or tenderness.     Right lower leg: No edema.     Left lower leg: No edema.     Comments: Moves x 4 extremities in bed  Lymphadenopathy:     Cervical: No cervical adenopathy.  Skin:    General: Skin is warm and dry.     Coloration: Skin is not pale.     Findings: No bruising, erythema or rash.     Comments: Right side of the head previous surgical incision  Neurological:     Mental Status: She is alert.  Motor: Weakness present.     Gait: Gait abnormal.     Comments: Alert and oriented to self and person     Psychiatric:        Speech: Speech normal.        Behavior: Behavior is agitated.        Thought Content: Thought content normal.        Cognition and Memory: Memory is impaired.     Labs reviewed: Recent Labs    07/23/19 0601 07/23/19 0601 07/24/19 0557 07/24/19 0557 07/25/19 0537 07/29/19 0000 09/26/19 0000 12/27/19 0000 01/06/20 0000  NA 137   < > 138   < > 138   < > 139 141 141  K 4.2   < > 4.5   < > 4.2   < > 4.3 4.3 3.8  CL 102   < > 100   < > 105   < > 105 105 106  CO2 25   < > 28   < > 28   < > 23* 25* 27*  GLUCOSE 101*  --  110*  --  94  --   --   --   --   BUN 19   < > 18   < > 17   < > 25* 25* 20  CREATININE 1.00   < > 1.02*   < > 0.99   < > 0.9 1.6* 1.0  CALCIUM 8.9   < > 8.9   < > 8.4*   < > 9.3 9.6 9.4  MG  --   --   --   --  2.0  --   --   --   --    < > = values in this interval not displayed.   Recent Labs    07/22/19 0539 07/24/19 0557 09/07/19 0000  AST 20 17 20   ALT 17 14 13   ALKPHOS 53 56 83  BILITOT 1.1 0.9  --   PROT 6.2* 6.4*  --   ALBUMIN 2.4* 2.5* 3.1*   Recent Labs    07/18/19 1945 07/19/19 0516 07/23/19 0601 07/23/19 0601 07/24/19 0557 07/24/19 0557 07/25/19 0537 07/25/19 0537 07/29/19 0000 09/26/19 0000 12/27/19 0000  WBC 13.4*   < > 8.7   < > 10.4   < > 8.8  --  6.2 5.7 8.9  NEUTROABS 11.6*  --   --   --   --   --   --   --   --   --   --   HGB 11.2*   < > 11.4*   < > 11.4*   < > 10.5*   < > 11.3* 11.2* 12.0  HCT 36.0   < > 37.7   < > 37.2   < > 34.1*   < > 35* 35* 38  MCV 92.5   < > 94.3  --  94.2  --  92.9  --   --   --   --   PLT 266   < > 272   < > 269   < > 247   < > 286 246 208   < > = values in this interval not displayed.   Lab Results  Component Value Date   TSH 3.97 09/26/2017   Lab Results  Component Value Date   HGBA1C 5.5 06/24/2011   Lab Results  Component Value Date   CHOL 165 09/26/2017   HDL 26.20 (L) 09/26/2017   LDLCALC 82 05/12/2017  LDLDIRECT 97.0 09/26/2017   TRIG 218.0 (H) 09/26/2017    CHOLHDL 6 09/26/2017    Significant Diagnostic Results in last 30 days:  No results found.  Assessment/Plan 1. Essential hypertension B/p well controlled. Continue on losartan and amlodipine  CBC/diff,BMP   2. Chronic diastolic congestive heart failure (HCC) Declines weight check.  No signs of fluid overload.  3. Hyperlipidemia, unspecified hyperlipidemia type Continue on fenofibrate  4. Polyneuropathy Continue on gabapentin   5. Dementia with behavioral disturbance, unspecified dementia type ([place]) Easily agitated this visit. Continue with supportive care  CBC/diff,BMP    Family/ staff Communication: Reviewed plan of care with facility Nurse   Labs/tests ordered: CBC/diff,BMP

## 2020-02-11 ENCOUNTER — Encounter: Payer: Self-pay | Admitting: Internal Medicine

## 2020-02-11 ENCOUNTER — Non-Acute Institutional Stay (SKILLED_NURSING_FACILITY): Payer: Medicare Other | Admitting: Internal Medicine

## 2020-02-11 DIAGNOSIS — K745 Biliary cirrhosis, unspecified: Secondary | ICD-10-CM

## 2020-02-11 DIAGNOSIS — U071 COVID-19: Secondary | ICD-10-CM | POA: Diagnosis not present

## 2020-02-11 DIAGNOSIS — G9341 Metabolic encephalopathy: Secondary | ICD-10-CM

## 2020-02-11 DIAGNOSIS — Z7189 Other specified counseling: Secondary | ICD-10-CM

## 2020-02-11 DIAGNOSIS — F0391 Unspecified dementia with behavioral disturbance: Secondary | ICD-10-CM

## 2020-02-11 DIAGNOSIS — Z66 Do not resuscitate: Secondary | ICD-10-CM

## 2020-02-11 DIAGNOSIS — I5032 Chronic diastolic (congestive) heart failure: Secondary | ICD-10-CM

## 2020-02-11 DIAGNOSIS — R638 Other symptoms and signs concerning food and fluid intake: Secondary | ICD-10-CM | POA: Diagnosis not present

## 2020-02-11 DIAGNOSIS — I272 Pulmonary hypertension, unspecified: Secondary | ICD-10-CM

## 2020-02-11 DIAGNOSIS — I10 Essential (primary) hypertension: Secondary | ICD-10-CM

## 2020-02-12 NOTE — Progress Notes (Addendum)
Location:  [facility] Room Number: [place] of Service:  SNF (31) Provider:  Radley Teston L. Mariea Clonts, D.O., C.M.D.  Gayland Curry, DO  Patient Care Team: Gayland Curry, DO as PCP - General (Geriatric Medicine) Irine Seal, MD as Attending Physician (Urology)  Extended Emergency Contact Information Primary Emergency Contact: Lipke,Angela Address: [address]          [place], [place] [address] Johnnette Litter of [place] Phone: [phone] Mobile Phone: [phone] Relation: Daughter Secondary Emergency Contact: Lipke,Bob Address: [address]          [place], [place] [address] Johnnette Litter of [place] Phone: [phone] Mobile Phone: [phone] Relation: Relative  Code Status:  DNR Goals of care: Advanced Directive information Advanced Directives 02/11/2020  Does Patient Have a Medical Advance Directive? Yes  Type of Advance Directive Out of facility DNR (pink MOST or yellow form)  Does patient want to make changes to medical advance directive? No - Patient declined  Copy of [place] in Chart? -  Would patient like information on creating a medical advance directive? -  Pre-existing out of facility DNR order (yellow form or pink MOST form) -     Chief Complaint  Patient presents with  . Acute Visit    Weakness, pallor, congestion, non-productive cough, poor appetitie, slepping more and is on oxygen    HPI:  Pt is a 86 y.o. female with PMH significant for dementia, pulmonary htn, diastolic chf, OSA, aspiration, dysphagia, subdural hemorrhage with stroke, prior tobacco abuse among others  here at [facility] for long-term care under [facility] seen today for an acute visit for above symptoms and tested positive for covid-19.  Pt was drowsy and nonverbal at this time.    I met with her and her daughters.  Levada Dy primarily spoke with her sister was in agreement with the discussion.     Past Medical History:  Diagnosis  Date  . AAA (abdominal aortic aneurysm) [facility]) 2011   Dr Early repaired this  . Acute ischemic colitis ([place]) 03/06/2012  . Anemia    patient denies   . Arthritis    bilateral hands and knees  . Bleeding ulcer 1980s   H. Pylori  . Blood transfusion without reported diagnosis   . Cancer ([place])    hx of skin cancer on face   . Cataract    In the past  . Chronic urinary tract infection   . Cirrhosis, biliary ([place])    liver bx 01/2008  . Diastolic CHF ([place]) 8/83/2549  . Epistaxis   . Fatigue   . GERD (gastroesophageal reflux disease)   . HLD (hyperlipidemia)   . HTN (hypertension)   . Hypertension   . Leg pain    bilateral  . OSA (obstructive sleep apnea)    mild on ss of 12/2008  . Pneumonia    hx of years ago   . Short-term memory loss    related to trauma of 08/2015   . Subdural hematoma ([place]) 08/2015  . Thoracic aortic aneurysm [facility])    Past Surgical History:  Procedure Laterality Date  . ABDOMINAL AORTIC ANEURYSM REPAIR  02/01/2010  . BREAST BIOPSY    . CATARACT EXTRACTION    . COLONOSCOPY  03/08/2012   Procedure: COLONOSCOPY;  Surgeon: Gatha Mayer, MD;  Location: [facility];  Service: Endoscopy;  Laterality: N/A;  . CRANIOTOMY     for evacuation of subdural hematoma - 08/29/15   .  CYSTOSCOPY WITH HOLMIUM LASER LITHOTRIPSY Right 11/05/2015   Procedure: CYSTOSCOPY WITH HOLMIUM LASER LITHOTRIPSY;  Surgeon: Irine Seal, MD;  [facility];  Service: Urology;  Laterality: Right;  . CYSTOSCOPY WITH URETEROSCOPY, STONE BASKETRY AND STENT PLACEMENT Right 11/05/2015   Procedure: CYSTOSCOPY WITH URETEROSCOPY, STONE BASKETRY AND STENT EXCHANGE;  Surgeon: Irine Seal, MD;  [facility];  Service: Urology;  Laterality: Right;  . ERCP N/A 04/20/2013   Procedure: ENDOSCOPIC RETROGRADE CHOLANGIOPANCREATOGRAPHY (ERCP);  Surgeon: Ladene Artist, MD;  [facility];  Service: Endoscopy;  Laterality: N/A;  . ILIAC ARTERY - FEMORAL ARTERY BYPASS GRAFT  2011   Dr Early     Allergies  Allergen Reactions  . Keppra [Levetiracetam] Shortness Of Breath  . Crestor [Rosuvastatin] Other (See Comments)    Muscle weakness  . Ezetimibe Other (See Comments)    Muscle weakness    Outpatient Encounter Medications as of 02/11/2020  Medication Sig  . amLODipine (NORVASC) 5 MG tablet Take 1 tablet (5 mg total) by mouth daily. Call to schedule appointment  . docusate sodium (COLACE) 100 MG capsule Take 100 mg by mouth daily.  . fenofibrate 160 MG tablet TAKE 1 TABLET BY MOUTH EVERY DAY  . fluticasone (FLONASE) 50 MCG/ACT nasal spray Place 2 sprays into both nostrils daily.  Marland Kitchen gabapentin (NEURONTIN) 100 MG capsule Take 2 capsules (200 mg total) by mouth at bedtime.  Marland Kitchen ipratropium-albuterol (DUONEB) 0.5-2.5 (3) MG/3ML SOLN Take 3 mLs by nebulization every 6 (six) hours as needed.  Marland Kitchen losartan (COZAAR) 100 MG tablet TAKE 1 TABLET DAILY  . NON FORMULARY Diet Order: Reg textures/thin liquids. Continue all other dietary recommendations per RD.  . NON FORMULARY Magic Cup with L/D meal d/t decreased intake, risk of malnutrition  . ondansetron (ZOFRAN) 4 MG tablet Take 4 mg by mouth every 8 (eight) hours as needed for nausea or vomiting.  . OXYGEN Inhale 2 L into the lungs continuous.  . polyethylene glycol (MIRALAX / GLYCOLAX) 17 g packet Take 17 g by mouth daily as needed.  . senna-docusate (SENOKOT-S) 8.6-50 MG tablet Take 1 tablet by mouth at bedtime as needed for mild constipation.   No facility-administered encounter medications on file as of 02/11/2020.    Review of Systems  Unable to perform ROS: Dementia    Immunization History  Administered Date(s) Administered  . Hepatitis B 01/08/2010  . Influenza Whole 02/21/2007, 12/20/2007, 12/11/2009, 11/30/2010, 12/19/2012  . Influenza, High Dose Seasonal PF 12/14/2013, 12/31/2015, 02/08/2017, 12/22/2018  . Influenza-Unspecified 12/14/2013, 12/04/2014, 12/31/2015  . Pneumococcal Conjugate-13 06/25/2015  . Pneumococcal  Polysaccharide-23 04/04/2004, 04/20/2005  . Td 04/04/2004  . Unspecified SARS-COV-2 Vaccination 08/27/2019, 10/08/2019   Pertinent  Health Maintenance Due  Topic Date Due  . INFLUENZA VACCINE  11/03/2019  . DEXA SCAN  Completed  . PNA vac Low Risk Adult  Completed   Fall Risk  11/05/2018 05/12/2017 12/31/2015 11/27/2014 05/10/2013  Falls in the past year? 0 Yes Yes No No  Comment Emmi Telephone Survey: data to providers prior to load - - - -  Number falls in past yr: - 1 1 - -  Injury with Fall? - - Yes - -  Comment - - patient report having a fall due dog tripping her and she injuried her head - -  Risk for fall due to : - Impaired mobility - - -  Follow up - Falls prevention discussed;Follow up appointment - - -   Functional Status Survey:    Vitals:  02/11/20 1131  BP: 109/70  Pulse: 83  Resp: 20  Temp: 98.4 F (36.9 C)  SpO2: 96%  Weight: 225 lb (102.1 kg)  Height: '5\' 9"'  (1.753 m)   Body mass index is 33.23 kg/m. Physical Exam Constitutional:      Appearance: She is ill-appearing.     Comments: Lethargic, appears to feel poorly  HENT:     Head:     Comments: Right scalp with crusted areas     Right Ear: External ear normal.     Left Ear: External ear normal.     Nose: Nose normal.     Mouth/Throat:     Pharynx: Oropharynx is clear.  Eyes:     Conjunctiva/sclera: Conjunctivae normal.     Pupils: Pupils are equal, round, and reactive to light.  Cardiovascular:     Rate and Rhythm: Normal rate and regular rhythm.  Pulmonary:     Breath sounds: Rhonchi present.     Comments: Increased respiratory rate Abdominal:     General: Bowel sounds are normal.     Palpations: Abdomen is soft.     Tenderness: There is no abdominal tenderness. There is no guarding or rebound.  Musculoskeletal:     Cervical back: Neck supple.     Right lower leg: No edema.     Left lower leg: No edema.  Skin:    Coloration: Skin is pale.  Neurological:     Motor: Weakness present.      Comments: Lethargic, minimally interactive     Labs reviewed: Recent Labs    07/23/19 0601 07/23/19 0601 07/24/19 0557 07/24/19 0557 07/25/19 0537 07/29/19 0000 09/26/19 0000 12/27/19 0000 01/06/20 0000  NA 137   < > 138   < > 138   < > 139 141 141  K 4.2   < > 4.5   < > 4.2   < > 4.3 4.3 3.8  CL 102   < > 100   < > 105   < > 105 105 106  CO2 25   < > 28   < > 28   < > 23* 25* 27*  GLUCOSE 101*  --  110*  --  94  --   --   --   --   BUN 19   < > 18   < > 17   < > 25* 25* 20  CREATININE 1.00   < > 1.02*   < > 0.99   < > 0.9 1.6* 1.0  CALCIUM 8.9   < > 8.9   < > 8.4*   < > 9.3 9.6 9.4  MG  --   --   --   --  2.0  --   --   --   --    < > = values in this interval not displayed.   Recent Labs    07/22/19 0539 07/24/19 0557 09/07/19 0000  AST '20 17 20  ' ALT '17 14 13  ' ALKPHOS 53 56 83  BILITOT 1.1 0.9  --   PROT 6.2* 6.4*  --   ALBUMIN 2.4* 2.5* 3.1*   Recent Labs    07/18/19 1945 07/19/19 0516 07/23/19 0601 07/23/19 0601 07/24/19 0557 07/24/19 0557 07/25/19 0537 07/25/19 0537 07/29/19 0000 09/26/19 0000 12/27/19 0000  WBC 13.4*   < > 8.7   < > 10.4   < > 8.8  --  6.2 5.7 8.9  NEUTROABS 11.6*  --   --   --   --   --   --   --   --   --   --  HGB 11.2*   < > 11.4*   < > 11.4*   < > 10.5*   < > 11.3* 11.2* 12.0  HCT 36.0   < > 37.7   < > 37.2   < > 34.1*   < > 35* 35* 38  MCV 92.5   < > 94.3  --  94.2  --  92.9  --   --   --   --   PLT 266   < > 272   < > 269   < > 247   < > 286 246 208   < > = values in this interval not displayed.   Lab Results  Component Value Date   TSH 3.97 09/26/2017   Lab Results  Component Value Date   HGBA1C 5.5 06/24/2011   Lab Results  Component Value Date   CHOL 165 09/26/2017   HDL 26.20 (L) 09/26/2017   LDLCALC 82 05/12/2017   LDLDIRECT 97.0 09/26/2017   TRIG 218.0 (H) 09/26/2017   CHOLHDL 6 09/26/2017    Significant Diagnostic Results in last 30 days:  No results found.  Assessment/Plan 1.  COVID-19 -Subcutaneous injections of monoclonal antibody--4 injections for 652m dose  -Attempt to give a liter of NS at 80cc per hour due to poor po intake related to the illness though that was not great prior -may need to add prednisone, as well -doubt she can use inhaler with her degree of weakness  2. Acute metabolic encephalopathy -more lethargic, less interactive -tx with fluids and monoclonal ab when received from pharmacy  3. Poor fluid intake -at leas initial IVFs, but may not tolerate much in view of her pulmonary htn and chronic diastolic chf  4. Dementia with behavioral disturbance, unspecified dementia type ([place] -advanced after subdural traumatic hemorrhage -here for long-term care -overall goals are comfort based with DNR in place as confirmed today, but her daughters did agree to treating with the infusion in homes it would decrease the severity of her disease  5. Chronic diastolic congestive heart failure ([place] -appears hypovolemic in view of poor po intake, malaise from covid  6. ACP- 28 mins spent with family discussing goals and plan as described several other places in note  -DNR Do not attempt resuscitation (DNR) confirmed and reentered  7. Pulmonary hypertension ([place] -underlying with chronic bronchitis from years of tobacco abuse -monitor carefully with gentle hydration  8. Essential hypertension -bp on low end, monitor  Family/ staff Communication: met with daughters who request tx with the antibody and comfort measures, do not hospitalize  Labs/tests ordered:  No new--avoid too many staff going in and out when lab results will not affect mgt  Dayn Barich L. Ysabela Keisler, D.O. [facility] [address] [place] [id number] Phone (Mon-Fri 8am-5pm):  [phone] Call:  [phone]& follow prompts after 5pm & weekends Office Phone:  [phone] Fax:  [phone]

## 2020-02-13 ENCOUNTER — Emergency Department (HOSPITAL_COMMUNITY)
Admission: EM | Admit: 2020-02-13 | Discharge: 2020-02-13 | Disposition: A | Discharge status: Home / Self Care | Payer: Medicare Other | Attending: Emergency Medicine | Admitting: Emergency Medicine

## 2020-02-13 ENCOUNTER — Emergency Department (HOSPITAL_COMMUNITY): Payer: Medicare Other

## 2020-02-13 ENCOUNTER — Other Ambulatory Visit: Payer: Self-pay

## 2020-02-13 DIAGNOSIS — Z79899 Other long term (current) drug therapy: Secondary | ICD-10-CM | POA: Diagnosis not present

## 2020-02-13 DIAGNOSIS — W06XXXA Fall from bed, initial encounter: Secondary | ICD-10-CM | POA: Insufficient documentation

## 2020-02-13 DIAGNOSIS — W19XXXA Unspecified fall, initial encounter: Secondary | ICD-10-CM

## 2020-02-13 DIAGNOSIS — U071 COVID-19: Secondary | ICD-10-CM

## 2020-02-13 DIAGNOSIS — F0390 Unspecified dementia without behavioral disturbance: Secondary | ICD-10-CM | POA: Insufficient documentation

## 2020-02-13 DIAGNOSIS — S0990XA Unspecified injury of head, initial encounter: Secondary | ICD-10-CM

## 2020-02-13 DIAGNOSIS — Z7722 Contact with and (suspected) exposure to environmental tobacco smoke (acute) (chronic): Secondary | ICD-10-CM | POA: Diagnosis not present

## 2020-02-13 DIAGNOSIS — Z23 Encounter for immunization: Secondary | ICD-10-CM | POA: Insufficient documentation

## 2020-02-13 DIAGNOSIS — I5030 Unspecified diastolic (congestive) heart failure: Secondary | ICD-10-CM | POA: Insufficient documentation

## 2020-02-13 DIAGNOSIS — I110 Hypertensive heart disease with heart failure: Secondary | ICD-10-CM | POA: Diagnosis not present

## 2020-02-13 DIAGNOSIS — Z87891 Personal history of nicotine dependence: Secondary | ICD-10-CM | POA: Diagnosis not present

## 2020-02-13 DIAGNOSIS — S161XXA Strain of muscle, fascia and tendon at neck level, initial encounter: Secondary | ICD-10-CM

## 2020-02-13 DIAGNOSIS — Z85828 Personal history of other malignant neoplasm of skin: Secondary | ICD-10-CM | POA: Insufficient documentation

## 2020-02-13 DIAGNOSIS — S0191XA Laceration without foreign body of unspecified part of head, initial encounter: Secondary | ICD-10-CM | POA: Insufficient documentation

## 2020-02-13 DIAGNOSIS — S0001XA Abrasion of scalp, initial encounter: Secondary | ICD-10-CM

## 2020-02-13 LAB — BASIC METABOLIC PANEL
Anion gap: 12 (ref 5–15)
BUN: 30 mg/dL — ABNORMAL HIGH (ref 8–23)
CO2: 24 mmol/L (ref 22–32)
Calcium: 9.3 mg/dL (ref 8.9–10.3)
Chloride: 110 mmol/L (ref 98–111)
Creatinine, Ser: 1.11 mg/dL — ABNORMAL HIGH (ref 0.44–1.00)
GFR, Estimated: 48 mL/min — ABNORMAL LOW (ref >60)
Glucose, Bld: 125 mg/dL — ABNORMAL HIGH (ref 70–99)
Potassium: 4.8 mmol/L (ref 3.5–5.1)
Sodium: 146 mmol/L — ABNORMAL HIGH (ref 135–145)

## 2020-02-13 LAB — CBC WITH DIFFERENTIAL/PLATELET
Abs Immature Granulocytes: 0.16 10*3/uL — ABNORMAL HIGH (ref 0.00–0.07)
Basophils Absolute: 0 10*3/uL (ref 0.0–0.1)
Basophils Relative: 0 %
Eosinophils Absolute: 0 10*3/uL (ref 0.0–0.5)
Eosinophils Relative: 0 %
HCT: 41.2 % (ref 36.0–46.0)
Hemoglobin: 12.9 g/dL (ref 12.0–15.0)
Immature Granulocytes: 2 %
Lymphocytes Relative: 16 %
Lymphs Abs: 1.5 10*3/uL (ref 0.7–4.0)
MCH: 29.5 pg (ref 26.0–34.0)
MCHC: 31.3 g/dL (ref 30.0–36.0)
MCV: 94.1 fL (ref 80.0–100.0)
Monocytes Absolute: 0.3 10*3/uL (ref 0.1–1.0)
Monocytes Relative: 4 %
Neutro Abs: 7.7 10*3/uL (ref 1.7–7.7)
Neutrophils Relative %: 78 %
Platelets: 201 10*3/uL (ref 150–400)
RBC: 4.38 MIL/uL (ref 3.87–5.11)
RDW: 15.4 % (ref 11.5–15.5)
WBC: 9.7 10*3/uL (ref 4.0–10.5)
nRBC: 0 % (ref 0.0–0.2)

## 2020-02-13 MED ORDER — SODIUM CHLORIDE 0.9 % IV SOLN
INTRAVENOUS | Status: DC | PRN
  Administered 2020-02-13: 1000 mL via INTRAVENOUS

## 2020-02-13 MED ORDER — ALBUTEROL SULFATE HFA 108 (90 BASE) MCG/ACT IN AERS: 2.0000 | INHALATION_SPRAY | Freq: Once | RESPIRATORY_TRACT | Status: DC | PRN

## 2020-02-13 MED ORDER — METHYLPREDNISOLONE SODIUM SUCC 125 MG IJ SOLR: 125.0000 mg | Freq: Once | INTRAMUSCULAR | Status: DC | PRN

## 2020-02-13 MED ORDER — EPINEPHRINE 0.3 MG/0.3ML IJ SOAJ: 0.3000 mg | Freq: Once | INTRAMUSCULAR | Status: DC | PRN

## 2020-02-13 MED ORDER — SODIUM CHLORIDE 0.9 % IV SOLN
Freq: Once | INTRAVENOUS | Status: AC
  Filled 2020-02-13: qty 20

## 2020-02-13 MED ORDER — FAMOTIDINE IN NACL 20-0.9 MG/50ML-% IV SOLN: 20.0000 mg | Freq: Once | INTRAVENOUS | Status: DC | PRN

## 2020-02-13 MED ORDER — SODIUM CHLORIDE 0.9 % IV SOLN: 1200.0000 mg | Freq: Once | INTRAVENOUS | Status: DC

## 2020-02-13 MED ORDER — DIPHENHYDRAMINE HCL 50 MG/ML IJ SOLN: 50.0000 mg | Freq: Once | INTRAMUSCULAR | Status: DC | PRN

## 2020-02-13 NOTE — ED Provider Notes (Signed)
[facility] DEPT Provider Note   CSN: [id number] Arrival date & time: 02/13/20  0247     History Chief Complaint  Patient presents with  . Fall    Deanna [name] is a 86 y.o. female.  Patient is an 86 year old female with a past medical history of subdural hematoma requiring craniotomy, hypertension, hyperlipidemia, sleep apnea, AAA repair.  Patient sent today from her extended care facility for evaluation of fall.  From what I am told, she fell out of bed onto the floor.  She has bleeding from the top of her head.  Patient adds no useful history secondary to confusion/dementia.  Patient was also recently diagnosed with COVID-19.  There appears to be an outbreak of this in the facility where she resides.  Patient has been on oxygen at her facility and oxygen saturations here are in the low 90s while on oxygen by nasal cannula.  The history is provided by the patient.       Past Medical History:  Diagnosis Date  . AAA (abdominal aortic aneurysm) [facility]) 2011   Dr Early repaired this  . Acute ischemic colitis ([place]) 03/06/2012  . Anemia    patient denies   . Arthritis    bilateral hands and knees  . Bleeding ulcer 1980s   H. Pylori  . Blood transfusion without reported diagnosis   . Cancer ([place])    hx of skin cancer on face   . Cataract    In the past  . Chronic urinary tract infection   . 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 Diastolic CHF ([place]) 8/33/8250  . Epistaxis   . Fatigue   . GERD (gastroesophageal reflux disease)   . HLD (hyperlipidemia)   . HTN (hypertension)   . Hypertension   . Leg pain    bilateral  . OSA (obstructive sleep apnea)    mild on ss of 12/2008  . Pneumonia    hx of years ago   . Short-term memory loss    related to trauma of 08/2015   . Subdural hematoma ([place]) 08/2015  . Thoracic aortic aneurysm [facility])     Patient Active Problem List   Diagnosis Date Noted  . Aspiration pneumonia ([place]) 07/28/2019   . Ascending aortic aneurysm ([place]) 07/28/2019  . Polyneuropathy 07/28/2019  . Abnormal urine odor 07/18/2019  . Acute non-recurrent pansinusitis 07/18/2019  . Acute metabolic encephalopathy 53/97/6734  . UTI (urinary tract infection) 07/18/2019  . Cough 07/18/2019  . Bronchitis 06/12/2018  . Acute alteration in mental status 09/26/2017  . Hyperlipidemia LDL goal <100 09/26/2017  . Open wnd of scalp 05/12/2017  . Fatigue 05/12/2017  . Calculi, ureter 09/04/2015  . Acute lower UTI 09/04/2015  . Biliary and gallbladder disorder 09/02/2015  . Descending thoracic aortic aneurysm ([place]) 09/02/2015  . Subdural hemorrhage ([place]) 08/29/2015  . Injury inflicted to the body by an external force 08/29/2015  . Pain in the abdomen 06/11/2014  . Constipation 06/11/2014  . Preop cardiovascular exam 05/24/2013  . Obesity (BMI 30-39.9) 05/12/2013  . Pulmonary hypertension ([place]) 04/21/2013  . Diastolic CHF ([place]) 19/37/9024  . Acute cholangitis 04/20/2013  . Dyspnea on exertion 04/20/2013  . Calculus of bile duct without mention of cholecystitis or obstruction 04/20/2013  . Nonspecific (abnormal) findings on radiological and other examination of biliary tract 04/20/2013  . Celiac artery stenosis ([place]) 05/01/2012  . Hyperkalemia 03/06/2012  . Dehydration 03/06/2012  . Leukocytosis 03/06/2012  . Leg weakness, bilateral  01/01/2012  . HYPERGLYCEMIA 06/15/2010  . SMOKER 01/29/2010  . MURMUR 01/29/2010  . ELECTROCARDIOGRAM, ABNORMAL 01/29/2010  . BILIARY CIRRHOSIS, PRIMARY 01/08/2010  . FATIGUE 06/26/2009  . EPISTAXIS 06/26/2009  . OBSTRUCTIVE SLEEP APNEA 12/19/2008  . LEG PAIN, BILATERAL 04/08/2008  . GERD 12/27/2007  . ALANINE AMINOTRANSFERASE, SERUM, ELEVATED 12/05/2007  . Nonspecific abnormal results of liver function study 07/04/2007  . Hyperlipidemia 06/13/2007  . Essential hypertension 11/06/2006    Past Surgical History:  Procedure Laterality Date  . ABDOMINAL AORTIC ANEURYSM REPAIR   02/01/2010  . BREAST BIOPSY    . CATARACT EXTRACTION    . COLONOSCOPY  03/08/2012   Procedure: COLONOSCOPY;  Surgeon: Gatha Mayer, MD;  Location: [facility];  Service: Endoscopy;  Laterality: N/A;  . CRANIOTOMY     for evacuation of subdural hematoma - 08/29/15   . CYSTOSCOPY WITH HOLMIUM LASER LITHOTRIPSY Right 11/05/2015   Procedure: CYSTOSCOPY WITH HOLMIUM LASER LITHOTRIPSY;  Surgeon: Irine Seal, MD;  Location: [facility];  Service: Urology;  Laterality: Right;  . CYSTOSCOPY WITH URETEROSCOPY, STONE BASKETRY AND STENT PLACEMENT Right 11/05/2015   Procedure: CYSTOSCOPY WITH URETEROSCOPY, STONE BASKETRY AND STENT EXCHANGE;  Surgeon: Irine Seal, MD;  Location: [facility];  Service: Urology;  Laterality: Right;  . ERCP N/A 04/20/2013   Procedure: ENDOSCOPIC RETROGRADE CHOLANGIOPANCREATOGRAPHY (ERCP);  Surgeon: Ladene Artist, MD;  Location: [facility];  Service: Endoscopy;  Laterality: N/A;  . ILIAC ARTERY - FEMORAL ARTERY BYPASS GRAFT  2011   Dr Donnetta Hutching     OB History   No obstetric history on file.     Family History  Problem Relation Age of Onset  . Cancer Brother        prostate    Social History   Tobacco Use  . Smoking status: Former Smoker    Years: 52.00    Types: Cigarettes    Quit date: 02/01/2010    Years since quitting: 10.0  . Smokeless tobacco: Never Used  Substance Use Topics  . Alcohol use: No    Alcohol/week: 0.0 standard drinks  . Drug use: No    Home Medications Prior to Admission medications   Medication Sig Start Date End Date Taking? Authorizing Provider  amLODipine (NORVASC) 5 MG tablet Take 1 tablet (5 mg total) by mouth daily. Call to schedule appointment 11/08/19   Carollee Herter, Alferd Apa, DO  docusate sodium (COLACE) 100 MG capsule Take 100 mg by mouth daily.    [provider]  fenofibrate 160 MG tablet TAKE 1 TABLET BY MOUTH EVERY DAY 07/25/19   Carollee Herter, Alferd Apa, DO  fluticasone (FLONASE) 50 MCG/ACT nasal spray Place 2 sprays into both nostrils  daily. 07/18/19   Ann Held, DO  gabapentin (NEURONTIN) 100 MG capsule Take 2 capsules (200 mg total) by mouth at bedtime. 07/25/19   Amin, Ankit Chirag, MD  ipratropium-albuterol (DUONEB) 0.5-2.5 (3) MG/3ML SOLN Take 3 mLs by nebulization every 6 (six) hours as needed. 07/25/19   Damita Lack, MD  losartan (COZAAR) 100 MG tablet TAKE 1 TABLET DAILY 04/24/19   Carollee Herter, Alferd Apa, DO  NON FORMULARY Diet Order: Reg textures/thin liquids. Continue all other dietary recommendations per RD. 08/01/19   [provider]  NON FORMULARY Magic Cup with L/D meal d/t decreased intake, risk of malnutrition 08/09/19   [provider]  ondansetron (ZOFRAN) 4 MG tablet Take 4 mg by mouth every 8 (eight) hours as needed for nausea or vomiting.  [provider]  OXYGEN Inhale 2 L into the lungs continuous.    [provider]  polyethylene glycol (MIRALAX / GLYCOLAX) 17 g packet Take 17 g by mouth daily as needed. 07/25/19   Amin, Ankit Chirag, MD  senna-docusate (SENOKOT-S) 8.6-50 MG tablet Take 1 tablet by mouth at bedtime as needed for mild constipation. 07/25/19   Damita Lack, MD    Allergies    Keppra [levetiracetam], Crestor [rosuvastatin], and Ezetimibe  Review of Systems   Review of Systems  Unable to perform ROS: Dementia    Physical Exam Updated Vital Signs BP 104/72   Pulse (!) 102   Temp 98.7 F (37.1 C) (Axillary)   SpO2 95%   Physical Exam Vitals and nursing note reviewed.  Constitutional:      General: She is not in acute distress.    Appearance: She is well-developed. She is not diaphoretic.  HENT:     Head:     Comments: There are postsurgical changes to the right parietal region.  Patient has several areas of scabbing and depressions of the skull.  There is matted blood within her hair which appears to be coming from a depression in the skull. Cardiovascular:     Rate and Rhythm: Normal rate and regular rhythm.     Heart  sounds: No murmur heard.  No friction rub. No gallop.   Pulmonary:     Effort: Pulmonary effort is normal. No respiratory distress.     Breath sounds: Normal breath sounds. No wheezing.  Abdominal:     General: Bowel sounds are normal. There is no distension.     Palpations: Abdomen is soft.     Tenderness: There is no abdominal tenderness.  Musculoskeletal:        General: Normal range of motion.     Cervical back: Normal range of motion and neck supple.  Skin:    General: Skin is warm and dry.  Neurological:     Mental Status: She is alert.     Comments: Patient is awake and alert.  She will answer yes or no to questions, but does not offer much additional history.  Neurologic exam is difficult secondary to this.  She is able to move all 4 extremities with purpose.     ED Results / Procedures / Treatments   Labs (all labs ordered are listed, but only abnormal results are displayed) Labs Reviewed  BASIC METABOLIC PANEL - Abnormal; Notable for the following components:      Result Value   Sodium 146 (*)    Glucose, Bld 125 (*)    BUN 30 (*)    Creatinine, Ser 1.11 (*)    GFR, Estimated 48 (*)    All other components within normal limits  CBC WITH DIFFERENTIAL/PLATELET - Abnormal; Notable for the following components:   Abs Immature Granulocytes 0.16 (*)    All other components within normal limits    EKG None  Radiology CT Head Wo Contrast  Result Date: 02/13/2020 CLINICAL DATA:  Head trauma with laceration EXAM: CT HEAD WITHOUT CONTRAST CT CERVICAL SPINE WITHOUT CONTRAST TECHNIQUE: Multidetector CT imaging of the head and cervical spine was performed following the standard protocol without intravenous contrast. Multiplanar CT image reconstructions of the cervical spine were also generated. COMPARISON:  07/18/2019 head CT FINDINGS: CT HEAD FINDINGS Brain: No evidence of acute infarction, hemorrhage, hydrocephalus, extra-axial collection or mass lesion/mass effect. Cerebral  volume loss and chronic small vessel ischemia in keeping with age.  Vascular: No hyperdense vessel or unexpected calcification. Skull: Remote right craniotomy.  No acute fracture Sinuses/Orbits: Bilateral cataract resection. No evidence of injury. CT CERVICAL SPINE FINDINGS Alignment: No traumatic malalignment. Degenerative anterolisthesis at C5-6 and C6-7. Skull base and vertebrae: No acute fracture Soft tissues and spinal canal: No prevertebral fluid or swelling. No visible canal hematoma. Disc levels:  Ordinary degenerative changes. Upper chest: Left upper lobe scarring.  No evidence of injury Significantly motion degraded study, especially of the head. IMPRESSION: Motion degraded study with no evidence of acute intracranial or cervical spine injury. Electronically Signed   By: Monte Fantasia M.D.   On: 02/13/2020 04:58   CT Cervical Spine Wo Contrast  Result Date: 02/13/2020 CLINICAL DATA:  Head trauma with laceration EXAM: CT HEAD WITHOUT CONTRAST CT CERVICAL SPINE WITHOUT CONTRAST TECHNIQUE: Multidetector CT imaging of the head and cervical spine was performed following the standard protocol without intravenous contrast. Multiplanar CT image reconstructions of the cervical spine were also generated. COMPARISON:  07/18/2019 head CT FINDINGS: CT HEAD FINDINGS Brain: No evidence of acute infarction, hemorrhage, hydrocephalus, extra-axial collection or mass lesion/mass effect. Cerebral volume loss and chronic small vessel ischemia in keeping with age. Vascular: No hyperdense vessel or unexpected calcification. Skull: Remote right craniotomy.  No acute fracture Sinuses/Orbits: Bilateral cataract resection. No evidence of injury. CT CERVICAL SPINE FINDINGS Alignment: No traumatic malalignment. Degenerative anterolisthesis at C5-6 and C6-7. Skull base and vertebrae: No acute fracture Soft tissues and spinal canal: No prevertebral fluid or swelling. No visible canal hematoma. Disc levels:  Ordinary degenerative  changes. Upper chest: Left upper lobe scarring.  No evidence of injury Significantly motion degraded study, especially of the head. IMPRESSION: Motion degraded study with no evidence of acute intracranial or cervical spine injury. Electronically Signed   By: Monte Fantasia M.D.   On: 02/13/2020 04:58   DG Chest Port 1 View  Result Date: 02/13/2020 CLINICAL DATA:  COVID-19.  Hypoxia. EXAM: PORTABLE CHEST 1 VIEW COMPARISON:  07/25/2019 FINDINGS: Unchanged appearance of thoracic aortic aneurysm. Left basilar atelectasis. Normal pleural spaces. Lungs otherwise clear. IMPRESSION: Unchanged appearance of thoracic aortic aneurysm. No focal airspace consolidation. Electronically Signed   By: Ulyses Jarred M.D.   On: 02/13/2020 03:56    Procedures Procedures (including critical care time)  Medications Ordered in ED Medications  0.9 %  sodium chloride infusion (has no administration in time range)  diphenhydrAMINE (BENADRYL) injection 50 mg (has no administration in time range)  famotidine (PEPCID) IVPB 20 mg premix (has no administration in time range)  methylPREDNISolone sodium succinate (SOLU-MEDROL) 125 mg/2 mL injection 125 mg (has no administration in time range)  albuterol (VENTOLIN HFA) 108 (90 Base) MCG/ACT inhaler 2 puff (has no administration in time range)  EPINEPHrine (EPI-PEN) injection 0.3 mg (has no administration in time range)  bamlanivimab 700 mg, etesevimab 1,400 mg in sodium chloride 0.9 % 160 mL IVPB (has no administration in time range)    ED Course  I have reviewed the triage vital signs and the nursing notes.  Pertinent labs & imaging results that were available during my care of the patient were reviewed by me and considered in my medical decision making (see chart for details).    MDM Rules/Calculators/A&P  Patient brought for evaluation of fall causing bleeding from the scalp.  She was evaluated immediately upon arrival.  Vitals are stable with some mild  hypoxia.  Evaluation of the scalp reveals postsurgical changes to the right parietal region.  There appears to have  been bleeding from 1 of these areas, however this has since resolved.  The matted blood was removed and some of her hair trimmed away, with no other site of bleeding or laceration noted.  Patient underwent CT imaging studies of her head and neck as well as an x-ray of her chest.  These were all negative for acute fracture.  There is no evidence for infiltrate or other abnormality on her x-ray.  At this point, the bleeding appears to have resolved and patient appears none no significant distress.  She is somewhat restless and disoriented with an oxygen requirement of 2 L to maintain her saturations in the 90s.  I have reviewed the patient's record from [facility].  She was evaluated yesterday by Dr. Mariea Clonts.  Dr. Mariea Clonts had discussion with the patient's daughter, Levada Dy and the decision was made not to perform any heroic actions, just to make her comfortable.  They did agree upon monoclonal antibody infusion.  This was to be done yesterday, however this apparently was not completed.  I have also discussed the situation with Levada Dy, the patient's daughter and informed her of her mother's condition.  Levada Dy is consistent that patient is a DNR and that no heroic activities are to be taken, and does not want her hospitalized.  She has inquired about administering the monoclonal antibodies here prior to discharge.  I will order this.  Once this infusion is complete.  Patient seems appropriate for return to her extended care facility.  Final Clinical Impression(s) / ED Diagnoses Final diagnoses:  None    Rx / DC Orders ED Discharge Orders    None       Veryl Speak, MD 02/13/20 [phone]

## 2020-02-13 NOTE — Discharge Instructions (Signed)
Continue medications as previously prescribed.  Follow-up with primary doctor in the next few days, and return to the ER if symptoms significantly worsen or change. 

## 2020-02-13 NOTE — ED Triage Notes (Signed)
Per EMS they were told that when they did their checks, they found her in the bed bleeding from the top of her head, they don't know when or how she fell and received the laceration to the top of her head Pt tested positive for COVID on the 9th

## 2020-02-13 NOTE — ED Notes (Signed)
Pt cleaned up, linen changed and repositioned in bed by this RN and tech Max. Pt seems more relaxed at this time.

## 2020-02-13 NOTE — ED Notes (Signed)
Patient provided the monoclonal antibody infusion fact sheet.

## 2020-02-14 ENCOUNTER — Non-Acute Institutional Stay (SKILLED_NURSING_FACILITY): Payer: Medicare Other | Admitting: Internal Medicine

## 2020-02-14 ENCOUNTER — Encounter: Payer: Self-pay | Admitting: Internal Medicine

## 2020-02-14 DIAGNOSIS — U071 COVID-19: Secondary | ICD-10-CM | POA: Diagnosis not present

## 2020-02-14 DIAGNOSIS — F419 Anxiety disorder, unspecified: Secondary | ICD-10-CM

## 2020-02-14 DIAGNOSIS — G9341 Metabolic encephalopathy: Secondary | ICD-10-CM

## 2020-02-14 DIAGNOSIS — F0391 Unspecified dementia with behavioral disturbance: Secondary | ICD-10-CM | POA: Diagnosis not present

## 2020-02-14 MED ORDER — LORAZEPAM 2 MG/ML PO CONC: 0.6000 mg | Freq: Four times a day (QID) | ORAL | 0 refills | Status: AC | PRN

## 2020-02-14 MED ORDER — OXYCODONE HCL 5 MG/5ML PO SOLN: 5.0000 mg | Freq: Four times a day (QID) | ORAL | 0 refills | Status: AC | PRN
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
## 2020-03-01 ENCOUNTER — Encounter: Payer: Self-pay | Admitting: Family Medicine

## 2020-03-04 DEATH — deceased

## 2020-03-12 IMAGING — CT CT HEAD W/O CM
3 series · 16 of 47 positions shown, 19 images · non-contrast
Comparison: CT 02/21/2017

CLINICAL DATA: Acute alteration in mental status

EXAM:
CT HEAD WITHOUT CONTRAST
TECHNIQUE: Contiguous axial images were obtained from the base of the skull
through the vertex without intravenous contrast.

[Series 2: head wo · axial · 0.46mm/px · z∈[-140,-15]mm · 10 of 31 slices shown, 13 images]
[im 3/31  brain]
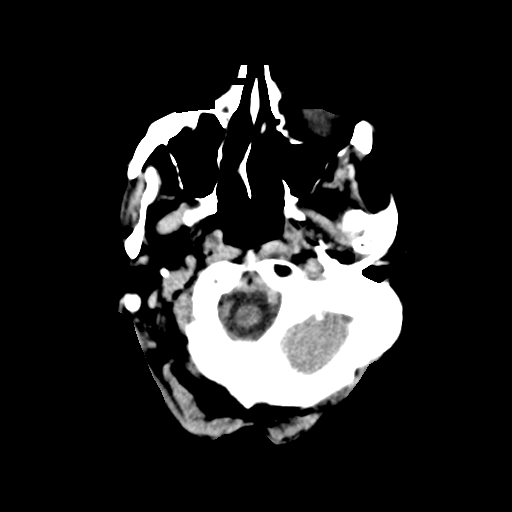
[im 3/31  bone]
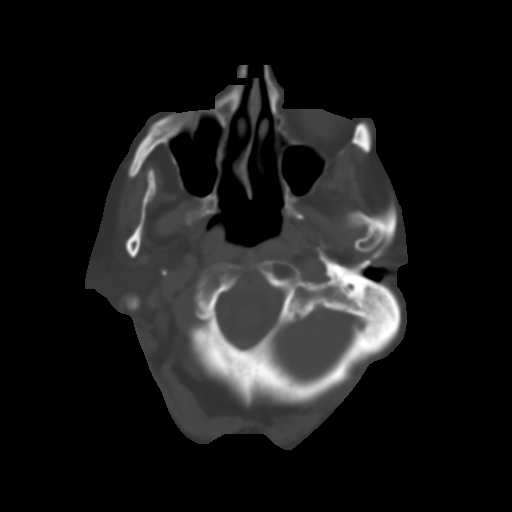
[im 6/31  brain]
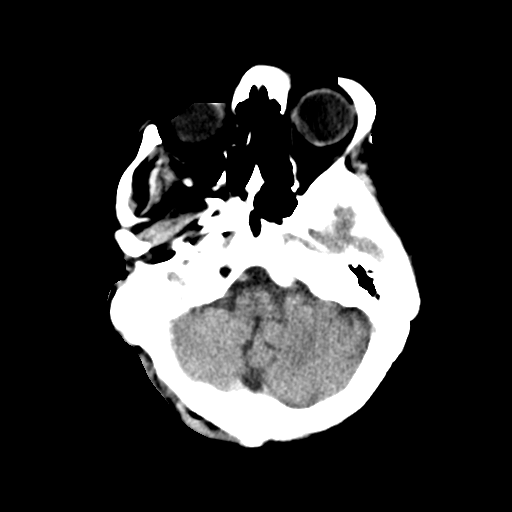
[im 9/31  brain]
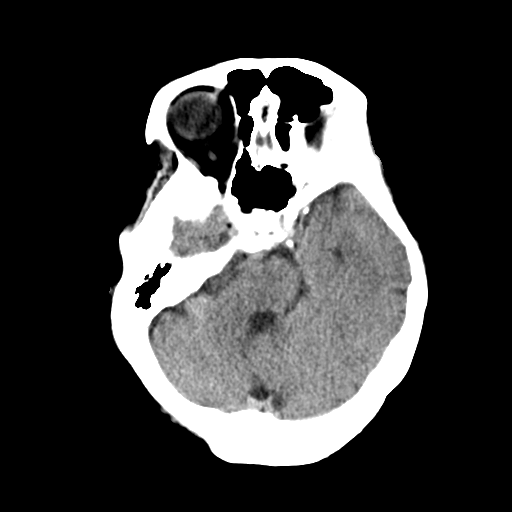
[im 11/31  brain]
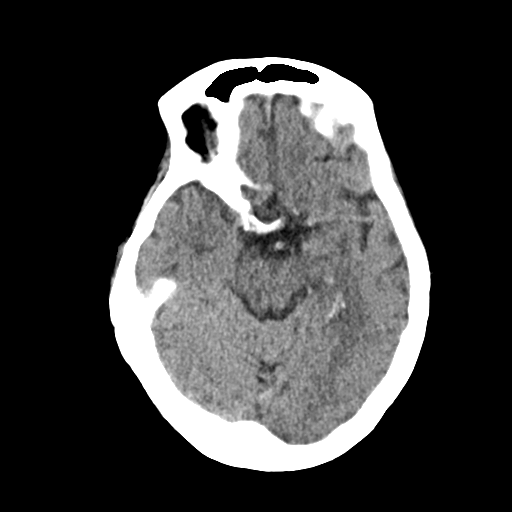
[im 14/31  brain]
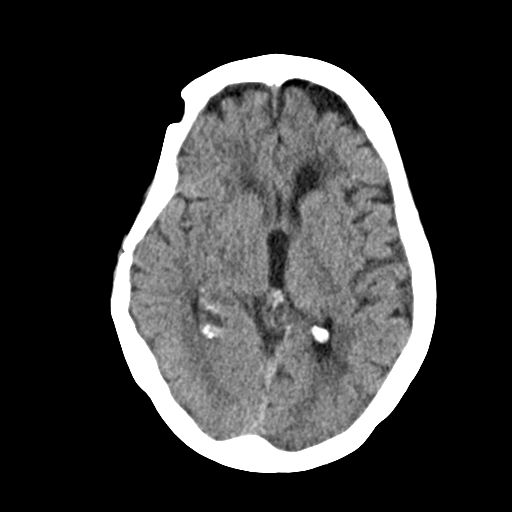
[im 14/31  bone]
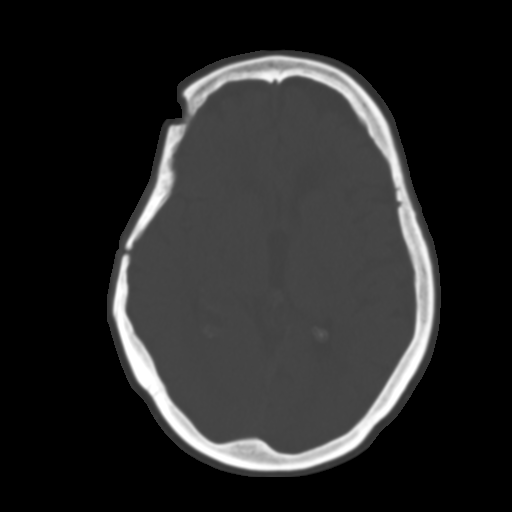
[im 17/31  brain]
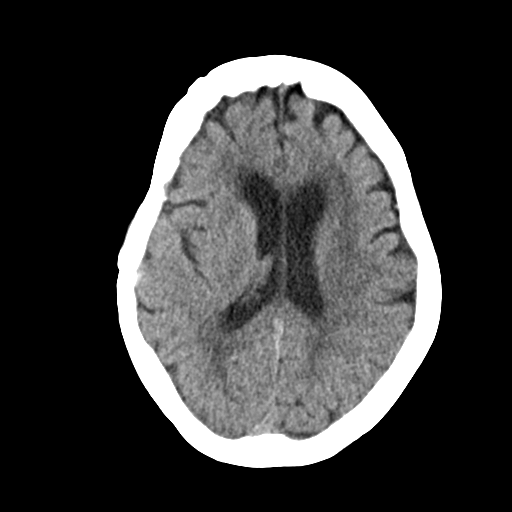
[im 20/31  brain]
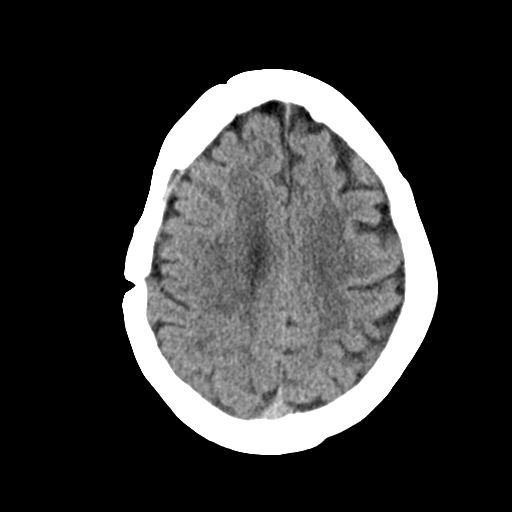
[im 23/31  brain]
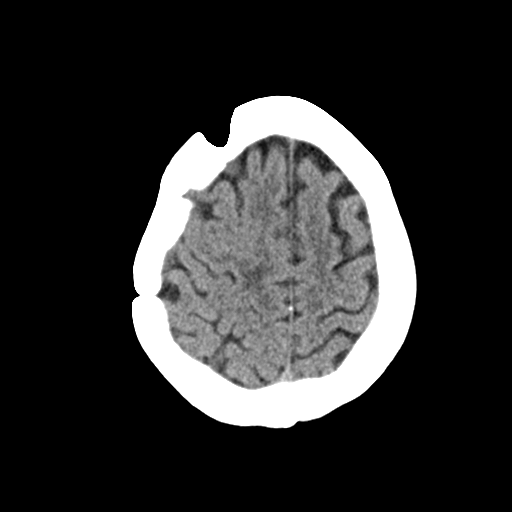
[im 25/31  brain]
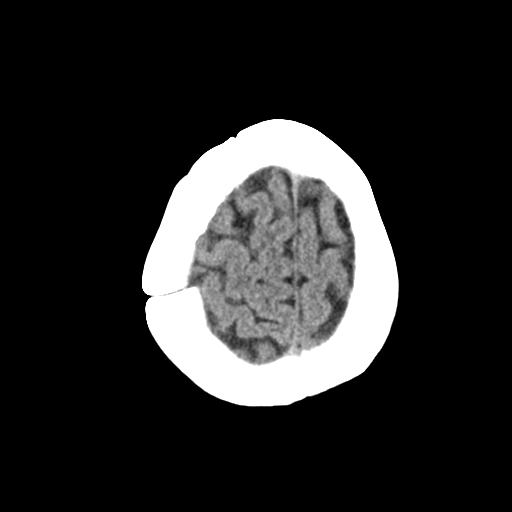
[im 25/31  bone]
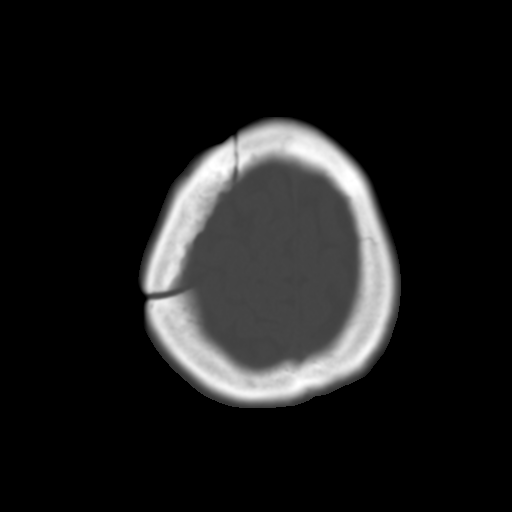
[im 28/31  brain]
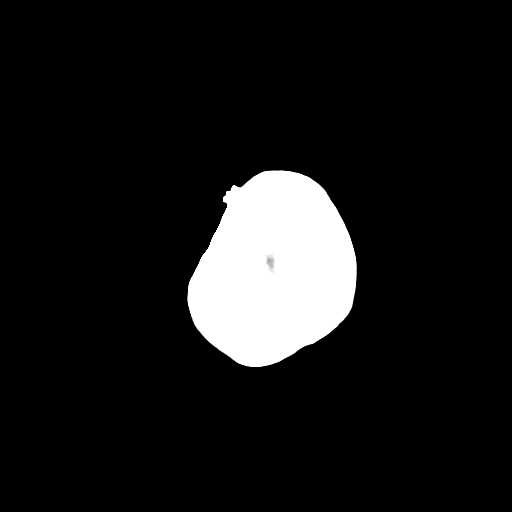

[Series 4: coronal soft · coronal · 0.32mm/px · 3 of 67 slices shown]
[im 23/67  brain]
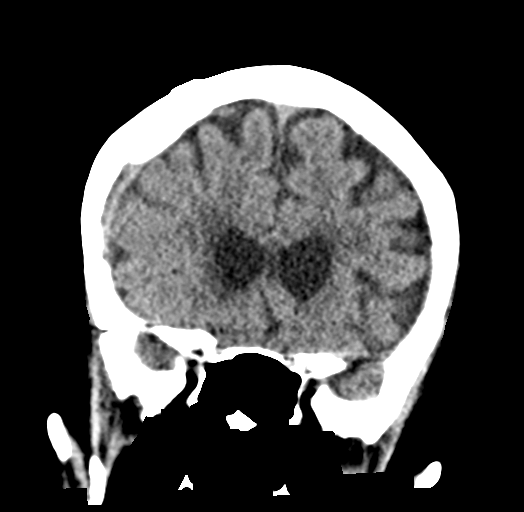
[im 30/67  brain]
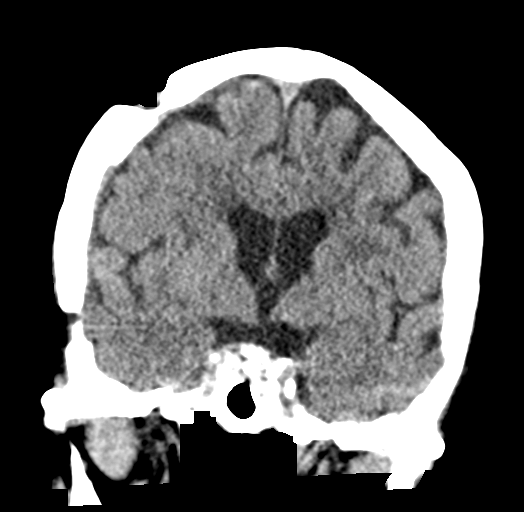
[im 37/67  brain]
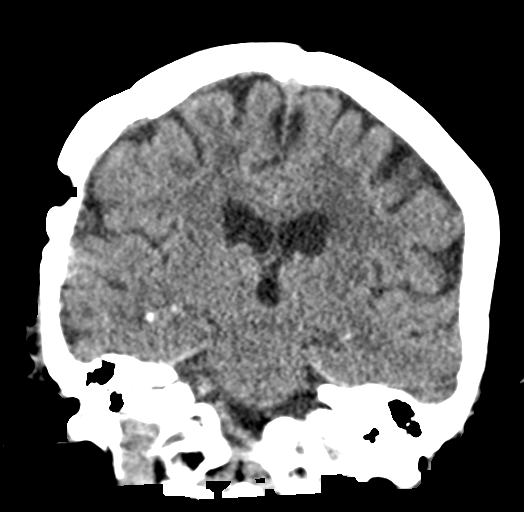

[Series 5: sag soft · sagittal · 0.30mm/px · 3 of 53 slices shown]
[im 18/53  brain]
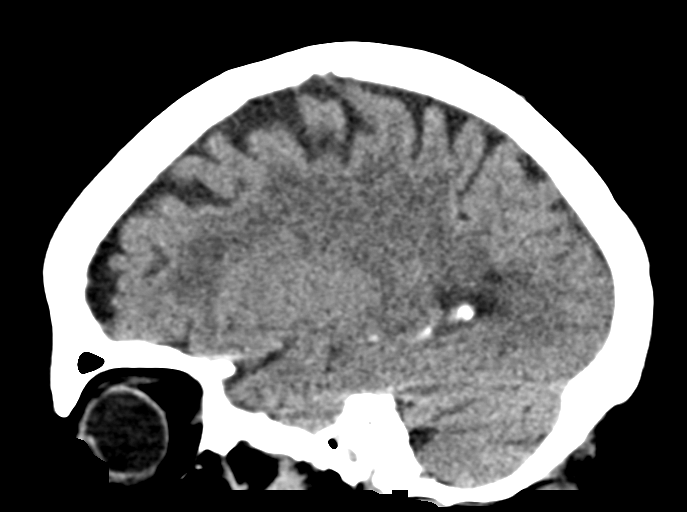
[im 27/53  brain]
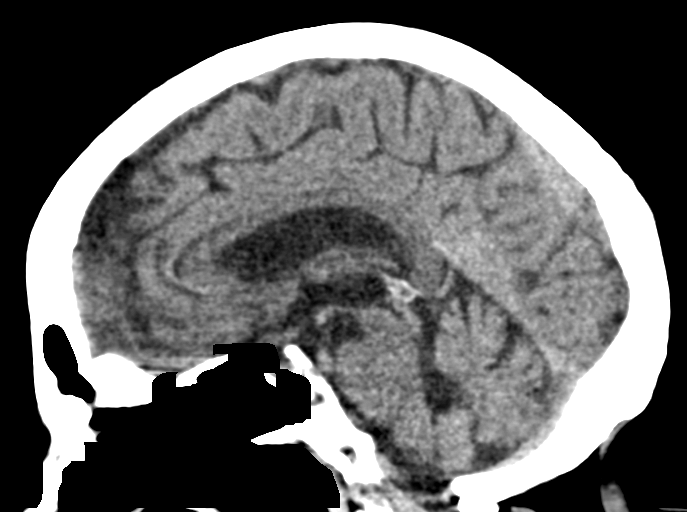
[im 35/53  brain]
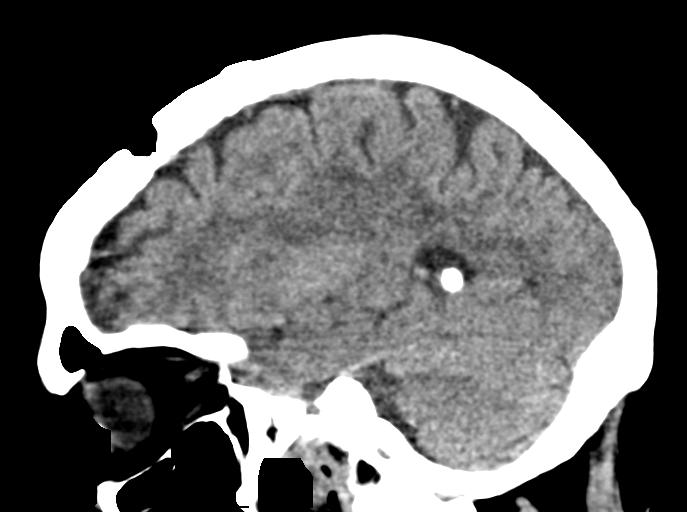

[16 of 47 positions shown; findings below may reference images not displayed]

FINDINGS: Brain: Moderate atrophy and moderate chronic microvascular ischemic
change throughout the white matter unchanged. No acute infarct.
Negative for acute hemorrhage or mass. No midline shift.

Vascular: Negative for hyperdense vessel

Skull: Right frontal craniotomy for subdural. No acute skeletal
abnormality

Sinuses/Orbits: Paranasal sinuses clear. Bilateral cataract surgery.

Other: None
IMPRESSION: No acute intracranial abnormality. Atrophy and chronic microvascular
ischemia unchanged.

## 2020-05-25 ENCOUNTER — Encounter: Payer: Self-pay | Admitting: Internal Medicine
